# Patient Record
Sex: Male | Born: 1975 | Race: Black or African American | Hispanic: No | Marital: Single | Smoking: Current every day smoker
Health system: Southern US, Community
[De-identification: ages and names within clinical notes are randomized; demographics above are authoritative.]

## PROBLEM LIST (undated history)

## (undated) DIAGNOSIS — F209 Schizophrenia, unspecified: Secondary | ICD-10-CM

## (undated) DIAGNOSIS — K5190 Ulcerative colitis, unspecified, without complications: Secondary | ICD-10-CM

## (undated) DIAGNOSIS — F419 Anxiety disorder, unspecified: Secondary | ICD-10-CM

## (undated) DIAGNOSIS — M869 Osteomyelitis, unspecified: Secondary | ICD-10-CM

## (undated) DIAGNOSIS — D649 Anemia, unspecified: Secondary | ICD-10-CM

## (undated) HISTORY — DX: Ulcerative colitis, unspecified, without complications: K51.90

## (undated) HISTORY — DX: Anxiety disorder, unspecified: F41.9

## (undated) HISTORY — DX: Anemia, unspecified: D64.9

---

## 2001-10-07 HISTORY — PX: TOTAL COLECTOMY: SHX852

## 2005-04-15 ENCOUNTER — Other Ambulatory Visit: Payer: Self-pay

## 2005-04-15 ENCOUNTER — Emergency Department: Payer: Self-pay | Admitting: Internal Medicine

## 2008-08-04 ENCOUNTER — Emergency Department: Payer: Self-pay | Admitting: Emergency Medicine

## 2014-10-07 HISTORY — PX: SMALL INTESTINE SURGERY: SHX150

## 2015-01-13 DIAGNOSIS — R000 Tachycardia, unspecified: Secondary | ICD-10-CM

## 2015-01-13 DIAGNOSIS — F203 Undifferentiated schizophrenia: Secondary | ICD-10-CM | POA: Insufficient documentation

## 2015-01-13 DIAGNOSIS — K5100 Ulcerative (chronic) pancolitis without complications: Secondary | ICD-10-CM | POA: Insufficient documentation

## 2015-01-13 HISTORY — DX: Tachycardia, unspecified: R00.0

## 2016-02-11 ENCOUNTER — Inpatient Hospital Stay
Admit: 2016-02-11 | Discharge: 2016-02-16 | DRG: 885 | Disposition: A | Discharge status: Home / Self Care | Payer: Medicaid Other | Source: Ambulatory Visit | Attending: Psychiatry | Admitting: Psychiatry

## 2016-02-11 ENCOUNTER — Encounter: Payer: Self-pay | Admitting: *Deleted

## 2016-02-11 ENCOUNTER — Emergency Department
Admission: EM | Admit: 2016-02-11 | Discharge: 2016-02-11 | Disposition: A | Discharge status: Other Acute Inpatient Hospital | Payer: Medicaid Other | Attending: Emergency Medicine | Admitting: Emergency Medicine

## 2016-02-11 DIAGNOSIS — R45850 Homicidal ideations: Secondary | ICD-10-CM | POA: Diagnosis not present

## 2016-02-11 DIAGNOSIS — F17210 Nicotine dependence, cigarettes, uncomplicated: Secondary | ICD-10-CM | POA: Diagnosis present

## 2016-02-11 DIAGNOSIS — F250 Schizoaffective disorder, bipolar type: Secondary | ICD-10-CM | POA: Diagnosis present

## 2016-02-11 DIAGNOSIS — F918 Other conduct disorders: Secondary | ICD-10-CM | POA: Insufficient documentation

## 2016-02-11 DIAGNOSIS — F17200 Nicotine dependence, unspecified, uncomplicated: Secondary | ICD-10-CM | POA: Insufficient documentation

## 2016-02-11 DIAGNOSIS — R000 Tachycardia, unspecified: Secondary | ICD-10-CM | POA: Diagnosis not present

## 2016-02-11 DIAGNOSIS — R451 Restlessness and agitation: Secondary | ICD-10-CM | POA: Diagnosis present

## 2016-02-11 DIAGNOSIS — R4689 Other symptoms and signs involving appearance and behavior: Secondary | ICD-10-CM

## 2016-02-11 LAB — CBC
HCT: 46.7 % (ref 40.0–52.0)
HEMOGLOBIN: 15.6 g/dL (ref 13.0–18.0)
MCH: 30.1 pg (ref 26.0–34.0)
MCHC: 33.4 g/dL (ref 32.0–36.0)
MCV: 90.3 fL (ref 80.0–100.0)
PLATELETS: 201 10*3/uL (ref 150–440)
RBC: 5.17 MIL/uL (ref 4.40–5.90)
RDW: 13.1 % (ref 11.5–14.5)
WBC: 8.6 10*3/uL (ref 3.8–10.6)

## 2016-02-11 LAB — COMPREHENSIVE METABOLIC PANEL
ALBUMIN: 3.8 g/dL (ref 3.5–5.0)
ALK PHOS: 66 U/L (ref 38–126)
ALT: 17 U/L (ref 17–63)
ANION GAP: 8 (ref 5–15)
AST: 20 U/L (ref 15–41)
BUN: 10 mg/dL (ref 6–20)
CALCIUM: 9 mg/dL (ref 8.9–10.3)
CHLORIDE: 105 mmol/L (ref 101–111)
CO2: 27 mmol/L (ref 22–32)
CREATININE: 1.01 mg/dL (ref 0.61–1.24)
GFR calc Af Amer: >60 mL/min (ref >60)
GFR calc non Af Amer: >60 mL/min (ref >60)
GLUCOSE: 114 mg/dL — AB (ref 65–99)
Potassium: 3.7 mmol/L (ref 3.5–5.1)
SODIUM: 140 mmol/L (ref 135–145)
Total Bilirubin: 1 mg/dL (ref 0.3–1.2)
Total Protein: 7.9 g/dL (ref 6.5–8.1)

## 2016-02-11 LAB — ACETAMINOPHEN LEVEL: Acetaminophen (Tylenol), Serum: <10 ug/mL — ABNORMAL LOW (ref 10–30)

## 2016-02-11 LAB — URINE DRUG SCREEN, QUALITATIVE (ARMC ONLY)
Amphetamines, Ur Screen: NOT DETECTED
Barbiturates, Ur Screen: NOT DETECTED
Benzodiazepine, Ur Scrn: NOT DETECTED
Cannabinoid 50 Ng, Ur ~~LOC~~: NOT DETECTED
Cocaine Metabolite,Ur ~~LOC~~: NOT DETECTED
MDMA (Ecstasy)Ur Screen: NOT DETECTED
Methadone Scn, Ur: NOT DETECTED
Opiate, Ur Screen: NOT DETECTED
Phencyclidine (PCP) Ur S: NOT DETECTED
Tricyclic, Ur Screen: NOT DETECTED

## 2016-02-11 LAB — SALICYLATE LEVEL: Salicylate Lvl: <4 mg/dL (ref 2.8–30.0)

## 2016-02-11 LAB — ETHANOL: Alcohol, Ethyl (B): <5 mg/dL (ref <5)

## 2016-02-11 MED ORDER — TRAZODONE HCL 100 MG PO TABS
100.0000 mg | ORAL_TABLET | Freq: Every day | ORAL | Status: DC
  Administered 2016-02-11 – 2016-02-15 (×5): 100 mg via ORAL
  Filled 2016-02-11 (×5): qty 1

## 2016-02-11 MED ORDER — ARIPIPRAZOLE ER 400 MG IM SUSR: 400.0000 mg | INTRAMUSCULAR | Status: DC

## 2016-02-11 MED ORDER — PROPRANOLOL HCL 20 MG PO TABS
20.0000 mg | ORAL_TABLET | Freq: Two times a day (BID) | ORAL | Status: DC
  Administered 2016-02-11 – 2016-02-16 (×10): 20 mg via ORAL
  Filled 2016-02-11 (×10): qty 1

## 2016-02-11 MED ORDER — ACETAMINOPHEN 325 MG PO TABS: 650.0000 mg | ORAL_TABLET | Freq: Four times a day (QID) | ORAL | Status: DC | PRN

## 2016-02-11 MED ORDER — BENZTROPINE MESYLATE 1 MG PO TABS
2.0000 mg | ORAL_TABLET | Freq: Every day | ORAL | Status: DC
  Administered 2016-02-11 – 2016-02-12 (×2): 2 mg via ORAL
  Filled 2016-02-11 (×2): qty 2

## 2016-02-11 MED ORDER — ALUM & MAG HYDROXIDE-SIMETH 200-200-20 MG/5ML PO SUSP: 30.0000 mL | ORAL | Status: DC | PRN

## 2016-02-11 MED ORDER — ARIPIPRAZOLE 10 MG PO TABS
10.0000 mg | ORAL_TABLET | Freq: Every day | ORAL | Status: DC
  Administered 2016-02-11 – 2016-02-13 (×3): 10 mg via ORAL
  Filled 2016-02-11 (×3): qty 1

## 2016-02-11 MED ORDER — MAGNESIUM HYDROXIDE 400 MG/5ML PO SUSP: 30.0000 mL | Freq: Every day | ORAL | Status: DC | PRN

## 2016-02-11 NOTE — ED Notes (Signed)
D: Pt denies SI/HI/AVH. Pt is pleasant and cooperative. Pt stated he was ok for now. Pt observed sitting in room watching TV.   A: Pt was offered support and encouragement.Q 15 minute checks were done for safety.  R: Pt has no complaints.Pt receptive to treatment and safety maintained on unit.

## 2016-02-11 NOTE — Tx Team (Signed)
Initial Interdisciplinary Treatment Plan   PATIENT STRESSORS: being here.    PATIENT STRENGTHS: Curator fund of knowledge Physical Health   PROBLEM LIST: Problem List/Patient Goals Date to be addressed Date deferred Reason deferred Estimated date of resolution  Agression 02-11-16           " I don't know why I am here" 02-11-16                                          DISCHARGE CRITERIA:  Improved stabilization in mood, thinking, and/or behavior Motivation to continue treatment in a less acute level of care  PRELIMINARY DISCHARGE PLAN: Outpatient therapy  PATIENT/FAMIILY INVOLVEMENT: This treatment plan has been presented to and reviewed with the patient, Christian Lamb, and/or family member, .  The patient and family have been given the opportunity to ask questions and make suggestions.  Amie [place] 02/11/2016, 11:01 PM

## 2016-02-11 NOTE — ED Notes (Signed)
[place] [phone]

## 2016-02-11 NOTE — Consult Note (Signed)
[facility] Consult   Reason for Consult:  Aggressive behavior. Referring Physician:  Dr. Mariea Clonts Patient Identification: Christian Lamb MRN:  [id number] Principal Diagnosis: Schizoaffective disorder, bipolar type [facility]) Diagnosis:   Patient Active Problem List   Diagnosis Date Noted  . Schizoaffective disorder, bipolar type ([place]) [F25.0] 02/11/2016    Total Time spent with patient: 1 hour  Subjective:    Christian Lamb is a 40 y.o. male patient admitted with aggressive behavior.  Identifying data. Christian Lamb is a 40 year old male with a history of schizophrenia.  Chief complaint. "I don't know what I'm doing here."  History of present illness. Information was obtained from the patient and the chart. The patient has a history of schizophrenia for the last 20. He has been living with the same caregiver for all the time. His caregiver is also his payee. It is unclear whether this is in private support group home setting. The patient reports good compliance with medications prescribed by [facility] in [place]. He has been maintained on injections of Abilify Maintena with Cogentin and propranolol. His Abilify injection was 2 weeks ago. The patient denies any problems at home or with the caregiver. However reported that the patient was threatening, wanted him dead unless he is allowed to drive her car. The patient says that he has been helping his To take care of his mother and was supposed to be allowed to drive a car. The patient himself denies any symptoms of depression, anxiety, or psychosis. He denies symptoms suggestive of bipolar mania. He is not suicidal or homicidal. On interview however the patient is rather restless, has pressured speech and disorganized thinking suggestive of hypomanic episode. He denies alcohol or illicit substance use. He was negative for substances on admission.  Past psychiatric history. He was reportedly hospitalized once and diagnosed  with schizophrenia. He denies ever attempting suicide.  Family psychiatric history. His mother had mental illness.  Social history. He is disabled from mental illness. He lives with his caregiver.  Risk to Self: Suicidal Ideation: No Suicidal Intent: No Is patient at risk for suicide?: No Suicidal Plan?: No Access to Means: No What has been your use of drugs/alcohol within the last 12 months?: NA How many times?: 0 Other Self Harm Risks: NA Triggers for Past Attempts: None known Intentional Self Injurious Behavior: None Risk to Others: Homicidal Ideation: No Thoughts of Harm to Others: No Current Homicidal Intent: No Current Homicidal Plan: No Access to Homicidal Means: No Identified Victim: NA History of harm to others?: No Assessment of Violence: None Noted Violent Behavior Description: Calm and cooperative Does patient have access to weapons?: No Criminal Charges Pending?: No Does patient have a court date: No Prior Inpatient Therapy: Prior Inpatient Therapy: Yes Prior Therapy Dates: Unable to remember Prior Therapy Facilty/Provider(s): Patient is unable to remember Reason for Treatment: Patient is unable to remember Prior Outpatient Therapy: Prior Outpatient Therapy: Yes Prior Therapy Dates: Patient is unable to remember Prior Therapy Facilty/Provider(s): Patient is unable to remember Reason for Treatment: Patienr is unable to remember Does patient have an ACCT team?: No Does patient have Intensive In-House Services?  : No Does patient have [facility] services? : No Does patient have P4CC services?: No  Past Medical History: History reviewed. No pertinent past medical history. History reviewed. No pertinent past surgical history. Family History: History reviewed. No pertinent family history. Family Psychiatric  History: Mother with mental illness. Social History:  History  Alcohol Use: Not on file  History  Drug Use Not on file    Social History   Social  History  . Marital Status: Single    Spouse Name: N/A  . Number of Children: N/A  . Years of Education: N/A   Social History Main Topics  . Smoking status: None  . Smokeless tobacco: None  . Alcohol Use: None  . Drug Use: None  . Sexual Activity: Not Asked   Other Topics Concern  . None   Social History Narrative  . None   Additional Social History:    Allergies:  No Known Allergies  Labs:  Results for orders placed or performed during the hospital encounter of 02/11/16 (from the past 48 hour(s))  Comprehensive metabolic panel     Status: Abnormal   Collection Time: 02/11/16  2:50 PM  Result Value Ref Range   Sodium 140 135 - 145 mmol/L   Potassium 3.7 3.5 - 5.1 mmol/L   Chloride 105 101 - 111 mmol/L   CO2 27 22 - 32 mmol/L   Glucose, Bld 114 (H) 65 - 99 mg/dL   BUN 10 6 - 20 mg/dL   Creatinine, Ser 1.01 0.61 - 1.24 mg/dL   Calcium 9.0 8.9 - 10.3 mg/dL   Total Protein 7.9 6.5 - 8.1 g/dL   Albumin 3.8 3.5 - 5.0 g/dL   AST 20 15 - 41 U/L   ALT 17 17 - 63 U/L   Alkaline Phosphatase 66 38 - 126 U/L   Total Bilirubin 1.0 0.3 - 1.2 mg/dL   GFR calc non Af Amer >60 >60 mL/min   GFR calc Af Amer >60 >60 mL/min    Comment: (NOTE) The eGFR has been calculated using the CKD EPI equation. This calculation has not been validated in all clinical situations. eGFR's persistently <60 mL/min signify possible Chronic Kidney Disease.    Anion gap 8 5 - 15  Ethanol     Status: None   Collection Time: 02/11/16  2:50 PM  Result Value Ref Range   Alcohol, Ethyl (B) <5 <5 mg/dL    Comment:        LOWEST DETECTABLE LIMIT FOR SERUM ALCOHOL IS 5 mg/dL FOR MEDICAL PURPOSES ONLY   Salicylate level     Status: None   Collection Time: 02/11/16  2:50 PM  Result Value Ref Range   Salicylate Lvl <6.5 2.8 - 30.0 mg/dL  Acetaminophen level     Status: Abnormal   Collection Time: 02/11/16  2:50 PM  Result Value Ref Range   Acetaminophen (Tylenol), Serum <10 (L) 10 - 30 ug/mL     Comment:        THERAPEUTIC CONCENTRATIONS VARY SIGNIFICANTLY. A RANGE OF 10-30 ug/mL MAY BE AN EFFECTIVE CONCENTRATION FOR MANY PATIENTS. HOWEVER, SOME ARE BEST TREATED AT CONCENTRATIONS OUTSIDE THIS RANGE. ACETAMINOPHEN CONCENTRATIONS >150 ug/mL AT 4 HOURS AFTER INGESTION AND >50 ug/mL AT 12 HOURS AFTER INGESTION ARE OFTEN ASSOCIATED WITH TOXIC REACTIONS.   cbc     Status: None   Collection Time: 02/11/16  2:50 PM  Result Value Ref Range   WBC 8.6 3.8 - 10.6 K/uL   RBC 5.17 4.40 - 5.90 MIL/uL   Hemoglobin 15.6 13.0 - 18.0 g/dL   HCT 46.7 40.0 - 52.0 %   MCV 90.3 80.0 - 100.0 fL   MCH 30.1 26.0 - 34.0 pg   MCHC 33.4 32.0 - 36.0 g/dL   RDW 13.1 11.5 - 14.5 %   Platelets 201 150 - 440 K/uL  Urine  Drug Screen, Qualitative     Status: None   Collection Time: 02/11/16  2:50 PM  Result Value Ref Range   Tricyclic, Ur Screen NONE DETECTED NONE DETECTED   Amphetamines, Ur Screen NONE DETECTED NONE DETECTED   MDMA (Ecstasy)Ur Screen NONE DETECTED NONE DETECTED   Cocaine Metabolite,Ur [place] NONE DETECTED NONE DETECTED   Opiate, Ur Screen NONE DETECTED NONE DETECTED   Phencyclidine (PCP) Ur S NONE DETECTED NONE DETECTED   Cannabinoid 50 Ng, Ur [place] NONE DETECTED NONE DETECTED   Barbiturates, Ur Screen NONE DETECTED NONE DETECTED   Benzodiazepine, Ur Scrn NONE DETECTED NONE DETECTED   Methadone Scn, Ur NONE DETECTED NONE DETECTED    Comment: (NOTE) 681  Tricyclics, urine               Cutoff 1000 ng/mL 200  Amphetamines, urine             Cutoff 1000 ng/mL 300  MDMA (Ecstasy), urine           Cutoff 500 ng/mL 400  Cocaine Metabolite, urine       Cutoff 300 ng/mL 500  Opiate, urine                   Cutoff 300 ng/mL 600  Phencyclidine (PCP), urine      Cutoff 25 ng/mL 700  Cannabinoid, urine              Cutoff 50 ng/mL 800  Barbiturates, urine             Cutoff 200 ng/mL 900  Benzodiazepine, urine           Cutoff 200 ng/mL 1000 Methadone, urine                Cutoff 300  ng/mL 1100 1200 The urine drug screen provides only a preliminary, unconfirmed 1300 analytical test result and should not be used for non-medical 1400 purposes. Clinical consideration and professional judgment should 1500 be applied to any positive drug screen result due to possible 1600 interfering substances. A more specific alternate chemical method 1700 must be used in order to obtain a confirmed analytical result.  1800 Gas chromato graphy / mass spectrometry (GC/MS) is the preferred 1900 confirmatory method.     No current facility-administered medications for this encounter.   No current outpatient prescriptions on file.    Musculoskeletal: Strength & Muscle Tone: within normal limits Gait & Station: normal Patient leans: N/A  Psychiatric Specialty Exam: I reviewed physical exam performed in the emergency room and agree with the findings Review of Systems  All other systems reviewed and are negative.   Blood pressure 140/90, pulse 90, temperature 98.2 F (36.8 C), temperature source Oral, resp. rate 18, height 6' (1.829 m), weight 61.689 kg (136 lb), SpO2 100 %.Body mass index is 18.44 kg/(m^2).  General Appearance: Disheveled  Eye Sport and exercise psychologist::  Fair  Speech:  Pressured  Volume:  Increased  Mood:  Dysphoric  Affect:  Labile  Thought Process:  Disorganized  Orientation:  Full (Time, Place, and Person)  Thought Content:  Delusions and Paranoid Ideation  Suicidal Thoughts:  No  Homicidal Thoughts:  No  Memory:  Immediate;   Fair Recent;   Fair Remote;   Fair  Judgement:  Poor  Insight:  Lacking  Psychomotor Activity:  Increased  Concentration:  Fair  Recall:  [place]  Language: Fair  Akathisia:  No  Handed:  Right  AIMS (if indicated):  Assets:  Communication Skills Desire for Improvement Financial Resources/Insurance Housing Physical Health Resilience Social Support  ADL's:  Intact  Cognition: WNL  Sleep:      Treatment Plan  Summary: Daily contact with patient to assess and evaluate symptoms and progress in treatment and Medication management   Christian Lamb is a 40 year old male with history of schizophrenia brought to the hospital by his caregiver for threatening aggressive behavior in what appears to be a manic episode.  PLAN:  1. Will admit the patient to behavioral medicine when bed available.  2. I will start oral Abilify, Cogentin, and propranolol.  Disposition: Recommend psychiatric Inpatient admission when medically cleared. Supportive therapy provided about ongoing stressors. Discussed crisis plan, support from social network, calling 911, coming to the Emergency Department, and calling Suicide Hotline.  Orson Slick, MD 02/11/2016 7:37 PM

## 2016-02-11 NOTE — BH Specialist Note (Signed)
Writer attempted to obtain collateral information from the care giver at the group home without success.  Writer left a voice mail message for the patient at [phone] and [phone].

## 2016-02-11 NOTE — BH Assessment (Addendum)
Assessment Note  Christian Lamb is an 40 y.o. male that resides in a group home.  Patient was dropped off by his caregiver due aggressive behavior.  Patient denies SI/HI/Psychosis/Substance Abuse.  Patient reports previous inpatient psychiatric hospitalization but cannot remember when or the name of the facilities. Writer was unable to obtain collateral information from the group home.  Writer left voice mail messages with the numbers listed in the epic chart.    Patient was cam and cooperative during the assessment.  Patient denies making any statement that he was going to harm anyone.  Patient denies harming anyone. Patient denies physical, sexual or emotional abuse.   Diagnosis: Schizophrenia  Past Medical History: History reviewed. No pertinent past medical history.  No past surgical history on file.  Family History: History reviewed. No pertinent family history.  Social History:  has no tobacco, alcohol, and drug history on file.  Additional Social History:  Alcohol / Drug Use History of alcohol / drug use?: No history of alcohol / drug abuse  CIWA: CIWA-Ar BP: 140/90 mmHg Pulse Rate: 90 COWS:    Allergies: No Known Allergies  Home Medications:  (Not in a hospital admission)  OB/GYN Status:  No LMP for male patient.  General Assessment Data Location of Assessment: [facility] ED TTS Assessment: In system Is this a Tele or Face-to-Face Assessment?: Face-to-Face Is this an Initial Assessment or a Re-assessment for this encounter?: Initial Assessment Marital status: Single Maiden name: NA Is patient pregnant?: No Pregnancy Status: No Living Arrangements: Group Home Can pt return to current living arrangement?: Yes Admission Status: Involuntary Is patient capable of signing voluntary admission?: No Referral Source: Other (His payee [name]) Insurance type: Medicaid     Crisis Care Plan Living Arrangements: Group Home Legal Guardian: Other: Name of  Psychiatrist: [place] Name of Therapist: Group Home - Therapist  Education Status Is patient currently in school?: No Current Grade: NA Highest grade of school patient has completed: NA Name of school: NA Contact person: NA  Risk to self with the past 6 months Suicidal Ideation: No Has patient been a risk to self within the past 6 months prior to admission? : No Suicidal Intent: No Has patient had any suicidal intent within the past 6 months prior to admission? : No Is patient at risk for suicide?: No Suicidal Plan?: No Has patient had any suicidal plan within the past 6 months prior to admission? : No Access to Means: No What has been your use of drugs/alcohol within the last 12 months?: NA Previous Attempts/Gestures: No How many times?: 0 Other Self Harm Risks: NA Triggers for Past Attempts: None known Intentional Self Injurious Behavior: None Family Suicide History: No Recent stressful life event(s):  (None Reported) Persecutory voices/beliefs?: No Depression: No Depression Symptoms:  (None Reported) Substance abuse history and/or treatment for substance abuse?: No Suicide prevention information given to non-admitted patients: Not applicable  Risk to Others within the past 6 months Homicidal Ideation: No Does patient have any lifetime risk of violence toward others beyond the six months prior to admission? : No Thoughts of Harm to Others: No Current Homicidal Intent: No Current Homicidal Plan: No Access to Homicidal Means: No Identified Victim: NA History of harm to others?: No Assessment of Violence: None Noted Violent Behavior Description: Calm and cooperative Does patient have access to weapons?: No Criminal Charges Pending?: No Does patient have a court date: No Is patient on probation?: No  Psychosis Hallucinations: None noted Delusions: None  noted  Mental Status Report Appearance/Hygiene: Disheveled Eye Contact: Good Motor Activity:  Freedom of movement Speech: Logical/coherent Level of Consciousness: Alert Mood: Anxious Affect: Appropriate to circumstance Anxiety Level: Minimal Thought Processes: Relevant, Coherent Judgement: Unimpaired Orientation: Person, Time, Place, Situation Obsessive Compulsive Thoughts/Behaviors: None  Cognitive Functioning Concentration: Decreased Memory: Recent Intact, Remote Intact IQ: Average Insight: Fair Impulse Control: Fair Appetite: Fair Weight Loss: 0 Weight Gain: 0 Sleep: Decreased Total Hours of Sleep: 5 Vegetative Symptoms: None  ADLScreening [facility] Assessment Services) Patient's cognitive ability adequate to safely complete daily activities?: Yes Patient able to express need for assistance with ADLs?: Yes Independently performs ADLs?: Yes (appropriate for developmental age)  Prior Inpatient Therapy Prior Inpatient Therapy: Yes Prior Therapy Dates: Unable to remember Prior Therapy Facilty/Provider(s): Patient is unable to remember Reason for Treatment: Patient is unable to remember  Prior Outpatient Therapy Prior Outpatient Therapy: Yes Prior Therapy Dates: Patient is unable to remember Prior Therapy Facilty/Provider(s): Patient is unable to remember Reason for Treatment: Patienr is unable to remember Does patient have an ACCT team?: No Does patient have Intensive In-House Services?  : No Does patient have [facility] services? : No Does patient have P4CC services?: No  ADL Screening (condition at time of admission) Patient's cognitive ability adequate to safely complete daily activities?: Yes Is the patient deaf or have difficulty hearing?: No Does the patient have difficulty seeing, even when wearing glasses/contacts?: No Does the patient have difficulty concentrating, remembering, or making decisions?: Yes Patient able to express need for assistance with ADLs?: Yes Does the patient have difficulty dressing or bathing?: No Independently performs ADLs?: Yes  (appropriate for developmental age) Does the patient have difficulty walking or climbing stairs?: No Weakness of Legs: None Weakness of Arms/Hands: None  Home Assistive Devices/Equipment Home Assistive Devices/Equipment: None    Abuse/Neglect Assessment (Assessment to be complete while patient is alone) Physical Abuse: Denies Verbal Abuse: Denies Sexual Abuse: Denies Exploitation of patient/patient's resources: Denies Self-Neglect: Denies Values / Beliefs Cultural Requests During Hospitalization: None Spiritual Requests During Hospitalization: None Consults Spiritual Care Consult Needed: No Social Work Consult Needed: No Regulatory affairs officer (For Healthcare) Does patient have an advance directive?: No Would patient like information on creating an advanced directive?: No - patient declined information Type of Advance Directive: Healthcare Power of Attorney Copy of advanced directive(s) in chart?: No - copy requested    Additional Information 1:1 In Past 12 Months?: No CIRT Risk: No Elopement Risk: No Does patient have medical clearance?: No     Disposition: Pending psych disposition.  Disposition Initial Assessment Completed for this Encounter: Yes  On Site Evaluation by:   Reviewed with Physician:    Graciella Freer [place] 02/11/2016 4:33 PM

## 2016-02-11 NOTE — BH Assessment (Signed)
Writer received a call from [phone] this is not a valid phone number for [employer]

## 2016-02-11 NOTE — ED Notes (Addendum)
Caregiver states pt has been demonstrating aggressive behavior, states he told him he wanted him dead, states he wants his driving license and a car and gets mad when he cant, denies SI or HI

## 2016-02-11 NOTE — BH Assessment (Addendum)
Dr. Bary Leriche called to state that once Behavioral Unit had a bed downstairs she would like the patient to be admitted for services   Pt accept to Dr. Lemmie Evens to Bed 307

## 2016-02-11 NOTE — ED Provider Notes (Signed)
[facility] Emergency Department Provider Note  ____________________________________________  Time seen: Approximately 3:18 PM  I have reviewed the triage vital signs and the nursing notes.   HISTORY  Chief Complaint Aggressive Behavior    HPI Christian Lamb is a 40 y.o. male with a history of schizophrenia brought by his caregiver in the group home for aggressive behavior. The patient denies saying anything that would be threatening, and states "I was just sitting in my room and having a conversation.". Per report, the patient told his caregiver that he wanted him dead and that the patient is angry because he cannot have his driver's license her car. At this time, the patient is denying any SI, HI or hallucinations. The patient does not have any acute medical complaints.   History reviewed. No pertinent past medical history.  There are no active problems to display for this patient.   No past surgical history on file.  No current outpatient prescriptions on file.  Allergies Review of patient's allergies indicates no known allergies.  History reviewed. No pertinent family history.  Social History Social History  Substance Use Topics  . Smoking status: None  . Smokeless tobacco: None  . Alcohol Use: None    Review of Systems Constitutional: No fever/chills.No lightheadedness or syncope. Eyes: No visual changes. ENT: No sore throat. No congestion or rhinorrhea. Cardiovascular: Denies chest pain. Denies palpitations. Respiratory: Denies shortness of breath.  No cough. Gastrointestinal: No abdominal pain.  No nausea, no vomiting.  No diarrhea.  No constipation. Genitourinary: Negative for dysuria. Musculoskeletal: Negative for back pain. Skin: Negative for rash. Neurological: Negative for headaches. No focal numbness, tingling or weakness.  Psychiatric:Positive aggressive behavior. Patient denies any SI, HI or hallucinations.  10-point  ROS otherwise negative.  ____________________________________________   PHYSICAL EXAM:  VITAL SIGNS: ED Triage Vitals  Enc Vitals Group     BP 02/11/16 1447 140/90 mmHg     Pulse Rate 02/11/16 1447 90     Resp 02/11/16 1447 18     Temp 02/11/16 1447 98.2 F (36.8 C)     Temp Source 02/11/16 1447 Oral     SpO2 02/11/16 1447 100 %     Weight 02/11/16 1447 136 lb (61.689 kg)     Height 02/11/16 1447 6' (1.829 m)     Head Cir --      Peak Flow --      Pain Score --      Pain Loc --      Pain Edu? --      Excl. in [place]? --     Constitutional: Alert and oriented. Well appearing and in no acute distress. Answers questions appropriately. Eyes: Conjunctivae are normal.  EOMI. No scleral icterus. Head: Atraumatic. Nose: No congestion/rhinnorhea. Mouth/Throat: Mucous membranes are moist.  Neck: No stridor.  Supple.   Cardiovascular: Normal rate, regular rhythm. No murmurs, rubs or gallops.  Respiratory: Normal respiratory effort.  No accessory muscle use or retractions. Lungs CTAB.  No wheezes, rales or ronchi. Gastrointestinal: Soft, nontender and nondistended.  No guarding or rebound.  No peritoneal signs. Musculoskeletal: No LE edema.  Neurologic:  A&Ox3.  Speech is clear.  Face and smile are symmetric.  EOMI.  Moves all extremities well. Skin:  Skin is warm, dry and intact. No rash noted. Psychiatric: At this time, the patient is calm and answering questions appropriately. He is denying any SI, HI or hallucinations. He does not have insight into what occurred to bring him here. The  patient makes fair eye contact and does not exhibit any pressured or paranoid speech.  ____________________________________________   LABS (all labs ordered are listed, but only abnormal results are displayed)  Labs Reviewed  COMPREHENSIVE METABOLIC PANEL - Abnormal; Notable for the following:    Glucose, Bld 114 (*)    All other components within normal limits  ACETAMINOPHEN LEVEL - Abnormal;  Notable for the following:    Acetaminophen (Tylenol), Serum <10 (*)    All other components within normal limits  ETHANOL  SALICYLATE LEVEL  CBC  URINE DRUG SCREEN, QUALITATIVE ([facility] ONLY)   ____________________________________________  EKG  Not indicated ____________________________________________  RADIOLOGY  No results found.  ____________________________________________   PROCEDURES  Procedure(s) performed: None  Critical Care performed: No ____________________________________________   INITIAL IMPRESSION / ASSESSMENT AND PLAN / ED COURSE  Pertinent labs & imaging results that were available during my care of the patient were reviewed by me and considered in my medical decision making (see chart for details).  40 y.o. male with a history of schizophrenia brought to the emergency department for aggressive behavior and threatening his care giver. At this time, the patient is calm and answer questions appropriately although he is not agreeing to the situation of what happened and his home. On my exam I do not see any acute medical conditions, and we will proceed with psychiatric evaluation and clearance.  ----------------------------------------- 4:05 PM on 02/11/2016 -----------------------------------------  At this time, the patient is medically cleared for psychiatric disposition.  ____________________________________________  FINAL CLINICAL IMPRESSION(S) / ED DIAGNOSES  Final diagnoses:  Aggressive behavior  Homicidal ideations      NEW MEDICATIONS STARTED DURING THIS VISIT:  New Prescriptions   No medications on file     Eula Listen, MD 02/11/16 1605

## 2016-02-11 NOTE — Progress Notes (Signed)
Admission note:  Pt admitted to unit without issue.  Skin check completed. No contraband found. Pt had scar midline abdomen from previous surgery and a scar on upper right arm. Denies SI/AVH. Denies pain. States that he "doesn't know why he is here" He stated his stressor was being here and not knowing how long he will be here. Pt speech was rapid and difficult to understand at times. Cooperative and med compliant. Q15 minute checks maintained for safety. Medications given as prescribed. Pt is currently resting with eyes closed. Will continue to monitor.

## 2016-02-11 NOTE — ED Notes (Signed)
Patient assigned to appropriate care area. Patient oriented to unit/care area: Informed that, for their safety, care areas are designed for safety and monitored by security cameras at all times; and visiting hours explained to patient. Patient verbalizes understanding, and verbal contract for safety obtained.  Pt denies SI/HI and AVH. "My caregiver thought I was gonna hurt myself. I don't wanna do that."  Pt has flight of ideas and pressured speech. Restless. Pleasant and cooperative with Probation officer. Pt doesn't understand why he his caregiver would bring him here after he helped take care of his sick mother before she died. Pt also voiced frustration of not having the car her was promised by his caregiver.   RN informed pt he was in this holding unit to wait to see a psychiatrist. Pt accepting. Maintained on 15 minute checks and observation by security camera for safety.

## 2016-02-12 ENCOUNTER — Encounter: Payer: Self-pay | Admitting: Psychiatry

## 2016-02-12 DIAGNOSIS — F17200 Nicotine dependence, unspecified, uncomplicated: Secondary | ICD-10-CM

## 2016-02-12 LAB — LIPID PANEL
CHOL/HDL RATIO: 3.8 ratio
Cholesterol: 136 mg/dL (ref 0–200)
HDL: 36 mg/dL — AB (ref >40)
LDL CALC: 90 mg/dL (ref 0–99)
Triglycerides: 49 mg/dL (ref <150)
VLDL: 10 mg/dL (ref 0–40)

## 2016-02-12 LAB — HEMOGLOBIN A1C: Hgb A1c MFr Bld: 4.9 % (ref 4.0–6.0)

## 2016-02-12 LAB — TSH: TSH: 0.65 u[IU]/mL (ref 0.350–4.500)

## 2016-02-12 MED ORDER — NICOTINE 21 MG/24HR TD PT24
21.0000 mg | MEDICATED_PATCH | Freq: Every day | TRANSDERMAL | Status: DC
  Administered 2016-02-12 – 2016-02-15 (×4): 21 mg via TRANSDERMAL
  Filled 2016-02-12 (×5): qty 1

## 2016-02-12 MED ORDER — BENZTROPINE MESYLATE 1 MG PO TABS
1.0000 mg | ORAL_TABLET | Freq: Every day | ORAL | Status: DC
  Administered 2016-02-13: 1 mg via ORAL
  Filled 2016-02-12: qty 1

## 2016-02-12 NOTE — Progress Notes (Signed)
D:  Per pt self inventory pt reports sleeping good, appetite good, energy level normal, ability to pay attention good, rates depression at a 0 out of 10, hopelessness at a 0 out of 10, anxiety at a 0 out of 10, denies SI/HI/AVH, anxious, pacing, minimal during interaction.     A:  Emotional support provided, Encouraged pt to continue with treatment plan and attend all group activities, q15 min checks maintained for safety.  R:  Pt is not receptive, minimizing need to be here, not going to groups, otherwise pleasant, med compliant, and cooperative with staff and other patients on the unit.

## 2016-02-12 NOTE — Progress Notes (Signed)
Recreation Therapy Notes  Date: 05.08.17 Time: 9:30 am Location: Craft Room  Group Topic: Self-expression  Goal Area(s) Addresses:  Patient will identify one color per emotion listed on the wheel. Patient will verbalize one emotion experienced during session. Patient will be educated on other forms of self-expression.  Behavioral Response: Attentive, Interactive  Intervention: Emotion Wheel  Activity: Patients were given an Emotion Wheel worksheet and instructed to pick a color for each emotion listed.  Education: LRT educated group on other forms of self-expression.  Education Outcome: In group clarification offered  Clinical Observations/Feedback: Patient completed activity by picking a color for each emotion. Patient contributed to group discussion by stating some of the colors he picked for certain emotions.  Leonette Monarch, LRT/CTRS 02/12/2016 10:19 AM

## 2016-02-12 NOTE — Plan of Care (Signed)
Problem: Consults Goal: [facility] General Treatment Patient Education Outcome: Progressing Patient verbalizes understandng of rules and procedures of the unit CTownsnd RN

## 2016-02-12 NOTE — BHH Group Notes (Signed)
[place] Group Notes:  (Nursing/MHT/Case Management/Adjunct)  Date:  02/12/2016  Time:  4:43 PM  Type of Therapy:  Psychoeducational Skills  Participation Level:  Active  Participation Quality:  Sharing  Affect:  Appropriate  Cognitive:  Appropriate  Insight:  Good  Engagement in Group:  Engaged  Modes of Intervention:  Support  Summary of Progress/Problems:  Nehemiah Settle 02/12/2016, 4:43 PM

## 2016-02-12 NOTE — H&P (Addendum)
Psychiatric Admission Assessment Adult  Patient Identification: Christian Lamb MRN:  [id number] Date of Evaluation:  02/12/2016 Chief Complaint:  Schizopherina Principal Diagnosis: Tobacco use disorder Diagnosis:   Patient Active Problem List   Diagnosis Date Noted  . Tobacco use disorder [F17.200] 02/12/2016  . Schizoaffective disorder, bipolar type ([place]) [F25.0] 02/11/2016   History of Present Illness: Christian Lamb is a 40 y.o. male with diagnosis of schizophrenia. Patient admitted with aggressive behavior.  Per report, the patient told his caregiver that he wanted him dead and that the patient is angry because he cannot have his driver's license her car.  Chief complaint. "I don't know what I'm doing here."  The patient has a history of schizophrenia for the last 20 years. He has been living with the same caregiver for years. His caregiver is also his payee and health care power of attorney. The patient reports good compliance with medications prescribed by [facility] in [place]. He has been maintained on injections of Abilify Maintena for years. Patient is states that he last received the medication on April 27.  During assessment today the patient denies having any problems with mood, appetite, energy is sleep or concentration. He denies suicidality, homicidality or having auditory or visual hallucinations. The patient denies any symptoms consistent with mania or with hypomania.   He reports getting along well with his caregiver. He denies that there were any arguments or fights prior to him coming to the emergency room. He says is unclear to him as to why he was brought in here.  Per the ER report during assessment yesterday in the emergency department the patient was hypomanic as he had pressured speech, was disorganized and was very restless.  Substance abuse history:  denies alcohol or illicit substance use. He was negative for substances on admission. Patient  smokes about half a pack of cigarettes per day.  Trauma history: Denies   Associated Signs/Symptoms: Depression Symptoms:  denies (Hypo) Manic Symptoms:  denies Anxiety Symptoms:  denies Psychotic Symptoms:  denies PTSD Symptoms: Negative Total Time spent with patient: 1 hour  Past Psychiatric History: He was reportedly hospitalized once and diagnosed with schizophrenia. He denies ever attempting suicide. Patient also denies any history of self injury. Says  his last psychiatric hospitalization was years ago    Is the patient at risk to self? Yes.    Has the patient been a risk to self in the past 6 months? No.  Has the patient been a risk to self within the distant past? No.  Is the patient a risk to others? No.  Has the patient been a risk to others in the past 6 months? No.  Has the patient been a risk to others within the distant past? No.    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 Patient reports that last year he had a abdominal obstruction.  Family History: Unknown  Family Psychiatric  History: His mother had mental illness. He denies any history of substance abuse or suicide in his family    Social History: He is disabled from mental illness. He lives with his caregiver. Patient reports that his caregiver is like a cousin.They have been living together for many years. He reports that his caregiver also suffers from mental illness and receives disability. History  Alcohol Use No     History  Drug Use No     Allergies:  No Known Allergies   Lab Results:  Results for orders placed or performed during the hospital encounter of 02/11/16 (from the past 48 hour(s))  Hemoglobin A1c     Status: None   Collection Time: 02/12/16  7:07 AM  Result Value Ref Range   Hgb A1c MFr Bld 4.9 4.0 - 6.0 %  Lipid panel, fasting     Status: Abnormal   Collection Time: 02/12/16  7:07 AM  Result Value Ref  Range   Cholesterol 136 0 - 200 mg/dL   Triglycerides 49 <150 mg/dL   HDL 36 (L) >40 mg/dL   Total CHOL/HDL Ratio 3.8 RATIO   VLDL 10 0 - 40 mg/dL   LDL Cholesterol 90 0 - 99 mg/dL    Comment:        Total Cholesterol/HDL:CHD Risk Coronary Heart Disease Risk Table                     Men   Women  1/2 Average Risk   3.4   3.3  Average Risk       5.0   4.4  2 X Average Risk   9.6   7.1  3 X Average Risk  23.4   11.0        Use the calculated Patient Ratio above and the CHD Risk Table to determine the patient's CHD Risk.        ATP III CLASSIFICATION (LDL):  <100     mg/dL   Optimal  100-129  mg/dL   Near or Above                    Optimal  130-159  mg/dL   Borderline  160-189  mg/dL   High  >190     mg/dL   Very High   TSH     Status: None   Collection Time: 02/12/16  7:07 AM  Result Value Ref Range   TSH 0.650 0.350 - 4.500 uIU/mL    Blood Alcohol level:  Lab Results  Component Value Date   ETH <5 32/35/5732    Metabolic Disorder Labs:  Lab Results  Component Value Date   HGBA1C 4.9 02/12/2016   No results found for: PROLACTIN Lab Results  Component Value Date   CHOL 136 02/12/2016   TRIG 49 02/12/2016   HDL 36* 02/12/2016   CHOLHDL 3.8 02/12/2016   VLDL 10 02/12/2016   LDLCALC 90 02/12/2016    Current Medications: Current Facility-Administered Medications  Medication Dose Route Frequency Provider Last Rate Last Dose  . acetaminophen (TYLENOL) tablet 650 mg  650 mg Oral Q6H PRN Jolanta B Pucilowska, MD      . alum & mag hydroxide-simeth (MAALOX/MYLANTA) 200-200-20 MG/5ML suspension 30 mL  30 mL Oral Q4H PRN Jolanta B Pucilowska, MD      . ARIPiprazole (ABILIFY) tablet 10 mg  10 mg Oral Daily Clovis Fredrickson, MD   10 mg at 02/12/16 0826  . [START ON 02/23/2016] ARIPiprazole SUSR 400 mg  400 mg Intramuscular Q28 days Clovis Fredrickson, MD      . Derrill Memo ON 02/13/2016] benztropine (COGENTIN) tablet 1 mg  1 mg Oral Daily Hildred Priest, MD       . magnesium hydroxide (MILK OF MAGNESIA) suspension 30 mL  30 mL Oral Daily PRN Jolanta B Pucilowska, MD      . nicotine (NICODERM CQ - dosed in mg/24 hours) patch 21 mg  21 mg Transdermal Daily Hildred Priest, MD      . propranolol (  INDERAL) tablet 20 mg  20 mg Oral BID Clovis Fredrickson, MD   20 mg at 02/12/16 0827  . traZODone (DESYREL) tablet 100 mg  100 mg Oral QHS Clovis Fredrickson, MD   100 mg at 02/11/16 2208   PTA Medications: No prescriptions prior to admission    Musculoskeletal: Strength & Muscle Tone: within normal limits Gait & Station: normal Patient leans: N/A  Psychiatric Specialty Exam: Physical Exam  Constitutional: He is oriented to person, place, and time. He appears well-developed and well-nourished.  HENT:  Head: Normocephalic and atraumatic.  Eyes: Conjunctivae and EOM are normal.  Neck: Normal range of motion.  Respiratory: Effort normal.  Musculoskeletal: Normal range of motion.  Neurological: He is alert and oriented to person, place, and time.    Review of Systems  Constitutional: Negative.   HENT: Negative.   Eyes: Negative.   Respiratory: Negative.   Cardiovascular: Negative.   Gastrointestinal: Negative.   Genitourinary: Negative.   Musculoskeletal: Negative.   Skin: Negative.   Neurological: Negative.   Endo/Heme/Allergies: Negative.   Psychiatric/Behavioral: Negative.     Blood pressure 120/82, pulse 83, temperature 98.2 F (36.8 C), temperature source Oral, resp. rate 20, height 6' (1.829 m), weight 61.689 kg (136 lb), SpO2 100 %.Body mass index is 18.44 kg/(m^2).  General Appearance: Fairly Groomed  Engineer, water::  Good  Speech:  Clear and Coherent  Volume:  Normal  Mood:  Euthymic  Affect:  Appropriate  Thought Process:  Linear  Orientation:  Full (Time, Place, and Person)  Thought Content:  Hallucinations: None  Suicidal Thoughts:  No  Homicidal Thoughts:  No  Memory:  Immediate;   Good Recent;   Good Remote;    Good  Judgement:  Fair  Insight:  Fair  Psychomotor Activity:  Normal  Concentration:  Fair  Recall:  [place] of Knowledge:Fair  Language: Fair  Akathisia:  No  Handed:    AIMS (if indicated):     Assets:  Agricultural consultant Housing Social Support  ADL's:  Intact  Cognition: WNL  Sleep:      Treatment Plan Summary:  Schizophrenia the patient will be continued on Abilify 400 mg IM every monthly. He also has been started on Abilify oral 10 mg a day.  EPS the patient will be continued on Inderal 20 mg by mouth twice a day and benztropine 1 mg by mouth daily.  Tobacco use disorder the patient will be started on a nicotine patch 21 mg a day  Precautions every 15 minute checks  Diet regular  Hospitalization and status continue IVC  Will get collateral information from his healthcare power of attorney and caregiver Legrand Como. Judge Stall- POA [phone]  Labs I will order hemoglobin A1c, lipid panel, and a prolactin level  Disposition he'll return back to his home once stable  Follow-up he will continue to follow up with [facility] once a stable  I certify that inpatient services furnished can reasonably be expected to improve the patient's condition.    Hildred Priest, MD 5/8/20174:40 PM

## 2016-02-12 NOTE — Progress Notes (Signed)
D: Patient is alert and oriented on the unit this shift. Patient attended and actively participated in groups today. Patient denies suicidal ideation, homicidal ideation, auditory or visual hallucinations at the present time.  A: Scheduled medications are administered to patient as per MD orders. Emotional support and encouragement are provided. Patient is maintained on q.15 minute safety checks. Patient is informed to notify staff with questions or concerns. R: No adverse medication reactions are noted. Patient is cooperative with medication administration and treatment plan today. Patient is receptive, calm and cooperative on the unit at this time. Patient interacts well with others on the unit this shift. Patient contracts for safety at this time. Patient remains safe at this time.

## 2016-02-12 NOTE — BHH Suicide Risk Assessment (Signed)
[facility] Admission Suicide Risk Assessment   Nursing information obtained from:    Demographic factors:    Current Mental Status:    Loss Factors:    Historical Factors:    Risk Reduction Factors:     Total Time spent with patient: 1 hour Principal Problem: Tobacco use disorder Diagnosis:   Patient Active Problem List   Diagnosis Date Noted  . Tobacco use disorder [F17.200] 02/12/2016  . Schizoaffective disorder, bipolar type ([place]) [F25.0] 02/11/2016   Subjective Data:   Continued Clinical Symptoms:  Alcohol Use Disorder Identification Test Final Score (AUDIT): 1 The "Alcohol Use Disorders Identification Test", Guidelines for Use in Primary Care, Second Edition.  World Pharmacologist [facility]). Score between 0-7:  no or low risk or alcohol related problems. Score between 8-15:  moderate risk of alcohol related problems. Score between 16-19:  high risk of alcohol related problems. Score 20 or above:  warrants further diagnostic evaluation for alcohol dependence and treatment.   CLINICAL FACTORS:   Schizophrenia:   Paranoid or undifferentiated type Previous Psychiatric Diagnoses and Treatments    Psychiatric Specialty Exam: ROS  Blood pressure 120/82, pulse 83, temperature 98.2 F (36.8 C), temperature source Oral, resp. rate 20, height 6' (1.829 m), weight 61.689 kg (136 lb), SpO2 100 %.Body mass index is 18.44 kg/(m^2).   COGNITIVE FEATURES THAT CONTRIBUTE TO RISK:  None    SUICIDE RISK:   Mild:  Suicidal ideation of limited frequency, intensity, duration, and specificity.  There are no identifiable plans, no associated intent, mild dysphoria and related symptoms, good self-control (both objective and subjective assessment), few other risk factors, and identifiable protective factors, including available and accessible social support.  PLAN OF CARE: admit to [facility]  I certify that inpatient services furnished can reasonably be expected to improve the patient's condition.    Hildred Priest, MD 02/12/2016, 4:40 PM

## 2016-02-12 NOTE — BHH Group Notes (Signed)
[place] LCSW Group Therapy  02/12/2016 3:00 PM  Type of Therapy:  Group Therapy  Participation Level:  Active  Participation Quality:  Appropriate and Attentive  Affect:  Appropriate  Cognitive:  Alert, Appropriate and Oriented  Insight:  Improving  Engagement in Therapy:  Improving  Modes of Intervention:  Discussion, Socialization and Support  Summary of Progress/Problems: Patient attended group and participated appropriately introducing himself and sharing during an introductory exercise that if he were an animal, he would be "a Hawk and would fly". Patient was attentive and shared that fear and trust were emotions that were obstacles in his life and was able to relate to other group members who struggled with fear and paranoia.   Keene Breath, MSW, LCSW 02/12/2016, 3:00 PM

## 2016-02-13 DIAGNOSIS — F250 Schizoaffective disorder, bipolar type: Principal | ICD-10-CM

## 2016-02-13 LAB — PROLACTIN: Prolactin: 1.5 ng/mL — ABNORMAL LOW (ref 4.0–15.2)

## 2016-02-13 MED ORDER — LORAZEPAM 0.5 MG PO TABS
0.5000 mg | ORAL_TABLET | Freq: Every day | ORAL | Status: DC
  Administered 2016-02-13 – 2016-02-15 (×3): 0.5 mg via ORAL
  Filled 2016-02-13 (×3): qty 1

## 2016-02-13 MED ORDER — PALIPERIDONE ER 3 MG PO TB24
9.0000 mg | ORAL_TABLET | Freq: Every day | ORAL | Status: DC
  Administered 2016-02-13 – 2016-02-14 (×2): 9 mg via ORAL
  Filled 2016-02-13 (×2): qty 3

## 2016-02-13 MED ORDER — BENZTROPINE MESYLATE 1 MG PO TABS
0.5000 mg | ORAL_TABLET | Freq: Every day | ORAL | Status: DC
  Administered 2016-02-14 – 2016-02-15 (×2): 0.5 mg via ORAL
  Filled 2016-02-13 (×2): qty 1

## 2016-02-13 NOTE — Progress Notes (Signed)
[name] Progress Note  02/13/2016 2:37 PM Christian Lamb  MRN:  [id number] Subjective:  Christian Lamb is a 40 y.o. male with diagnosis of schizophrenia. Patient admitted with aggressive behavior. Per report, the patient told his caregiver that he wanted him dead and that the patient is angry because he cannot have his driver's license her car. The patient has a history of schizophrenia for the last 20 years. He has been living with the same caregiver for years. His caregiver is also his payee and health care power of attorney. The patient reports good compliance with medications prescribed by [facility] in [place]. He has been maintained on injections of Abilify Maintena for years. Patient is states that he last received the medication on April 27.  Pt reports doing well. Denies issues with mood, appetite, energy, sleep, or concentration.  Denies SI, HI or hallucinations.  Denies SE from medications.   Per nursing: Patient is alert and oriented on the unit this shift. Patient attended and actively participated in groups today. Patient denies suicidal ideation, homicidal ideation, auditory or visual hallucinations at the present time.  A: Scheduled medications are administered to patient as per MD orders. Emotional support and encouragement are provided. Patient is maintained on q.15 minute safety checks. Patient is informed to notify staff with questions or concerns. R: No adverse medication reactions are noted. Patient is cooperative with medication administration and treatment plan today. Patient is receptive, calm and cooperative on the unit at this time. Patient interacts well with others on the unit this shift. Patient contracts for safety at this time. Patient remains safe at this time.  Principal Problem: Schizoaffective disorder, bipolar type ([place]) Diagnosis:   Patient Active Problem List   Diagnosis Date Noted  . Tobacco use disorder [F17.200] 02/12/2016  . Schizoaffective  disorder, bipolar type ([place]) [F25.0] 02/11/2016   Total Time spent with patient: 30 minutes  Past Psychiatric History:   Past Medical History: History reviewed. No pertinent past medical history. History reviewed. No pertinent past surgical history.  Family History: History reviewed. No pertinent family history.  Family Psychiatric  History:   Social History:  History  Alcohol Use No     History  Drug Use No    Social History   Social History  . Marital Status: Single    Spouse Name: N/A  . Number of Children: N/A  . Years of Education: N/A   Social History Main Topics  . Smoking status: Current Every Day Smoker  . Smokeless tobacco: Current User     Comment: Pt refused  . Alcohol Use: No  . Drug Use: No  . Sexual Activity: Not Currently   Other Topics Concern  . None   Social History Narrative     Current Medications: Current Facility-Administered Medications  Medication Dose Route Frequency Provider Last Rate Last Dose  . acetaminophen (TYLENOL) tablet 650 mg  650 mg Oral Q6H PRN Jolanta B Pucilowska, MD      . alum & mag hydroxide-simeth (MAALOX/MYLANTA) 200-200-20 MG/5ML suspension 30 mL  30 mL Oral Q4H PRN Jolanta B Pucilowska, MD      . Derrill Memo ON 02/14/2016] benztropine (COGENTIN) tablet 0.5 mg  0.5 mg Oral Daily Hildred Priest, MD      . LORazepam (ATIVAN) tablet 0.5 mg  0.5 mg Oral QHS Hildred Priest, MD      . magnesium hydroxide (MILK OF MAGNESIA) suspension 30 mL  30 mL Oral Daily PRN Clovis Fredrickson, MD      .  nicotine (NICODERM CQ - dosed in mg/24 hours) patch 21 mg  21 mg Transdermal Daily Hildred Priest, MD   21 mg at 02/13/16 0754  . paliperidone (INVEGA) 24 hr tablet 9 mg  9 mg Oral QHS Hildred Priest, MD      . propranolol (INDERAL) tablet 20 mg  20 mg Oral BID Clovis Fredrickson, MD   20 mg at 02/13/16 0757  . traZODone (DESYREL) tablet 100 mg  100 mg Oral QHS Clovis Fredrickson, MD   100 mg at  02/12/16 2156    Lab Results:  Results for orders placed or performed during the hospital encounter of 02/11/16 (from the past 48 hour(s))  Hemoglobin A1c     Status: None   Collection Time: 02/12/16  7:07 AM  Result Value Ref Range   Hgb A1c MFr Bld 4.9 4.0 - 6.0 %  Lipid panel, fasting     Status: Abnormal   Collection Time: 02/12/16  7:07 AM  Result Value Ref Range   Cholesterol 136 0 - 200 mg/dL   Triglycerides 49 <150 mg/dL   HDL 36 (L) >40 mg/dL   Total CHOL/HDL Ratio 3.8 RATIO   VLDL 10 0 - 40 mg/dL   LDL Cholesterol 90 0 - 99 mg/dL    Comment:        Total Cholesterol/HDL:CHD Risk Coronary Heart Disease Risk Table                     Men   Women  1/2 Average Risk   3.4   3.3  Average Risk       5.0   4.4  2 X Average Risk   9.6   7.1  3 X Average Risk  23.4   11.0        Use the calculated Patient Ratio above and the CHD Risk Table to determine the patient's CHD Risk.        ATP III CLASSIFICATION (LDL):  <100     mg/dL   Optimal  100-129  mg/dL   Near or Above                    Optimal  130-159  mg/dL   Borderline  160-189  mg/dL   High  >190     mg/dL   Very High   Prolactin     Status: Abnormal   Collection Time: 02/12/16  7:07 AM  Result Value Ref Range   Prolactin 1.5 (L) 4.0 - 15.2 ng/mL    Comment: (NOTE) Performed At: [facility] [place], [place] [id number] Lindon Romp MD IO:[id number]   TSH     Status: None   Collection Time: 02/12/16  7:07 AM  Result Value Ref Range   TSH 0.650 0.350 - 4.500 uIU/mL    Blood Alcohol level:  Lab Results  Component Value Date   ETH <5 02/11/2016     Musculoskeletal: Strength & Muscle Tone: within normal limits Gait & Station: normal Patient leans: N/A  Psychiatric Specialty Exam: Review of Systems  Constitutional: Negative.   HENT: Negative.   Eyes: Negative.   Respiratory: Negative.   Cardiovascular: Negative.   Gastrointestinal: Negative.   Genitourinary:  Negative.   Musculoskeletal: Negative.   Skin: Negative.   Neurological: Negative.   Endo/Heme/Allergies: Negative.   Psychiatric/Behavioral: Negative.     Blood pressure 113/68, pulse 95, temperature 98.2 F (36.8 C), temperature source Oral, resp. rate 20, height 6' (  1.829 m), weight 61.689 kg (136 lb), SpO2 100 %.Body mass index is 18.44 kg/(m^2).  General Appearance: Disheveled  Eye Contact::  Good  Speech:  Garbled  Volume:  Normal  Mood:  Euthymic  Affect:  Congruent  Thought Process:  concrete  Orientation:  Full (Time, Place, and Person)  Thought Content:  Hallucinations: None  Suicidal Thoughts:  No  Homicidal Thoughts:  No  Memory:  Immediate;   Fair Recent;   Fair Remote;   Fair  Judgement:  Poor  Insight:  Shallow  Psychomotor Activity:  Increased  Concentration:  Fair  Recall:  [place]: Fair  Akathisia:  No  Handed:    AIMS (if indicated):     Assets:  Agricultural consultant Housing  ADL's:  Intact  Cognition: WNL  Sleep:  Number of Hours: 7.45   Treatment Plan Summary:  Schizophrenia: care giver reports pt has not been doing well on abilify inj.  He has been more agitated since started on it. Pt says he has been on prolixin and invega in the past.  He is in agreement with a trial of invega. Will start invega 9 mg qhs and consider the long acting if no SE.   EPS the patient will be continued on Inderal 20 mg by mouth twice a day and benztropine 0.5 mg q day  Tobacco use disorder the patient will be started on a nicotine patch 21 mg a day  Precautions every 15 minute checks  Diet regular  Hospitalization and status continue IVC  Collateral info from care giver, Vonita Moss- POA: not doing well on abilify.  [phone]  Labs I will order hemoglobin A1c, lipid panel, and a prolactin level  Disposition he'll return back to his home once stable   Follow-up he will  continue to follow up with [facility] once a stable  Hildred Priest, MD 02/13/2016, 2:37 PM

## 2016-02-13 NOTE — BHH Group Notes (Signed)
[place] LCSW Group Therapy  02/13/2016 4:01 PM  Type of Therapy:  Group Therapy  Participation Level:  Minimal  Participation Quality:  Attentive  Affect:  Blunted  Cognitive:  Alert and Appropriate  Insight:  Improving  Engagement in Therapy:  Improving  Modes of Intervention:  Socialization and Support  Summary of Progress/Problems: Patient attended and participated in group discussion minimally. Patient introduced himself and shared during an introductory exercise game "Two Truths and a Lie". Patient was attentive throughout group but only shared when asked questions but the facilitator.    Keene Breath, MSW, LCSW 02/13/2016, 4:01 PM

## 2016-02-13 NOTE — BHH Group Notes (Signed)
[facility] LCSW Aftercare Discharge Planning Group Note   02/13/2016 11:35 AM  Participation Quality:   Patient attended and participated in group discussion introducing himself and sharing that his SMART goal is to "leave and do what I'm told to do here". Patient was given a daily workbook on Recovery and ensured patient was informed of LOS, rules for milieu, roles of staff, and ensured patient was aware of their psychiatrist and social worker names.   Mood/Affect:  Flat  Depression Rating:  0  Anxiety Rating:  0  Thoughts of Suicide:  No Will you contract for safety?   NA  Current AVH:  No   Keene Breath, MSW, LCSW

## 2016-02-13 NOTE — BHH Group Notes (Signed)
[place] Group Notes:  (Nursing/MHT/Case Management/Adjunct)  Date:  02/13/2016  Time:  2:25 PM  Type of Therapy:  Psychoeducational Skills  Participation Level:  Active  Participation Quality:  Appropriate  Affect:  Appropriate  Cognitive:  Appropriate  Insight:  Appropriate  Engagement in Group:  Engaged  Modes of Intervention:  Discussion and Education  Summary of Progress/Problems:  Drake Leach 02/13/2016, 2:25 PM

## 2016-02-13 NOTE — Progress Notes (Signed)
Recreation Therapy Notes  Date: 05.09.17 Time: 9:30 am Location: Craft Room  Group Topic: Coping skills  Goal Area(s) Addresses:  Patient will effectively use art as a coping skill. Patient will be able to identify one emotion experienced during group.  Behavioral Response: Attentive  Intervention: Two Faces of Me  Activity: Patients were given a blank face worksheet and instructed to draw or write how they felt when they were admitted to the hospital on one side of the face and draw or write how they wanted to feel when they are d/c on the other side of the face.  Education: LRT educated patients on healthy coping skills.  Education Outcome: In group clarification offered   Clinical Observations/Feedback: Patient drew how he felt when he was admitted to the hospital and how he wanted to feel when he was d/c. Patient did not contribute to group discussion.  Leonette Monarch, LRT/CTRS 02/13/2016 10:24 AM

## 2016-02-13 NOTE — Progress Notes (Signed)
D: Patient stated slept good last night .Stated appetite is good and energy level  Is normal. Stated concentration is good . Stated on Depression scale 0, hopeless 0 and anxiety 0 .( low 0-10 high) Denies suicidal  homicidal ideations  .  No auditory hallucinations  No pain concerns . Appropriate ADL'S. Interacting with peers and staff.Patient stated he was  Withdrawals from Alcohol  Stated his goal today was to do as staff told him. A : Encourage patient participation with unit programming . Instruction  Given on  Medication , verbalize understanding. R: Voice no other concerns. Staff continue to monitor

## 2016-02-14 MED ORDER — PALIPERIDONE PALMITATE 234 MG/1.5ML IM SUSP
234.0000 mg | Freq: Once | INTRAMUSCULAR | Status: AC
  Administered 2016-02-14: 234 mg via INTRAMUSCULAR
  Filled 2016-02-14: qty 1.5

## 2016-02-14 MED ORDER — AMANTADINE HCL 100 MG PO CAPS
100.0000 mg | ORAL_CAPSULE | Freq: Two times a day (BID) | ORAL | Status: DC
Start: 2016-02-14 — End: 2016-02-16
  Administered 2016-02-14 – 2016-02-16 (×5): 100 mg via ORAL
  Filled 2016-02-14 (×5): qty 1

## 2016-02-14 NOTE — Progress Notes (Signed)
D: Patient is alert and oriented on the unit this shift. Patient did not attend  groups today. Patient denies suicidal ideation, homicidal ideation, auditory or visual hallucinations at the present time.  A: Scheduled medications are administered to patient as per MD orders. Emotional support and encouragement are provided. Patient is maintained on q.15 minute safety checks. Patient is informed to notify staff with questions or concerns. R: No adverse medication reactions are noted. Patient is cooperative with medication administration and treatment plan today. Patient is receptive, calm and cooperative on the unit at this time. Patient does not  interact   with others on the unit this shift. Patient contracts for safety at this time. Patient remains safe at this time.

## 2016-02-14 NOTE — BHH Suicide Risk Assessment (Signed)
[place] INPATIENT:  Family/Significant Other Suicide Prevention Education  Suicide Prevention Education:  Education Completed; [name], HPOA,  (name of family member/significant other) has been identified by the patient as the family member/significant other with whom the patient will be residing, and identified as the person(s) who will aid the patient in the event of a mental health crisis (suicidal ideations/suicide attempt).  With written consent from the patient, the family member/significant other has been provided the following suicide prevention education, prior to the and/or following the discharge of the patient.  The suicide prevention education provided includes the following:  Suicide risk factors  Suicide prevention and interventions  National Suicide Hotline telephone number   [facility] assessment telephone number  [facility] Emergency Assistance [place] and/or Residential Mobile Crisis Unit telephone number  Request made of family/significant other to:  Remove weapons (e.g., guns, rifles, knives), all items previously/currently identified as safety concern.    Remove drugs/medications (over-the-counter, prescriptions, illicit drugs), all items previously/currently identified as a safety concern.  The family member/significant other verbalizes understanding of the suicide prevention education information provided.  The family member/significant other agrees to remove the items of safety concern listed above.  August Saucer, MSW, LCSW 02/14/2016, 2:58 PM

## 2016-02-14 NOTE — Tx Team (Signed)
Interdisciplinary Treatment Plan Update (Adult)  Date:  02/14/2016 Time Reviewed:  5:36 PM  Progress in Treatment: Attending groups: Yes. Participating in groups:  Yes. Taking medication as prescribed:  Yes. Tolerating medication:  Yes. Family/Significant othe contact made:  Yes, individual(s) contacted:  Christian Lamb, HPOA Patient understands diagnosis:  Yes. Discussing patient identified problems/goals with staff:  Yes. Medical problems stabilized or resolved:  Yes. Denies suicidal/homicidal ideation: Yes. Issues/concerns per patient self-inventory:  No. Other:  New problem(s) identified: No, Describe:     Discharge Plan or Barriers: Discharge home to caregiver, follow up at [facility] in [place]  Reason for Continuation of Hospitalization: Depression Medication stabilization  Comments:Christian Lamb is a 40 y.o. male with diagnosis of schizophrenia. Patient admitted with aggressive behavior. Per report, the patient told his caregiver that he wanted him dead and that the patient is angry because he cannot have his driver's license her car. The patient has a history of schizophrenia for the last 20 years. He has been living with the same caregiver for years. His caregiver is also his payee and health care power of attorney. The patient reports good compliance with medications prescribed by [facility] in [place]. He reports getting along well with his caregiver. He denies that there were any arguments or fights prior to him coming to the emergency room. He says is unclear to him as to why he was brought in here. Per the ER report during assessment yesterday in the emergency department the patient was hypomanic as he had pressured speech, was disorganized and was very restless.  Estimated length of stay:2 days  New goal(s):  Review of initial/current patient goals per problem list:   1.  Goal(s):Pt will participate in aftercare plan.  Met:  Yes   Target  date:discharge  As evidenced LK:TGYB for housing and psychiatric follow up being identified 02/14/16-Pt will go home with friend and HPOA and follow up at [facility] in [name].  Goal (s):Pt will exhibit decreased depressive symptoms and suicidal ideation.  Met:  No  Target date:Discharge  As evidenced by:Pt will utilize self-rating of depression at 3 or below and demonstrate decreased signs of depression OR be deemed stable by MD.  Attendees: Patient:  [name] 5/10/20175:36 PM  Family:   5/10/20175:36 PM  Physician:  Hernandez 5/10/20175:36 PM  Nursing:   Polly Cobia RN 5/10/20175:36 PM  Case Manager:   5/10/20175:36 PM  Counselor:  Dossie Arbour, LCSW 5/10/20175:36 PM  Other:   5/10/20175:36 PM  Other:   5/10/20175:36 PM  Other:   5/10/20175:36 PM  Other:  5/10/20175:36 PM  Other:  5/10/20175:36 PM  Other:  5/10/20175:36 PM  Other:  5/10/20175:36 PM  Other:  5/10/20175:36 PM  Other:  5/10/20175:36 PM  Other:   5/10/20175:36 PM   Scribe for Treatment Team:   August Saucer, 02/14/2016, 5:36 PM, MSW, LCSW

## 2016-02-14 NOTE — BHH Group Notes (Signed)
[place] LCSW Group Therapy  02/14/2016 2:29 PM  Type of Therapy:  Group Therapy   Summary of Progress/Problems:  Patient attended and participated in group and was attentive during the movie shown "The Happy Movie" which is a documentary about what truly makes people happy and how to measure happiness. Patient was attentive throughout the group session and introduced himself sharing that from the movie he realized that "money doesn't mean happiness but doing things you love and spending time with people you care about".    Keene Breath, MSW, LCSW 02/14/2016, 2:29 PM

## 2016-02-14 NOTE — BHH Group Notes (Signed)
[place] LCSW Group Therapy  02/14/2016 11:09 AM  Type of Therapy:  Group Therapy  Participation Level:  Did Not Attend  Summary of Progress/Problems: Patient was called but did not attend morning group.   Keene Breath, MSW, LCSW 02/14/2016, 11:09 AM

## 2016-02-14 NOTE — Plan of Care (Signed)
Problem: Ineffective individual coping Goal: STG: Patient will remain free from self harm Outcome: Progressing Medications administered as ordered by the physician, medications Therapeutic Effects, SEs and Adverse effects discussed, questions encouraged; no PRN given, 15 minute checks maintained for safety, clinical and moral support provided, patient encouraged to continue to express feelings and demonstrate safe care. Patient remain free from harm, will continue to monitor.         

## 2016-02-14 NOTE — Progress Notes (Signed)
[facility] MD Progress Note  02/14/2016 12:57 PM Christian Lamb  MRN:  [id number] Subjective:  Christian Lamb is a 40 y.o. male with diagnosis of schizophrenia. Patient admitted with aggressive behavior. Per report, the patient told his caregiver that he wanted him dead and that the patient is angry because he cannot have his driver's license her car. The patient has a history of schizophrenia for the last 20 years. He has been living with the same caregiver for years. His caregiver is also his payee and health care power of attorney. The patient reports good compliance with medications prescribed by [facility] in [place]. He has been maintained on injections of Abilify Maintena for years. Patient is states that he last received the medication on April 27.  Pt reports doing well. Denies issues with mood, appetite, energy, sleep, or concentration.  Denies SI, HI or hallucinations.  Denies SE from medications. Feels invega is working well. Denies SE from it.  His care giver reported to [place] yesterday that pt was not doing well on abilify long acting inj.  Pt has been irritable and argumentative. Pt also reports he is getting the inj every 2 weeks instead of every month--clearly showing lack of benefit from that medication.  Per nursing: Awake in room, A&Ox3, denied pain, denied SI/HI, denied AV/H; isolated, slightly irritable on approach, talked about his caregiver and about his Social Security check. Almost rambling and incoherent "when is the snack time?" Will continue to monitor for safety.  Principal Problem: Schizoaffective disorder, bipolar type ([place]) Diagnosis:   Patient Active Problem List   Diagnosis Date Noted  . Tobacco use disorder [F17.200] 02/12/2016  . Schizoaffective disorder, bipolar type ([place]) [F25.0] 02/11/2016   Total Time spent with patient: 30 minutes  Past Psychiatric History:   Past Medical History: History reviewed. No pertinent past medical history. History reviewed.  No pertinent past surgical history.  Family History: History reviewed. No pertinent family history.  Family Psychiatric  History:   Social History:  History  Alcohol Use No     History  Drug Use No    Social History   Social History  . Marital Status: Single    Spouse Name: N/A  . Number of Children: N/A  . Years of Education: N/A   Social History Main Topics  . Smoking status: Current Every Day Smoker  . Smokeless tobacco: Current User     Comment: Pt refused  . Alcohol Use: No  . Drug Use: No  . Sexual Activity: Not Currently   Other Topics Concern  . None   Social History Narrative     Current Medications: Current Facility-Administered Medications  Medication Dose Route Frequency Provider Last Rate Last Dose  . acetaminophen (TYLENOL) tablet 650 mg  650 mg Oral Q6H PRN Jolanta B Pucilowska, MD      . alum & mag hydroxide-simeth (MAALOX/MYLANTA) 200-200-20 MG/5ML suspension 30 mL  30 mL Oral Q4H PRN Jolanta B Pucilowska, MD      . amantadine (SYMMETREL) capsule 100 mg  100 mg Oral BID Hildred Priest, MD      . benztropine (COGENTIN) tablet 0.5 mg  0.5 mg Oral Daily Hildred Priest, MD   0.5 mg at 02/14/16 0805  . LORazepam (ATIVAN) tablet 0.5 mg  0.5 mg Oral QHS Hildred Priest, MD   0.5 mg at 02/13/16 2119  . magnesium hydroxide (MILK OF MAGNESIA) suspension 30 mL  30 mL Oral Daily PRN Clovis Fredrickson, MD      . nicotine (  NICODERM CQ - dosed in mg/24 hours) patch 21 mg  21 mg Transdermal Daily Hildred Priest, MD   21 mg at 02/14/16 0807  . paliperidone (INVEGA SUSTENNA) injection 234 mg  234 mg Intramuscular Once Hildred Priest, MD      . paliperidone (INVEGA) 24 hr tablet 9 mg  9 mg Oral QHS Hildred Priest, MD   9 mg at 02/13/16 2119  . propranolol (INDERAL) tablet 20 mg  20 mg Oral BID Clovis Fredrickson, MD   20 mg at 02/14/16 0806  . traZODone (DESYREL) tablet 100 mg  100 mg Oral QHS Clovis Fredrickson, MD   100 mg at 02/13/16 2119    Lab Results:  No results found for this or any previous visit (from the past 48 hour(s)).  Blood Alcohol level:  Lab Results  Component Value Date   ETH <5 02/11/2016     Musculoskeletal: Strength & Muscle Tone: within normal limits Gait & Station: normal Patient leans: N/A  Psychiatric Specialty Exam: Review of Systems  Constitutional: Negative.   HENT: Negative.   Eyes: Negative.   Respiratory: Negative.   Cardiovascular: Negative.   Gastrointestinal: Negative.   Genitourinary: Negative.   Musculoskeletal: Negative.   Skin: Negative.   Neurological: Negative.   Endo/Heme/Allergies: Negative.   Psychiatric/Behavioral: Negative.     Blood pressure 121/79, pulse 112, temperature 98.5 F (36.9 C), temperature source Oral, resp. rate 20, height 6' (1.829 m), weight 61.689 kg (136 lb), SpO2 100 %.Body mass index is 18.44 kg/(m^2).  General Appearance: Disheveled  Eye Contact::  Good  Speech:  Garbled  Volume:  Normal  Mood:  Euthymic  Affect:  Congruent  Thought Process:  concrete  Orientation:  Full (Time, Place, and Person)  Thought Content:  Hallucinations: None  Suicidal Thoughts:  No  Homicidal Thoughts:  No  Memory:  Immediate;   Fair Recent;   Fair Remote;   Fair  Judgement:  Poor  Insight:  Shallow  Psychomotor Activity:  Increased  Concentration:  Fair  Recall:  [place]: Fair  Akathisia:  No  Handed:    AIMS (if indicated):     Assets:  Agricultural consultant Housing  ADL's:  Intact  Cognition: WNL  Sleep:  Number of Hours: 7.3   Treatment Plan Summary:  Schizophrenia: care giver reports pt has not been doing well on abilify inj.  He has been more agitated since started on it. Pt says he has been on prolixin and invega in the past.  He is in agreement with a trial of invega. Continue invega but will increase dose to 12 mg qhs. Plan to give  invega sustenna on Friday  EPS the patient will be continued on Inderal 20 mg by mouth twice a day and benztropine 0.5 mg q day  Prevent hyperprolactinemia: will start amantadine 100 mg po bid  Tobacco use disorder the patient will be started on a nicotine patch 21 mg a day  Precautions every 15 minute checks  Diet regular  Hospitalization and status continue IVC  Collateral info from care giver, Vonita Moss- POA: not doing well on abilify.  [phone]  Labs I will order hemoglobin A1c, lipid panel, and a prolactin level  Disposition he'll return back to his home once stable   Follow-up he will continue to follow up with [facility] once a stable  Hildred Priest, MD 02/14/2016, 12:57 PM

## 2016-02-14 NOTE — Progress Notes (Signed)
Awake in room, A&Ox3, denied pain, denied SI/HI, denied AV/H; isolated, slightly irritable on approach, talked about his caregiver and about his Social Security check. Almost rambling and incoherent "when is the snack time?" Will continue to monitor for safety.

## 2016-02-14 NOTE — Progress Notes (Signed)
D: Patient stated slept good last night .Stated appetite is good and energy level  Is normal. Stated concentration is good . Stated on Depression scale 0 , hopeless 0 and anxiety 0 .( low 0-10 high) Denies suicidal  homicidal ideations  .  No auditory hallucinations  No pain concerns . Appropriate ADL'S. Limited  Interacting with peers and staff.  A: Encourage patient participation with unit programming . Instruction  Given on  Medication , verbalize understanding. R: Voice no other concerns. Staff continue to monitor

## 2016-02-14 NOTE — Plan of Care (Signed)
Problem: Consults Goal: [facility] General Treatment Patient Education Outcome: Progressing Patient complient with treatment plan but requires encouragement to go to group daily [place] RN

## 2016-02-14 NOTE — BHH Group Notes (Signed)
[place] Group Notes:  (Nursing/MHT/Case Management/Adjunct)  Date:  02/14/2016  Time:  3:42 PM  Type of Therapy:  Psychoeducational Skills  Participation Level:  None  Participation Quality:  Attentive  Affect:  Flat  Cognitive:  Appropriate  Insight:  Appropriate  Engagement in Group:  None  Modes of Intervention:  Discussion and Education  Summary of Progress/Problems:  Charise Killian 02/14/2016, 3:42 PM

## 2016-02-15 DIAGNOSIS — R000 Tachycardia, unspecified: Secondary | ICD-10-CM | POA: Diagnosis not present

## 2016-02-15 MED ORDER — TRAZODONE HCL 100 MG PO TABS: 100.0000 mg | ORAL_TABLET | Freq: Every day | ORAL | Status: DC

## 2016-02-15 MED ORDER — NICOTINE 14 MG/24HR TD PT24
14.0000 mg | MEDICATED_PATCH | Freq: Every day | TRANSDERMAL | Status: DC
  Administered 2016-02-16: 14 mg via TRANSDERMAL
  Filled 2016-02-15: qty 1

## 2016-02-15 MED ORDER — PROPRANOLOL HCL 20 MG PO TABS
20.0000 mg | ORAL_TABLET | Freq: Two times a day (BID) | ORAL | Status: DC
Start: 2016-02-15 — End: 2017-03-21

## 2016-02-15 MED ORDER — PALIPERIDONE ER 6 MG PO TB24: 12.0000 mg | ORAL_TABLET | Freq: Every day | ORAL | Status: DC

## 2016-02-15 MED ORDER — PALIPERIDONE PALMITATE 156 MG/ML IM SUSP: 156.0000 mg | Freq: Once | INTRAMUSCULAR | Status: DC

## 2016-02-15 MED ORDER — AMANTADINE HCL 100 MG PO CAPS: 100.0000 mg | ORAL_CAPSULE | Freq: Two times a day (BID) | ORAL | Status: DC

## 2016-02-15 MED ORDER — PALIPERIDONE ER 3 MG PO TB24
12.0000 mg | ORAL_TABLET | Freq: Every day | ORAL | Status: DC
  Administered 2016-02-15: 12 mg via ORAL
  Filled 2016-02-15: qty 4

## 2016-02-15 NOTE — BHH Group Notes (Signed)
[place] LCSW Group Therapy  02/15/2016 2:28 PM  Type of Therapy:  Group Therapy  Participation Level:  Did Not Attend  Summary of Progress/Problems: Patient was called to group this afternoon but did not attend.   Keene Breath, MSW, LCSW 02/15/2016, 2:28 PM

## 2016-02-15 NOTE — Progress Notes (Signed)
D: Patient stated slept good last night .Stated appetite is good and energy level  Is normal. Stated concentration is good . Stated on Depression scale 0, hopeless  0 and anxiety 0 .( low 0-10 high) Denies suicidal  homicidal ideations  .  No auditory hallucinations  No pain concerns . Appropriate ADL'S. Interacting with peers and staff. Attended unit programing . Aware of discharge tomorrow. A: Encourage patient participation with unit programming . Instruction  Given on  Medication , verbalize understanding. R: Voice no other concerns. Staff continue to monitor

## 2016-02-15 NOTE — Progress Notes (Signed)
[facility] MD Progress Note  02/15/2016 1:12 PM Christian Lamb  MRN:  [id number] Subjective:  Christian Lamb is a 40 y.o. male with diagnosis of schizophrenia. Patient admitted with aggressive behavior. Per report, the patient told his caregiver that he wanted him dead and that the patient is angry because he cannot have his driver's license her car. The patient has a history of schizophrenia for the last 20 years. He has been living with the same caregiver for years. His caregiver is also his payee and health care power of attorney. The patient reports good compliance with medications prescribed by [facility] in [place]. He has been maintained on injections of Abilify Maintena for years. Patient is states that he last received the medication on April 27.  Pt reports doing well. Denies issues with mood, appetite, energy, sleep, or concentration.  Denies SI, HI or hallucinations.  Denies SE from medications. Feels invega is working well. Denies SE from it.  His care giver reported to [place]  that pt was not doing well on abilify long acting inj.  Pt has been irritable and argumentative. Pt also reports he is getting the inj every 3 weeks instead of every month--clearly showing lack of benefit from that medication.  Per nursing: Patient stated slept good last night .Stated appetite is good and energy level Is normal. Stated concentration is good . Stated on Depression scale , hopeless and anxiety .( low 0-10 high) Denies suicidal homicidal ideations . No auditory hallucinations No pain concerns . Appropriate ADL'S. Interacting with peers and staff. Encourage patient participation with unit programming . Instruction Given on Medication , verbalize understanding.  Voice no other concerns. Staff continue to monitor   Principal Problem: Schizoaffective disorder, bipolar type ([place]) Diagnosis:   Patient Active Problem List   Diagnosis Date Noted  . Tobacco use disorder [F17.200] 02/12/2016  .  Schizoaffective disorder, bipolar type ([place]) [F25.0] 02/11/2016   Total Time spent with patient: 30 minutes  Past Medical History: History reviewed. No pertinent past medical history. History reviewed. No pertinent past surgical history.  Family History: History reviewed. No pertinent family history.   Social History:  History  Alcohol Use No     History  Drug Use No    Social History   Social History  . Marital Status: Single    Spouse Name: N/A  . Number of Children: N/A  . Years of Education: N/A   Social History Main Topics  . Smoking status: Current Every Day Smoker  . Smokeless tobacco: Current User     Comment: Pt refused  . Alcohol Use: No  . Drug Use: No  . Sexual Activity: Not Currently   Other Topics Concern  . None   Social History Narrative     Current Medications: Current Facility-Administered Medications  Medication Dose Route Frequency Provider Last Rate Last Dose  . acetaminophen (TYLENOL) tablet 650 mg  650 mg Oral Q6H PRN Jolanta B Pucilowska, MD      . alum & mag hydroxide-simeth (MAALOX/MYLANTA) 200-200-20 MG/5ML suspension 30 mL  30 mL Oral Q4H PRN Jolanta B Pucilowska, MD      . amantadine (SYMMETREL) capsule 100 mg  100 mg Oral BID Hildred Priest, MD   100 mg at 02/15/16 0907  . benztropine (COGENTIN) tablet 0.5 mg  0.5 mg Oral Daily Hildred Priest, MD   0.5 mg at 02/15/16 0907  . LORazepam (ATIVAN) tablet 0.5 mg  0.5 mg Oral QHS Hildred Priest, MD   0.5 mg at 02/14/16  2140  . magnesium hydroxide (MILK OF MAGNESIA) suspension 30 mL  30 mL Oral Daily PRN Jolanta B Pucilowska, MD      . nicotine (NICODERM CQ - dosed in mg/24 hours) patch 21 mg  21 mg Transdermal Daily Hildred Priest, MD   21 mg at 02/15/16 0908  . [START ON 02/20/2016] paliperidone (INVEGA SUSTENNA) injection 156 mg  156 mg Intramuscular Once Hildred Priest, MD      . paliperidone (INVEGA) 24 hr tablet 12 mg  12 mg Oral QHS  Hildred Priest, MD      . propranolol (INDERAL) tablet 20 mg  20 mg Oral BID Clovis Fredrickson, MD   20 mg at 02/15/16 0907  . traZODone (DESYREL) tablet 100 mg  100 mg Oral QHS Jolanta B Pucilowska, MD   100 mg at 02/14/16 2140    Lab Results:  No results found for this or any previous visit (from the past 48 hour(s)).  Blood Alcohol level:  Lab Results  Component Value Date   ETH <5 02/11/2016     Musculoskeletal: Strength & Muscle Tone: within normal limits Gait & Station: normal Patient leans: N/A  Psychiatric Specialty Exam: Review of Systems  Constitutional: Negative.   HENT: Negative.   Eyes: Negative.   Respiratory: Negative.   Cardiovascular: Negative.   Gastrointestinal: Negative.   Genitourinary: Negative.   Musculoskeletal: Negative.   Skin: Negative.   Neurological: Negative.   Endo/Heme/Allergies: Negative.   Psychiatric/Behavioral: Negative.     Blood pressure 101/71, pulse 129, temperature 98.5 F (36.9 C), temperature source Oral, resp. rate 20, height 6' (1.829 m), weight 61.689 kg (136 lb), SpO2 100 %.Body mass index is 18.44 kg/(m^2).  General Appearance: Disheveled  Eye Contact::  Good  Speech:  Garbled  Volume:  Normal  Mood:  Euthymic  Affect:  Congruent  Thought Process:  concrete  Orientation:  Full (Time, Place, and Person)  Thought Content:  Hallucinations: None  Suicidal Thoughts:  No  Homicidal Thoughts:  No  Memory:  Immediate;   Fair Recent;   Fair Remote;   Fair  Judgement:  Poor  Insight:  Shallow  Psychomotor Activity:  Increased  Concentration:  Fair  Recall:  [employer] of Knowledge:Fair  Language: Fair  Akathisia:  No  Handed:    AIMS (if indicated):     Assets:  Agricultural consultant Housing  ADL's:  Intact  Cognition: WNL  Sleep:  Number of Hours: 8   Treatment Plan Summary:  Schizophrenia: care giver reports pt has not been doing well on abilify inj.  He has been  more agitated since started on it. Pt says he has been on prolixin and invega in the past.  He is in agreement with a trial of invega. Continue invega 12 mg po qhs.  Patient was given Invega injection 234 mg  (02/14/2016) and reports no SE from the medication. Patient was informed that he would need a follow up injection after discharge and verbally voiced understanding and agreement with this treatment.---Needs next inj for 156 mg on May 16  EPS the patient will be continued on Inderal 20 mg by mouth twice a day and benztropine 0.5 mg q day  Prevent hyperprolactinemia: pt has been started on amantadine 100 mg po bid  Tobacco use disorder the patient is on  nicotine patch 21 mg a day  Precautions every 15 minute checks  Diet regular  Hospitalization and status continue IVC  Collateral info from care giver,  Vonita Moss- POA: not doing well on abilify.  [phone]  Labs I will order hemoglobin A1c, lipid panel, and a prolactin level -A1C wnl: 4.9 -lipid panel: HDL low (36) -Prolactin: 1.5   Disposition he'll return back to his home once stable. Plan for discharge in the next 24 hours   Follow-up he will continue to follow up with [facility] once a stable  Hildred Priest, MD 02/15/2016, 1:12 PM

## 2016-02-15 NOTE — Plan of Care (Signed)
Problem: Ineffective individual coping Goal: STG: Patient will remain free from self harm Outcome: Progressing Appropriate behavior , attending unit programing

## 2016-02-15 NOTE — BHH Group Notes (Signed)
[facility] LCSW Group Therapy  02/15/2016 11:04 AM  Type of Therapy:  Group Therapy  Participation Level:  Minimal  Participation Quality:  Appropriate and Attentive  Affect:  Appropriate  Cognitive:  Alert, Appropriate and Oriented  Insight:  Improving  Engagement in Therapy:  Improving  Modes of Intervention:  Socialization and Support  Summary of Progress/Problems: Patient attended and participated in group discussion minimally but introduced himself and participated in an introductory exercise sharing that if he had $1,000,000 he would "build homes for the homeless and charge low rent". Patient shared that he goes for walks when he gets angry but not much more however was attentive throughout group discussion though he was reserved.  Christian Lamb, MSW, LCSW 02/15/2016, 11:04 AM

## 2016-02-15 NOTE — BHH Group Notes (Signed)
[facility] Group Notes:  (Nursing/MHT/Case Management/Adjunct)  Date:  02/15/2016  Time:  3:56 PM  Type of Therapy:  Music Therapy  Participation Level:  None  Participation Quality:  Inattentive  Affect:  Appropriate  Cognitive:  Alert  Insight:  Limited  Engagement in Group:  None  Modes of Intervention:  Activity  Summary of Progress/Problems:  Christian Lamb 02/15/2016, 3:56 PM

## 2016-02-15 NOTE — BHH Group Notes (Signed)
[facility] Group Notes:  (Nursing/MHT/Case Management/Adjunct)  Date:  02/15/2016  Time:  1:11 AM  Type of Therapy:  Group Therapy  Participation Level:  Active  Participation Quality:  Appropriate  Affect:  Appropriate  Cognitive:  Appropriate  Insight:  Appropriate  Engagement in Group:  Engaged  Modes of Intervention:  n/a  Summary of Progress/Problems:  Christian Lamb 02/15/2016, 1:11 AM

## 2016-02-15 NOTE — BHH Counselor (Signed)
Adult Comprehensive Assessment  Patient ID: Cailan General, male   DOB: 1976/07/22, 40 y.o.   MRN: [id number]  Information Source: Information source: Patient  Current Stressors:  Family Relationships: limited support, conflict with HPOA/Payee Financial / Lack of resources (include bankruptcy): limited income Social relationships: limited support  Living/Environment/Situation:  Living Arrangements: Other (Comment) (with friend, payee HPOA) Living conditions (as described by patient or guardian): mostly good, difficult due to conflict on differences of opinion about his level of ability to be independent. How long has patient lived in current situation?: 20 yrs What is atmosphere in current home: Comfortable, Chaotic  Family History:  Marital status: Single Are you sexually active?: No What is your sexual orientation?: Heterosexual  Has your sexual activity been affected by drugs, alcohol, medication, or emotional stress?: None reported  Does patient have children?: No  Childhood History:  By whom was/is the patient raised?: Mother, Other (Comment) (stepfather) Description of patient's relationship with caregiver when they were a child: good Patient's description of current relationship with people who raised him/her: long distance-tries not to "bother" them Does patient have siblings?: Yes Number of Siblings: 4 Description of patient's current relationship with siblings: longdistance, says he tries not to "bother them" Did patient suffer any verbal/emotional/physical/sexual abuse as a child?: No Did patient suffer from severe childhood neglect?: No Has patient ever been sexually abused/assaulted/raped as an adolescent or adult?: No Was the patient ever a victim of a crime or a disaster?: No Witnessed domestic violence?: No Has patient been effected by domestic violence as an adult?: No  Education:  Highest grade of school patient has completed: NA Currently a student?:  No Learning disability?: No  Employment/Work Situation:   Employment situation: On disability Why is patient on disability: schizoaffective disorder Where was the 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  Alcohol/Substance Abuse:   If attempted suicide, did drugs/alcohol play a role in this?: No Alcohol/Substance Abuse Treatment Hx: Denies past history  Social Support System:   Heritage manager System: Poor Describe Community Support System: Community support team Type of faith/religion: christian How does patient's faith help to cope with current illness?: hope comfort  Leisure/Recreation:   Leisure and Hobbies: music, listening, writing.  walking  Strengths/Needs:   What things does the patient do well?: feels he writes/journals well and makes friends easily In what areas does patient struggle / problems for patient: self confidence and self worth.  Discharge Plan:   Does patient have access to transportation?: Yes Plan for no access to transportation at discharge: with friend and community support team Will patient be returning to same living situation after discharge?: Yes Currently receiving community mental health services: Yes (From Whom) ([facility]) If no, would patient like referral for services when discharged?: No Does patient have financial barriers related to discharge medications?: No  Summary/Recommendations:    Patient is a 40 year old male admitted  with a  diagnosis of Schizoaffective Disorder. Patient presented to the hospital after a conflict with POA. Patient reports primary triggers for  admission were conflict with POA. Patient will benefit from crisis stabilization, medication evaluation, group therapy and psycho education in addition to case management for discharge. At discharge, it is recommended that patient remain compliant with established discharge plan and continued treatment.   [place].MSW, [facility]   02/15/2016

## 2016-02-15 NOTE — Discharge Summary (Addendum)
Physician Discharge Summary Note  Patient:  Christian Lamb is an 40 y.o., male MRN:  [id number] DOB:  March 18, 1976 Patient phone:  [phone] (home)  Patient address:   [address] [place] [address],  Total Time spent with patient: 45 minutes  Date of Admission:  02/11/2016 Date of Discharge: 02/16/16  Reason for Admission:  agitation  Principal Problem: Schizoaffective disorder, bipolar type [facility]) Discharge Diagnoses: Patient Active Problem List   Diagnosis Date Noted  . Tobacco use disorder [F17.200] 02/12/2016  . Schizoaffective disorder, bipolar type ([place]) [F25.0] 02/11/2016   History of Present Illness: Christian Lamb is a 40 y.o. male with diagnosis of schizophrenia. Patient admitted with aggressive behavior.  Per report, the patient told his caregiver that he wanted him dead and that the patient is angry because he cannot have his driver's license her car.  Chief complaint. "I don't know what I'm doing here."  The patient has a history of schizophrenia for the last 20 years. He has been living with the same caregiver for years. His caregiver is also his payee and health care power of attorney. The patient reports good compliance with medications prescribed by [facility] in [place]. He has been maintained on injections of Abilify Maintena for years. Patient is states that he last received the medication on April 27.  During assessment today the patient denies having any problems with mood, appetite, energy is sleep or concentration. He denies suicidality, homicidality or having auditory or visual hallucinations. The patient denies any symptoms consistent with mania or with hypomania.   He reports getting along well with his caregiver. He denies that there were any arguments or fights prior to him coming to the emergency room. He says is unclear to him as to why he was brought in here.  Per the ER report during assessment yesterday in the emergency department  the patient was hypomanic as he had pressured speech, was disorganized and was very restless.  Substance abuse history: denies alcohol or illicit substance use. He was negative for substances on admission. Patient smokes about half a pack of cigarettes per day.  Trauma history: Denies   Associated Signs/Symptoms: Depression Symptoms: denies (Hypo) Manic Symptoms: denies Anxiety Symptoms: denies Psychotic Symptoms: denies PTSD Symptoms: Negative   Past Psychiatric History: He was reportedly hospitalized once and diagnosed with schizophrenia. He denies ever attempting suicide. Patient also denies any history of self injury. 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 Patient reports that last year he had a abdominal obstruction.  Family History: Unknown  Family Psychiatric History: His mother had mental illness. He denies any history of substance abuse or suicide in his family  Social History: He is disabled from mental illness. He lives with his caregiver. Patient reports that his caregiver is like a cousin.They have been living together for many years. He reports that his caregiver also suffers from mental illness and receives disability. History  Alcohol Use No     History  Drug Use No    Social History   Social History  . Marital Status: Single    Spouse Name: N/A  . Number of Children: N/A  . Years of Education: N/A   Social History Main Topics  . Smoking status: Current Every Day Smoker  . Smokeless tobacco: Current User     Comment: Pt refused  . Alcohol Use: No  .  Drug Use: No  . Sexual Activity: Not Currently   Other Topics Concern  . None   Social History Narrative    Hospital Course:   Schizophrenia: care giver reports pt has not been doing well on abilify inj. He has been more agitated since started on it. Pt says  he has been on prolixin and invega in the past. He is in agreement with a trial of invega. Continue invega 12 mg po qhs.  Patient was given Invega injection 234 mg (02/14/2016) and reports no SE from the medication. Patient was informed that he would need a follow up injection after discharge and verbally voiced understanding and agreement with this treatment.---Needs next inj for 156 mg on May 16.  I would recommend to continue with invega sustenna 234 mg q 30 days after that (this inj should be given June 7)  EPS:  the patient will be discharge on amantadine 100 mg po bid (also chosen as it will prevent him developing hyperprolactinemia)   Prevent hyperprolactinemia: pt has been started on amantadine 100 mg po bid  Tachycardia: sinus rhythm per EKG.  HR 92.  Continue propranolol 20 mg po bid.   Tobacco use disorder the patient received nicotine patch 21 mg a day  Precautions every 15 minute checks  Diet regular  Hospitalization and status continue IVC  Collateral info from care giver, Vonita Moss- POA: not doing well on abilify.  [phone]  Labs I will order hemoglobin A1c, lipid panel, and a prolactin level -A1C wnl: 4.9 -lipid panel: HDL low (36) -Prolactin: 1.5  Disposition he'll return back to his home today where he lives with his health care power of attorney .   Follow-up he will continue to follow up with [facility] once a stable  During his stay he was clam and cooperative. He attended groups.  No unsafe or disruptive behaviors reported.  On discharge he denies SI, HI or hallucinations. Denies SE from medications.  [place]: he receives CST from them.  Injectables are given at the health department  [facility]: provides therapy and medication management by a psychiatrist.  On discharge the patient denies suicidality, homicidality or having auditory or visual hallucinations. He was calm pleasant and cooperative.  During his stay there were no episodes of agitation. He did not require seclusion, restraints or forced medications. He participated actively in programming.  Physical Findings: AIMS: Facial and Oral Movements Muscles of Facial Expression: None, normal Lips and Perioral Area: None, normal Jaw: None, normal Tongue: None, normal,Extremity Movements Upper (arms, wrists, hands, fingers): None, normal Lower (legs, knees, ankles, toes): None, normal, Trunk Movements Neck, shoulders, hips: None, normal, Overall Severity Severity of abnormal movements (highest score from questions above): None, normal Incapacitation due to abnormal movements: None, normal Patient's awareness of abnormal movements (rate only patient's report): No Awareness, Dental Status Current problems with teeth and/or dentures?: No Does patient usually wear dentures?: No  CIWA:    COWS:     Musculoskeletal: Strength & Muscle Tone: within normal limits Gait & Station: normal Patient leans: N/A  Psychiatric Specialty Exam: Review of Systems  Constitutional: Negative.   HENT: Negative.   Eyes: Negative.   Respiratory: Negative.   Cardiovascular: Negative.   Gastrointestinal: Negative.   Genitourinary: Negative.   Musculoskeletal: Negative.   Skin: Negative.   Neurological: Negative.   Endo/Heme/Allergies: Negative.   Psychiatric/Behavioral: Negative.     Blood pressure 111/64, pulse 105, temperature 98.2 F (36.8 C), temperature source  Oral, resp. rate 20, height 6' (1.829 m), weight 61.689 kg (136 lb), SpO2 100 %.Body mass index is 18.44 kg/(m^2).  General Appearance: Disheveled  Eye Sport and exercise psychologist::  Fair  Speech:  Clear and Coherent  Volume:  Normal  Mood:  Euthymic  Affect:  Appropriate  Thought Process:  Linear  Orientation:  Full (Time, Place, and Person)  Thought Content:  Hallucinations: None  Suicidal Thoughts:  No  Homicidal Thoughts:  No  Memory:  Immediate;   Fair Recent;   Fair Remote;   Fair   Judgement:  Fair  Insight:  Fair  Psychomotor Activity:  Normal  Concentration:  Fair  Recall:  [place] of Knowledge:Fair  Language: Fair  Akathisia:  No  Handed:    AIMS (if indicated):     Assets:  Communication Skills Physical Health  ADL's:  Intact  Cognition: WNL  Sleep:  Number of Hours: 6.75   Have you used any form of tobacco in the last 30 days? (Cigarettes, Smokeless Tobacco, Cigars, and/or Pipes): Yes  Has this patient used any form of tobacco in the last 30 days? (Cigarettes, Smokeless Tobacco, Cigars, and/or Pipes) Yes, Yes, A prescription for an FDA-approved tobacco cessation medication was offered at discharge and the patient refused  Blood Alcohol level:  Lab Results  Component Value Date   [facility] <5 33/54/5625    Metabolic Disorder Labs:  Lab Results  Component Value Date   HGBA1C 4.9 02/12/2016   Lab Results  Component Value Date   PROLACTIN 1.5* 02/12/2016   Lab Results  Component Value Date   CHOL 136 02/12/2016   TRIG 49 02/12/2016   HDL 36* 02/12/2016   CHOLHDL 3.8 02/12/2016   VLDL 10 02/12/2016   [place] 90 02/12/2016    See Psychiatric Specialty Exam and Suicide Risk Assessment completed by Attending Physician prior to discharge.  Discharge destination:  Home  Is patient on multiple antipsychotic therapies at discharge:  Yes,   Do you recommend tapering to monotherapy for antipsychotics?  Yes   Has Patient had three or more failed trials of antipsychotic monotherapy by history:  No  Recommended Plan for Multiple Antipsychotic Therapies: Taper to monotherapy as described:  taper invega oral over the next 30 days     Medication List    TAKE these medications      Indication   amantadine 100 MG capsule  Commonly known as:  SYMMETREL  Take 1 capsule (100 mg total) by mouth 2 (two) times daily.  Notes to Patient:  Prevent SE from invega      paliperidone 156 MG/ML Susp injection  Commonly known as:  INVEGA SUSTENNA  Inject 1 mL  (156 mg total) into the muscle once.  Start taking on:  02/20/2016  Notes to Patient:  schizophrenia      paliperidone 6 MG 24 hr tablet  Commonly known as:  INVEGA  Take 2 tablets (12 mg total) by mouth at bedtime.  Notes to Patient:  schizophrenia      propranolol 20 MG tablet  Commonly known as:  INDERAL  Take 1 tablet (20 mg total) by mouth 2 (two) times daily.  Notes to Patient:  tachycardia      traZODone 100 MG tablet  Commonly known as:  DESYREL  Take 1 tablet (100 mg total) by mouth at bedtime.  Notes to Patient:  insomnia        Follow-up Information    Follow up with [facility] . Go on 02/20/2016.  Why:  Your hospital follow up appointment is on Tuesday, May 16th at 2:00pm. Prior to your appointment, please pick up your injection from the pharmacy and bring it with you to the appointment. They will give you the injection to you during your appointment.    Contact information:   [address] [place], [place] [address] Phone: [phone] Fax: [phone]       Signed: Hildred Priest, MD 02/16/2016, 10:06 AM

## 2016-02-15 NOTE — BHH Group Notes (Signed)
[facility] LCSW Aftercare Discharge Planning Group Note   02/15/2016 10:30 AM  Participation Quality:  Patient attended and participated in group introducing himself and sharing that his SMART goal is to "get my GED once I am discharged from the hospital". Patient was given a daily workbook for Thursday Leisure Activities and is aware of her Tx Team staff to include Education officer, museum and psychiatrist.   Mood/Affect:  Appropriate  Depression Rating:  0  Anxiety Rating:  0  Thoughts of Suicide:  No Will you contract for safety?   NA  Current AVH:  No  Plan for Discharge/Comments:  Discharge to group home with outpatient follow up.  Transportation Means: group home will pick up at discharge  Supports: family and mental health provider outpatient  Keene Breath, MSW, LCSW

## 2016-02-15 NOTE — BHH Suicide Risk Assessment (Signed)
[facility] Discharge Suicide Risk Assessment   Principal Problem: Schizoaffective disorder, bipolar type [facility]) Discharge Diagnoses:  Patient Active Problem List   Diagnosis Date Noted  . Tobacco use disorder [F17.200] 02/12/2016  . Schizoaffective disorder, bipolar type ([place]) [F25.0] 02/11/2016     Psychiatric Specialty Exam: ROS                                                         Mental Status Per Nursing Assessment::   On Admission:     Demographic Factors:  Male  Loss Factors: NA  Historical Factors: Impulsivity  Risk Reduction Factors:   Living with another person, especially a relative and Positive social support  Continued Clinical Symptoms:  Schizophrenia:   Paranoid or undifferentiated type  Cognitive Features That Contribute To Risk:  None    Suicide Risk:  Minimal: No identifiable suicidal ideation.  Patients presenting with no risk factors but with morbid ruminations; may be classified as minimal risk based on the severity of the depressive symptoms   Hildred Priest, MD 02/16/2016, 7:01 AM

## 2016-02-16 NOTE — Progress Notes (Signed)
D: Pt affect is flat this evening. He rates depression 0/10 and anxiety 1/10. Denies SI/HI/AVH at this time. Denies pain. Pt states that his goal today was "to do what staff tells me." Pt verbalizes excitement about discharge tomorrow. A: Emotional support and encouragement provided. Medications administered with education. q15 minute safety checks maintained. R: Pt remains free from harm. Will continue to monitor.

## 2016-02-16 NOTE — Progress Notes (Signed)
Patient denies SI/HI, denies A/V hallucinations. Patient verbalizes understanding of discharge instructions, follow up care and prescriptions. Patient given all belongings from  locker. Patient escorted out by staff, transported by the care giver.

## 2016-02-16 NOTE — Tx Team (Signed)
Interdisciplinary Treatment Plan Update (Adult)  Date:  02/16/2016 Time Reviewed:  12:30 PM  Progress in Treatment: Attending groups: Yes. Participating in groups:  Yes. Taking medication as prescribed:  Yes. Tolerating medication:  Yes. Family/Significant othe contact made:  Yes, individual(s) contacted:  HPOA Patient understands diagnosis:  Yes. Discussing patient identified problems/goals with staff:  Yes. Medical problems stabilized or resolved:  Yes. Denies suicidal/homicidal ideation: Yes. Issues/concerns per patient self-inventory:  Yes. Other:  New problem(s) identified: No, Describe:  NA  Discharge Plan or Barriers: Pt plans to return home and follow up with outpatient.    Reason for Continuation of Hospitalization: Aggression Homicidal ideation Medication stabilization  Comments:Christian Lamb is a 40 y.o. male with diagnosis of schizophrenia. Patient admitted with aggressive behavior. Per report, the patient told his caregiver that he wanted him dead and that the patient is angry because he cannot have his driver's license her car. The patient has a history of schizophrenia for the last 20 years. He has been living with the same caregiver for years. His caregiver is also his payee and health care power of attorney. The patient reports good compliance with medications prescribed by [facility] in [place]. He has been maintained on injections of Abilify Maintena for years. Patient is states that he last received the medication on April 27. Pt reports doing well. Denies issues with mood, appetite, energy, sleep, or concentration. Denies SI, HI or hallucinations. Denies SE from medications. Feels invega is working well. Denies SE from it. His care giver reported to [place] that pt was not doing well on abilify long acting inj. Pt has been irritable and argumentative. Pt also reports he is getting the inj every 3 weeks instead of every month--clearly showing lack of benefit  from that medication.   Estimated length of stay: Pt will likely d/c today.   New goal(s): NA  Review of initial/current patient goals per problem list:   1.  Goal(s): Patient will participate in aftercare plan * Met:  * Target date: at discharge * As evidenced by: Patient will participate within aftercare plan AEB aftercare provider and housing plan at discharge being identified.   2.  Goal (s): Patient will exhibit decreased depressive symptoms and homicidal ideations. * Met:  *  Target date: at discharge * As evidenced by: Patient will utilize self rating of depression at 3 or below and demonstrate decreased signs of depression or be deemed stable for discharge by MD. *   Attendees: Patient:  Christian Lamb 5/12/201712:30 PM  Family:   5/12/201712:30 PM  Physician:  Dr. Jerilee Hoh   5/12/201712:30 PM  Nursing:   Junita Push, RN  5/12/201712:30 PM  Case Manager:   5/12/201712:30 PM  Counselor:   5/12/201712:30 PM  [place]  5/12/201712:30 PM  Other:   5/12/201712:30 PM  Other:   5/12/201712:30 PM  Other:  5/12/201712:30 PM  Other:  5/12/201712:30 PM  Other:  5/12/201712:30 PM  Other:  5/12/201712:30 PM  Other:  5/12/201712:30 PM  Other:  5/12/201712:30 PM  Other:   5/12/201712:30 PM   Scribe for Treatment Team:   Wray Kearns, MSW, LCSWA  02/16/2016, 12:30 PM

## 2016-02-16 NOTE — BHH Group Notes (Signed)
[facility] LCSW Group Therapy  02/16/2016 10:43 AM  Type of Therapy:  Group Therapy  Participation Level:  None  Summary of Progress/Problems: Patient came to group but left after 10 minutes and did not return and did not have the opportunity to introduce himself to the group or participate in introductory exercise. Patient will discharge today.  Keene Breath, MSW, LCSW 02/16/2016, 10:43 AM

## 2016-02-16 NOTE — Progress Notes (Signed)
  [facility] Adult Case Management Discharge Plan :  Will you be returning to the same living situation after discharge:  Yes,  home  At discharge, do you have transportation home?: Yes,  HPOA Do you have the ability to pay for your medications: Yes,  insurance   Release of information consent forms completed and in the chart;  Patient's signature needed at discharge.  Patient to Follow up at: Follow-up Information    Follow up with [facility] . Go on 02/20/2016.   Why:  Your hospital follow up appointment is on Tuesday, May 16th at 2:00pm. Prior to your appointment, please pick up your injection from the pharmacy and bring it with you to the appointment. They will give you the injection to you during your appointment.    Contact information:   [address] [place], [place] [address] Phone: [phone] Fax: [phone]      Next level of care provider has access to [place] and Suicide Prevention discussed: Yes,  With HPOA  Have you used any form of tobacco in the last 30 days? (Cigarettes, Smokeless Tobacco, Cigars, and/or Pipes): Yes  Has patient been referred to the Quitline?: Patient refused referral  Patient has been referred for addiction treatment: [place] MSW, [place]  02/16/2016, 12:34 PM

## 2016-02-16 NOTE — Plan of Care (Signed)
Problem: Ineffective individual coping Goal: STG: Patient will remain free from self harm Outcome: Progressing Pt remains free from harm.  Problem: Aggression Towards others,Towards Self, and or Destruction Goal: LTG - No aggression,physical/verbal/destruction prior to D/C (Patient will have no episodes of physical or verbal aggression or property destruction towards self or others for _____ day (s) prior to discharge.)  Outcome: Progressing Pt does not display any episodes of aggression this evening.

## 2016-12-01 ENCOUNTER — Encounter: Payer: Self-pay | Admitting: Emergency Medicine

## 2016-12-01 ENCOUNTER — Emergency Department
Admission: EM | Admit: 2016-12-01 | Discharge: 2016-12-04 | Disposition: A | Discharge status: Home / Self Care | Payer: Medicaid Other | Attending: Emergency Medicine | Admitting: Emergency Medicine

## 2016-12-01 DIAGNOSIS — Z046 Encounter for general psychiatric examination, requested by authority: Secondary | ICD-10-CM | POA: Diagnosis present

## 2016-12-01 DIAGNOSIS — F250 Schizoaffective disorder, bipolar type: Secondary | ICD-10-CM | POA: Insufficient documentation

## 2016-12-01 DIAGNOSIS — F17200 Nicotine dependence, unspecified, uncomplicated: Secondary | ICD-10-CM | POA: Insufficient documentation

## 2016-12-01 DIAGNOSIS — Z79899 Other long term (current) drug therapy: Secondary | ICD-10-CM | POA: Diagnosis not present

## 2016-12-01 HISTORY — DX: Schizophrenia, unspecified: F20.9

## 2016-12-01 LAB — SALICYLATE LEVEL: Salicylate Lvl: <7 mg/dL (ref 2.8–30.0)

## 2016-12-01 LAB — URINE DRUG SCREEN, QUALITATIVE (ARMC ONLY)
Amphetamines, Ur Screen: NOT DETECTED
BARBITURATES, UR SCREEN: NOT DETECTED
BENZODIAZEPINE, UR SCRN: NOT DETECTED
CANNABINOID 50 NG, UR ~~LOC~~: NOT DETECTED
Cocaine Metabolite,Ur ~~LOC~~: NOT DETECTED
MDMA (Ecstasy)Ur Screen: NOT DETECTED
Methadone Scn, Ur: NOT DETECTED
OPIATE, UR SCREEN: NOT DETECTED
PHENCYCLIDINE (PCP) UR S: NOT DETECTED
Tricyclic, Ur Screen: NOT DETECTED

## 2016-12-01 LAB — CBC
HEMATOCRIT: 44.6 % (ref 40.0–52.0)
Hemoglobin: 15.3 g/dL (ref 13.0–18.0)
MCH: 30 pg (ref 26.0–34.0)
MCHC: 34.2 g/dL (ref 32.0–36.0)
MCV: 87.7 fL (ref 80.0–100.0)
PLATELETS: 249 10*3/uL (ref 150–440)
RBC: 5.09 MIL/uL (ref 4.40–5.90)
RDW: 13.3 % (ref 11.5–14.5)
WBC: 13.3 10*3/uL — ABNORMAL HIGH (ref 3.8–10.6)

## 2016-12-01 LAB — ACETAMINOPHEN LEVEL: Acetaminophen (Tylenol), Serum: <10 ug/mL — ABNORMAL LOW (ref 10–30)

## 2016-12-01 LAB — COMPREHENSIVE METABOLIC PANEL
ALK PHOS: 79 U/L (ref 38–126)
ALT: 17 U/L (ref 17–63)
AST: 23 U/L (ref 15–41)
Albumin: 4 g/dL (ref 3.5–5.0)
Anion gap: 8 (ref 5–15)
BILIRUBIN TOTAL: 1.2 mg/dL (ref 0.3–1.2)
BUN: 14 mg/dL (ref 6–20)
CALCIUM: 9.2 mg/dL (ref 8.9–10.3)
CO2: 25 mmol/L (ref 22–32)
CREATININE: 0.98 mg/dL (ref 0.61–1.24)
Chloride: 105 mmol/L (ref 101–111)
GFR calc Af Amer: >60 mL/min (ref >60)
Glucose, Bld: 91 mg/dL (ref 65–99)
POTASSIUM: 3.5 mmol/L (ref 3.5–5.1)
Sodium: 138 mmol/L (ref 135–145)
TOTAL PROTEIN: 9 g/dL — AB (ref 6.5–8.1)

## 2016-12-01 LAB — ETHANOL
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
## 2016-12-01 NOTE — ED Notes (Signed)
Pt. Changed into burgundy scrubs.  Pt. Cooperative.   Pt. Denies having anything of value to store in safe.  Pt. Has one bag with shoes, pair of jeans, short sleeved shirt, pair of socks and belt.

## 2016-12-02 DIAGNOSIS — F250 Schizoaffective disorder, bipolar type: Secondary | ICD-10-CM

## 2016-12-02 MED ORDER — BISMUTH SUBSALICYLATE 262 MG/15ML PO SUSP
30.0000 mL | Freq: Once | ORAL | Status: AC
  Administered 2016-12-03: 30 mL via ORAL
  Filled 2016-12-02: qty 118

## 2016-12-02 MED ORDER — PALIPERIDONE PALMITATE 156 MG/ML IM SUSP
156.0000 mg | Freq: Once | INTRAMUSCULAR | Status: AC
  Administered 2016-12-02: 156 mg via INTRAMUSCULAR
  Filled 2016-12-02: qty 1

## 2016-12-02 NOTE — ED Notes (Signed)
Pts. Caregiver called to say he wouldn't be picking up pt. Till he has someone to ride with him. TTS notified.
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
## 2016-12-02 NOTE — ED Notes (Signed)
Moved to ED BHU.

## 2016-12-02 NOTE — ED Notes (Addendum)
Pt will be discharged today. Pt accepting.Lunch tray given.  Pt calm and cooperative with staff. Maintained on 15 minute checks and observation by security camera for safety.
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
## 2016-12-02 NOTE — ED Notes (Signed)
Christian Lamb called and made aware of pt's discharge. [name] will be here to pick up pt after 4pm.  Maintained on 15 minute checks and observation by security camera for safety.
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
## 2016-12-02 NOTE — ED Notes (Signed)
Hourly rounding reveals patient in room. Pt. C/o diarrhea stool on last trip to restroom, stable, in no acute distress. Q15 minute rounds and monitoring via [employer] to continue.

## 2016-12-02 NOTE — ED Notes (Signed)
Hourly rounding reveals patient sleeping in room. No complaints, stable, in no acute distress. Q15 minute rounds and monitoring via Security Cameras to continue. 
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
## 2016-12-02 NOTE — ED Notes (Signed)
Report to include Situation, Background, Assessment, and Recommendations received from Amy B. RN. Patient alert and oriented, warm and dry, in no acute distress. Patient denies SI, HI, AVH and pain. Patient made aware of Q15 minute rounds and security cameras for their safety. Patient instructed to come to me with needs or concerns.

## 2016-12-02 NOTE — ED Notes (Signed)
[name] called to inform RN he is running late to pick up patient due to childcare issues. [name] stated he will call [facility] when he is on his way.   Pt is calm and cooperative with staff. No needs or concerns voiced.Maintained on 15 minute checks and observation by security camera for safety.

## 2016-12-02 NOTE — ED Notes (Signed)
IVC  RESCINDED  PER DR  CLAPACS  INFORMED  RN  AMY IN  [facility]  UNIT

## 2016-12-02 NOTE — ED Notes (Signed)
Hourly rounding reveals patient in room. No complaints, stable, in no acute distress. Q15 minute rounds and monitoring via Security Cameras to continue. 

## 2016-12-03 MED ORDER — BISMUTH SUBSALICYLATE 262 MG/15ML PO SUSP
30.0000 mL | Freq: Once | ORAL | Status: AC
  Administered 2016-12-03: 30 mL via ORAL
  Filled 2016-12-03: qty 118

## 2016-12-03 NOTE — ED Notes (Signed)
Hourly rounding reveals patient sleeping in room. No complaints, stable, in no acute distress. Q15 minute rounds and monitoring via Security Cameras to continue. 

## 2016-12-03 NOTE — ED Notes (Signed)
Patient taking shower- Towels, toiletries, and new scrubs given to patient.

## 2016-12-03 NOTE — ED Notes (Signed)
Breakfast tray brought to patient. Pt reports having slept well

## 2016-12-03 NOTE — ED Notes (Signed)
Patient calm and cooperative this am, requesting something for diarrhea (x1 this am). Pt denies SI/HI and A/V hallucinations at this time. Pt inquiring about his ride- as to when he will arrive. Pt remains safe with 15 minute checks

## 2016-12-03 NOTE — Discharge Instructions (Signed)
You have been seen in the Emergency Department (ED) today for a psychiatric complaint.  You have been evaluated by psychiatry and we believe you are safe to be discharged from the hospital.    Please return to the ED immediately if you have ANY thoughts of hurting yourself or anyone else, so that we may help you.  Please avoid alcohol and drug use.  Follow up with your doctor and/or therapist as soon as possible regarding today's ED visit.   Please follow up any other recommendations and clinic appointments provided by the psychiatry team that saw you in the Emergency Department.

## 2016-12-03 NOTE — ED Notes (Signed)
Pt is alert and oriented this evening. Pt mood is appropriate and his affect is sad. Pt wants to know when he can return home. Writer discussed plan and awaiting pick up by caregiver. Pt denies SI/HI and AVH although he is somewhat paranoid/guarded. Food and drink provided and 15 minute checks are ongoing for safety. Pt returned to sleep following snack.

## 2016-12-03 NOTE — ED Notes (Signed)
Patient given snack- pb and crackers

## 2016-12-03 NOTE — ED Notes (Signed)
Report to oncoming staff.

## 2016-12-03 NOTE — ED Provider Notes (Addendum)
-----------------------------------------   9:04 AM on 12/03/2016 -----------------------------------------  Patient still having some mild diarrhea which was helped yesterday by Pepto-Bismol.  Ordered another dose.   ----------------------------------------- 7:24 AM on 12/03/2016 -----------------------------------------   Blood pressure (!) 103/59, pulse 86, temperature 97.6 F (36.4 C), temperature source Oral, resp. rate 18, height 6' (1.829 m), weight 63.5 kg, SpO2 100 %.  The patient had no acute events since last update.  Calm and cooperative at this time.  Dr. Weber Cooks wrote a note yesterday and revoked the IVC paperwork.  His caretaker was not willing to pick him up yesterday due to the caretaker's own safety concerns.  The current plan is that the caretaker and a companion will come pick up this patient today.  He is currently voluntary.     Hinda Kehr, MD 12/03/16 [place], MD 12/03/16 [phone]

## 2016-12-03 NOTE — ED Notes (Signed)
Dinner brought to patient

## 2016-12-03 NOTE — ED Notes (Signed)
Lunch tray brought to patient

## 2016-12-04 NOTE — ED Notes (Signed)
Social worker talking to Patient.
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
## 2016-12-04 NOTE — ED Notes (Signed)
Patient talked with His mom on the phone, Patient without any behavior issues, He is calm, q 15 minute checks and camera surveillance in progress.

## 2016-12-04 NOTE — ED Notes (Signed)
Patient is alert and oriented, no signs of distress, will be discharged back to His moms house, all belongings given to patient. Patient voices understanding of all discharge instructions.

## 2016-12-04 NOTE — ED Notes (Signed)
Patient dressed and walked out to [employer] with nurse and was transported via Cab.
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
## 2016-12-04 NOTE — ED Notes (Signed)
Patient's cousin brought him some of His clothing, nurse labeled and is in storage area in black garbage bag.

## 2016-12-04 NOTE — ED Provider Notes (Signed)
Vitals:   12/03/16 1919 12/04/16 0542  BP: 107/77 102/67  Pulse: (!) 104 85  Resp: 18 20  Temp: 98.2 F (36.8 C) 98 F (36.7 C)   Labs Reviewed  COMPREHENSIVE METABOLIC PANEL - Abnormal; Notable for the following:       Result Value   Total Protein 9.0 (*)    All other components within normal limits  ACETAMINOPHEN LEVEL - Abnormal; Notable for the following:    Acetaminophen (Tylenol), Serum <10 (*)    All other components within normal limits  CBC - Abnormal; Notable for the following:    WBC 13.3 (*)    All other components within normal limits  ETHANOL  SALICYLATE LEVEL  URINE DRUG SCREEN, QUALITATIVE ([facility] ONLY)   Patient remains medically cleared for psychiatric evaluation and disposition   Earleen Newport, MD 12/04/16 [phone]

## 2016-12-04 NOTE — ED Notes (Signed)
Patient ate 100% of His lunch.

## 2016-12-04 NOTE — ED Notes (Signed)
Patient is sleeping now back in room, q 15 minute checks and camera monitoring in progress.

## 2016-12-04 NOTE — ED Notes (Signed)
Patient served Cytogeneticist order per [facility] department. Patient remained calm, and ask to use the phone, no signs of distress. Nurse will continue to monitor.
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
## 2016-12-04 NOTE — ED Notes (Signed)
Patient with breakfast tray and beverage. Patient is alert and oriented, no complaints, denies Si/hi or avh at this time. q 15 minute checks and camera surveillance in progress.

## 2016-12-04 NOTE — ED Notes (Signed)
Patient's cousin came to ED lobby to pick up keys that belong to his car and house, sargent Beryl Meager and Education officer, museum went with nurse to ED, explained to caretaker that He should bring some of His belongings here and that he could arrange someone else in family or Sheriff to escort Patient when He is released to come get the remainder of His belongings. Patient's cousin states that He is not the guardian but that he has made decisions for patient and receives His check and will stop payments from coming to His home, states ' I will send check back if it comes to my house, and I don't want him at my house, I have small children there with me" Social worker. Let him know that she would be attempting to find placement once guardianship was established.

## 2016-12-04 NOTE — ED Notes (Signed)
Patient with taxi voucher to be transported to [place] with his mom, patient talked to mom and so did nurse on the phone to verify again prior to leaving,patient is calm, no misconduct, or behavior issues,denies Si/hi or avh. Camera surveillance in progress.

## 2017-03-14 ENCOUNTER — Encounter: Payer: Self-pay | Admitting: Gastroenterology

## 2017-03-21 ENCOUNTER — Encounter: Payer: Self-pay | Admitting: Gastroenterology

## 2017-03-21 ENCOUNTER — Encounter (INDEPENDENT_AMBULATORY_CARE_PROVIDER_SITE_OTHER): Payer: Self-pay

## 2017-03-21 ENCOUNTER — Ambulatory Visit (INDEPENDENT_AMBULATORY_CARE_PROVIDER_SITE_OTHER): Payer: Medicaid Other | Admitting: Gastroenterology

## 2017-03-21 ENCOUNTER — Other Ambulatory Visit (INDEPENDENT_AMBULATORY_CARE_PROVIDER_SITE_OTHER): Payer: Medicaid Other

## 2017-03-21 VITALS — BP 88/58 | HR 99 | Ht 71.26 in | Wt 131.0 lb

## 2017-03-21 DIAGNOSIS — R634 Abnormal weight loss: Secondary | ICD-10-CM

## 2017-03-21 DIAGNOSIS — K529 Noninfective gastroenteritis and colitis, unspecified: Secondary | ICD-10-CM | POA: Diagnosis not present

## 2017-03-21 DIAGNOSIS — K51919 Ulcerative colitis, unspecified with unspecified complications: Secondary | ICD-10-CM

## 2017-03-21 HISTORY — DX: Abnormal weight loss: R63.4

## 2017-03-21 LAB — COMPREHENSIVE METABOLIC PANEL
ALBUMIN: 3.5 g/dL (ref 3.5–5.2)
ALT: 8 U/L (ref 0–53)
AST: 10 U/L (ref 0–37)
Alkaline Phosphatase: 62 U/L (ref 39–117)
BILIRUBIN TOTAL: 0.6 mg/dL (ref 0.2–1.2)
BUN: 12 mg/dL (ref 6–23)
CALCIUM: 9.2 mg/dL (ref 8.4–10.5)
CO2: 26 meq/L (ref 19–32)
CREATININE: 0.95 mg/dL (ref 0.40–1.50)
Chloride: 108 mEq/L (ref 96–112)
GFR: 112.21 mL/min (ref >60.00)
Glucose, Bld: 77 mg/dL (ref 70–99)
Potassium: 3.7 mEq/L (ref 3.5–5.1)
SODIUM: 139 meq/L (ref 135–145)
Total Protein: 7.8 g/dL (ref 6.0–8.3)

## 2017-03-21 LAB — CBC WITH DIFFERENTIAL/PLATELET
BASOS ABS: 0.1 10*3/uL (ref 0.0–0.1)
Basophils Relative: 0.6 % (ref 0.0–3.0)
EOS ABS: 0.1 10*3/uL (ref 0.0–0.7)
Eosinophils Relative: 0.6 % (ref 0.0–5.0)
HEMATOCRIT: 38 % — AB (ref 39.0–52.0)
HEMOGLOBIN: 12.1 g/dL — AB (ref 13.0–17.0)
LYMPHS PCT: 15.7 % (ref 12.0–46.0)
Lymphs Abs: 1.5 10*3/uL (ref 0.7–4.0)
MCHC: 31.9 g/dL (ref 30.0–36.0)
MCV: 88.6 fl (ref 78.0–100.0)
Monocytes Absolute: 0.8 10*3/uL (ref 0.1–1.0)
Monocytes Relative: 7.8 % (ref 3.0–12.0)
Neutro Abs: 7.3 10*3/uL (ref 1.4–7.7)
Neutrophils Relative %: 75.3 % (ref 43.0–77.0)
PLATELETS: 275 10*3/uL (ref 150.0–400.0)
RBC: 4.28 Mil/uL (ref 4.22–5.81)
RDW: 13.2 % (ref 11.5–15.5)
WBC: 9.7 10*3/uL (ref 4.0–10.5)

## 2017-03-21 LAB — C-REACTIVE PROTEIN: CRP: 2.1 mg/dL (ref 0.5–20.0)

## 2017-03-21 LAB — IGA: IGA: 215 mg/dL (ref 68–378)

## 2017-03-21 NOTE — Progress Notes (Signed)
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 These surgeries were performed at [facility].  Apparently he also had some type of small bowel surgery for bowel obstruction in 2016 in [place] as well.  He presents to our office today at the request of his PCP, Dr. Alphonzo Grieve, for evaluation regarding severe chronic diarrhea. The patient tells me that since last August he has been having diarrhea several times per day, over 10 times a day and having nocturnal bowel movements as well. Denies seeing blood in his stool. Denies abdominal pain. Reports that a lot of his bowel movements seem to be postprandial. He reports that prior to last year he had not been experiencing any significant issues with diarrhea. Has not been following with a gastroenterologist. It is very difficult to obtain information from him due to his psychiatric illness and being very and educated about his condition. We do not have any records. Recent TSH and celiac labs were normal. HIV negative.  Pathology report from 12/2001:  Colon, total colectomy.  -Moderate to severe chronicactive colitis consistentwith ulcerative colitisinvolving essentially the entire colon (see comment).  - Proximal (ileal) margin with mild acute serositis.  - Distal (anal) margin with mild chronic active colitis (lymphocytic proctitis).  -Appendix withacute appendicitisconsistent withinvolvementby ulcerativecolitis.  - Four lymph nodes with lymphoid hyperplasia, no evidence of malignancy (0/4).  - Acute serositis, mild.  - No evidence of dysplasia or malignancy.   Pathology report from 02/2002:  A: Ileostomy site, removal  - Enterocutaneous fistula with  ostomy site changes    B: Rectum, partial resection  - Moderate to severechronic active colitis with cryptabscesses, architecturaldistortionwithglanddropout, andlymphoidhyperplasia,consistentwithulcerative colitis, involving both the proximal and distal margins  - No evidence of dysplasia or carcinoma    C: Anastomosis ring, removal  - Moderate to severe chronic active colitis consistent with ulcerative colitis  - No evidence of dysplasia or carcinoma   Past Medical History:  Diagnosis Date  . Schizophrenia ([place])   . Ulcerative colitis [facility])    Past Surgical History:  Procedure Laterality Date  . TOTAL COLECTOMY  2003    reports that he has been smoking Cigarettes.  He has never used smokeless tobacco. He reports that he does not drink alcohol or use drugs. family history includes Colon cancer in his father and mother. No Known Allergies    Outpatient Encounter Prescriptions as of 03/21/2017  Medication Sig  . paliperidone (INVEGA SUSTENNA) 156 MG/ML SUSP injection Inject 1 mL (156 mg total) into the muscle once.  . paliperidone (INVEGA) 6 MG 24 hr tablet Take 2 tablets (12 mg total) by mouth at bedtime.  . [DISCONTINUED] amantadine (SYMMETREL) 100 MG capsule Take 1 capsule (100 mg total) by mouth 2 (two) times daily.  . [DISCONTINUED] propranolol (INDERAL) 20 MG tablet Take 1 tablet (20 mg total) by mouth 2 (two) times daily.  . [DISCONTINUED] traZODone (DESYREL) 100 MG tablet Take 1 tablet (100 mg total) by mouth at bedtime.   No facility-administered encounter medications on file as of 03/21/2017.      REVIEW OF SYSTEMS  : All other systems reviewed and negative except where noted in the History of Present Illness.   PHYSICAL EXAM: BP (!) 88/58   Pulse 99  Ht 5' 11.26" (1.81 m)   Wt 131 lb (59.4 kg)   BMI 18.14 kg/m  General: Thin black male in no acute distress Head: Normocephalic and atraumatic Eyes:  Sclerae anicteric, conjunctiva  pink. Ears: Normal auditory acuity Lungs: Clear throughout to auscultation; no increased WOB. Heart: Regular rate and rhythm Abdomen: Soft, non-distended. Normal bowel sounds.  Non-tender.  Scars from previous surgeries noted. Musculoskeletal: Symmetrical with no gross deformities  Skin: No lesions on visible extremities Extremities: No edema  Neurological: Alert oriented x 4, grossly non-focal Psychological:  Alert and cooperative. 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 Apparently he also had some type of small bowel surgery for bowel obstruction in 2016 in [place] as well. Presents with severe diarrhea and some weight loss. We're going to try to obtain some more records, specifically from a more recent issues that he had in 2016. I'm going to check some labs including a CBC, CMP, CRP. We'll check stool studies including a C. difficile, ova and parasites. I would also like some imaging of his abdomen so I'm going to schedule him for CT enterography. His diarrhea may be from the absence of a colon, but he had gone several years without any major issues. Will evaluate for any small bowel involvement.     CC:  No ref. provider found

## 2017-03-21 NOTE — Progress Notes (Signed)
Agree with assessment and plan as outlined. Will need more records to clarify his history, concerning for possible Crohn's of small bowel now. Will await labs, stool studies, and enterography study first. I think regardless he may need an endoscopic evaluation as well, but will await initial workup.

## 2017-03-21 NOTE — Patient Instructions (Signed)
Your physician has requested that you go to the basement for lab work before leaving today.   You have been scheduled for a CT scan of the abdomen and pelvis at [facility] ([address]---this is in the same building as [facility]).   You are scheduled on 03-26-17 at 3 pm. You should arrive at 1:45 pm for registration and to drink contrast. Please follow the written instructions below on the day of your exam:  WARNING: IF YOU ARE ALLERGIC TO IODINE/X-RAY DYE, PLEASE NOTIFY RADIOLOGY IMMEDIATELY AT [phone]! YOU WILL BE GIVEN A 13 HOUR PREMEDICATION PREP.  Do not eat or drink anything after 11 am (4 hours prior to your test)  The purpose of you drinking the oral contrast is to aid in the visualization of your intestinal tract. The contrast solution may cause some diarrhea. Before your exam is started, you will be given a small amount of fluid to drink. Depending on your individual set of symptoms, you may also receive an intravenous injection of x-ray contrast/dye. Plan on being at [facility] for 30 minutes or longer, depending on the type of exam you are having performed.  This test typically takes 30-45 minutes to complete.  If you have any questions regarding your exam or if you need to reschedule, you may call the CT department at [phone] between the hours of 8:00 am and 5:00 pm, Monday-Friday.  ________________________________________________________________________

## 2017-03-22 LAB — CLOSTRIDIUM DIFFICILE BY PCR: Toxigenic C. Difficile by PCR: NOT DETECTED

## 2017-03-24 LAB — OVA AND PARASITE EXAMINATION: OP: NONE SEEN

## 2017-03-26 ENCOUNTER — Inpatient Hospital Stay: Admission: RE | Admit: 2017-03-26 | Payer: Self-pay | Source: Ambulatory Visit

## 2017-03-28 ENCOUNTER — Ambulatory Visit (INDEPENDENT_AMBULATORY_CARE_PROVIDER_SITE_OTHER)
Admission: RE | Admit: 2017-03-28 | Discharge: 2017-03-28 | Disposition: A | Discharge status: Home / Self Care | Payer: Medicaid Other | Source: Ambulatory Visit | Attending: Gastroenterology | Admitting: Gastroenterology

## 2017-03-28 DIAGNOSIS — K51919 Ulcerative colitis, unspecified with unspecified complications: Secondary | ICD-10-CM | POA: Diagnosis not present

## 2017-03-28 DIAGNOSIS — R634 Abnormal weight loss: Secondary | ICD-10-CM

## 2017-03-28 DIAGNOSIS — K529 Noninfective gastroenteritis and colitis, unspecified: Secondary | ICD-10-CM | POA: Diagnosis not present

## 2017-03-28 MED ORDER — BARIUM SULFATE 0.1 % PO SUSP: 450.0000 mL | Freq: Once | ORAL | Status: DC

## 2017-03-28 MED ORDER — IOPAMIDOL (ISOVUE-300) INJECTION 61%
100.0000 mL | Freq: Once | INTRAVENOUS | Status: AC | PRN
  Administered 2017-03-28: 100 mL via INTRAVENOUS

## 2017-04-11 ENCOUNTER — Ambulatory Visit (AMBULATORY_SURGERY_CENTER): Payer: Self-pay | Admitting: *Deleted

## 2017-04-11 VITALS — Ht 72.0 in | Wt 125.0 lb

## 2017-04-11 DIAGNOSIS — R634 Abnormal weight loss: Secondary | ICD-10-CM

## 2017-04-11 DIAGNOSIS — K529 Noninfective gastroenteritis and colitis, unspecified: Secondary | ICD-10-CM

## 2017-04-11 MED ORDER — NA SULFATE-K SULFATE-MG SULF 17.5-3.13-1.6 GM/177ML PO SOLN: ORAL | 0 refills | Status: DC

## 2017-04-11 NOTE — Progress Notes (Signed)
Patient denies any allergies to eggs or soy. Patient denies any problems with anesthesia/sedation. Patient denies any oxygen use at home and does not take any diet/weight loss medications. EMMI education assisgned to patient on colonoscopy, this was explained and instructions given to patient. Explained the need to have care-partner available, instructions went over slowly, patient verbalizes understanding.

## 2017-04-17 ENCOUNTER — Telehealth: Payer: Self-pay | Admitting: Gastroenterology

## 2017-04-17 NOTE — Telephone Encounter (Signed)
Left message on machine to call back  

## 2017-04-17 NOTE — Telephone Encounter (Signed)
Pt confirmed appt for previsit and colon

## 2017-04-18 ENCOUNTER — Encounter: Payer: Medicaid Other | Admitting: Gastroenterology

## 2017-06-04 ENCOUNTER — Ambulatory Visit (AMBULATORY_SURGERY_CENTER): Payer: Self-pay

## 2017-06-04 VITALS — Ht 72.0 in | Wt 125.4 lb

## 2017-06-04 DIAGNOSIS — R634 Abnormal weight loss: Secondary | ICD-10-CM

## 2017-06-04 DIAGNOSIS — K529 Noninfective gastroenteritis and colitis, unspecified: Secondary | ICD-10-CM

## 2017-06-04 MED ORDER — SUPREP BOWEL PREP KIT 17.5-3.13-1.6 GM/177ML PO SOLN: 1.0000 | Freq: Once | ORAL | 0 refills | Status: AC

## 2017-06-11 ENCOUNTER — Telehealth: Payer: Self-pay | Admitting: Gastroenterology

## 2017-06-11 ENCOUNTER — Encounter: Payer: Medicaid Other | Admitting: Gastroenterology

## 2017-06-11 NOTE — Telephone Encounter (Signed)
Please let him know he must call us in advance if he cannot afford a procedure. Please reschedule him again if he is willing. If he no-shows again without advance notice he will be charged a cancellation fee. Thanks

## 2017-06-12 NOTE — Telephone Encounter (Signed)
Called pt to try to reschedule. Says he will call back when he is ready. Advised him about the cancel policy.

## 2017-06-18 ENCOUNTER — Telehealth: Payer: Self-pay | Admitting: Gastroenterology

## 2017-06-19 NOTE — Telephone Encounter (Signed)
Spoke to patient, let him know that there is no medical reason for him to be pre-admitted to the hospital for help with prep. He is rescheduled for 07/14/17 at [facility] and new instructions mailed out.

## 2017-06-23 ENCOUNTER — Telehealth: Payer: Self-pay | Admitting: Gastroenterology

## 2017-06-23 NOTE — Telephone Encounter (Signed)
I agree with your assessment. He otherwise warrants a colonoscopy to evaluate if he has active Crohn's - CT scan suggested possible inflammation, not definitive. If we have openings prior to 10/8 we can add him in, or do a 730 case for him if he wishes to be evaluated sooner. Thanks

## 2017-06-23 NOTE — Telephone Encounter (Signed)
Patient called, he is scheduled for colonoscopy on 07/14/17, currently he is having difficulty with diarrhea, has not tried imodium. Suggested he try this to help with symptoms. Patient also states that some food make him more symptomatic than others, suggested he make a list of the ones that cause problems and stay away from them.

## 2017-06-23 NOTE — Telephone Encounter (Signed)
Left message for patient to call back  

## 2017-06-25 ENCOUNTER — Telehealth: Payer: Self-pay | Admitting: Gastroenterology

## 2017-06-26 MED ORDER — NA SULFATE-K SULFATE-MG SULF 17.5-3.13-1.6 GM/177ML PO SOLN: 1.0000 | Freq: Once | ORAL | 0 refills | Status: AC

## 2017-06-26 MED ORDER — NA SULFATE-K SULFATE-MG SULF 17.5-3.13-1.6 GM/177ML PO SOLN: 1.0000 | Freq: Once | ORAL | 0 refills | Status: DC

## 2017-06-26 NOTE — Telephone Encounter (Signed)
Needs Prep sent to [facility] [place] please

## 2017-06-26 NOTE — Telephone Encounter (Signed)
Sent prep to [facility] as per pt request.  Attempted pt and the number states the person cannot take your call at this time, not able to leave message   Christian Lamb

## 2017-06-26 NOTE — Telephone Encounter (Signed)
Patient now states he needs prep sent to [facility] on [address].

## 2017-06-26 NOTE — Telephone Encounter (Signed)
Resent Suprep to [facility] at [employer] request - Attempted to call and notify pt, number states this person is not accepting calls at this time, cannot leave message - I called [employer] and cancelled the prep I sent there   Marijean Niemann

## 2017-07-14 ENCOUNTER — Encounter: Payer: Self-pay | Admitting: Gastroenterology

## 2017-07-14 ENCOUNTER — Ambulatory Visit (AMBULATORY_SURGERY_CENTER): Payer: Medicaid Other | Admitting: Gastroenterology

## 2017-07-14 VITALS — BP 98/74 | HR 83 | Temp 99.0°F | Resp 13 | Ht 71.25 in | Wt 131.0 lb

## 2017-07-14 DIAGNOSIS — R197 Diarrhea, unspecified: Secondary | ICD-10-CM

## 2017-07-14 DIAGNOSIS — K639 Disease of intestine, unspecified: Secondary | ICD-10-CM | POA: Diagnosis not present

## 2017-07-14 DIAGNOSIS — K633 Ulcer of intestine: Secondary | ICD-10-CM | POA: Diagnosis not present

## 2017-07-14 DIAGNOSIS — R634 Abnormal weight loss: Secondary | ICD-10-CM

## 2017-07-14 DIAGNOSIS — Z9049 Acquired absence of other specified parts of digestive tract: Secondary | ICD-10-CM | POA: Diagnosis not present

## 2017-07-14 MED ORDER — BUDESONIDE 3 MG PO CPEP: 9.0000 mg | ORAL_CAPSULE | Freq: Every day | ORAL | 0 refills | Status: DC

## 2017-07-14 MED ORDER — SODIUM CHLORIDE 0.9 % IV SOLN: 500.0000 mL | INTRAVENOUS | Status: DC

## 2017-07-14 NOTE — Op Note (Signed)
[facility] Patient Name: Christian Lamb Procedure Date: 07/14/2017 11:35 AM MRN: [id number] Endoscopist: Remo Lipps P. 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 The patient's tolerance of                            previous anesthesia was also reviewed. The risks                            and benefits of the procedure and the sedation                            options and risks were discussed with the patient.                            All questions were answered, and informed consent                            was obtained. Prior Anticoagulants: The patient has                            taken no previous anticoagulant or antiplatelet                            agents. ASA Grade Assessment: II - A patient with                            mild systemic disease. After reviewing the risks                            and benefits, the patient was deemed in  satisfactory condition to undergo the procedure.                           After obtaining informed consent, the endoscope was                            passed under direct vision. Throughout the                            procedure, the patient's blood pressure, pulse, and                            oxygen saturations were monitored continuously. The                            Colonoscope was  introduced through the anus and                            advanced to the the ileoanal pouch and into the                            neo-terminal ileum. After obtaining informed                            consent, the endoscope was passed under direct                            vision. Throughout the procedure, the patient's                            blood pressure, pulse, and oxygen saturations were                            monitored continuously.The procedure was performed                            without difficulty. The patient tolerated the                            procedure well. The quality of the bowel                            preparation was adequate. Scope In: 11:51:09 AM Scope Out: 11:57:59 AM Total Procedure Duration: 0 hours 6 minutes 50 seconds  Findings:                 Patient is status-post total colectomy with an                            ileal pouch-anal anastomosis.                           Diffuse inflammation, moderate in severity and  characterized by mucus and deep ulcerations was                            found in the ileoanal pouch and distal neo-terminal                            ileum. Biopsies were taken with a cold forceps for                            histology. More proximal ileum was evaluated with                            poor prep but inflammation seemed less more                            proximally.                           The perianal exam findings include irritated /                            erytnematous mucosa with nodularity. Complications:            No immediate complications. Estimated blood loss:                            Minimal. Estimated Blood Loss:     Estimated blood loss was minimal. 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 This is less likely pouchitis. Biopsied.                           - Irritated / erytnematous mucosa  with nodularity                            found on perianal exam - suspect irritation due to                            diarrhea. Recommendation:           - Discharge patient to home.                           - Resume previous diet.                           - Continue present medications.                           - Await pathology results with further                            recommendations                           -  Trial of budesonide 67m / day versus topical                            therapy (will discuss with patient, I am concerned                            about compliance with topical therapy) Christian LippsP. Tayshun Gappa MD, MD 07/14/2017 12:11:09 PM This report has been signed electronically.

## 2017-07-14 NOTE — Progress Notes (Signed)
A/ox3 pleased with MAC, [facility]

## 2017-07-14 NOTE — Progress Notes (Signed)
Called to room to assist during endoscopic procedure.  Patient ID and intended procedure confirmed with present staff. Received instructions for my participation in the procedure from the performing physician. maw

## 2017-07-14 NOTE — Patient Instructions (Addendum)
YOU HAD AN ENDOSCOPIC PROCEDURE TODAY AT [facility]:   Refer to the procedure report that was given to you for any specific questions about what was found during the examination.  If the procedure report does not answer your questions, please call your gastroenterologist to clarify.  If you requested that your care partner not be given the details of your procedure findings, then the procedure report has been included in a sealed envelope for you to review at your convenience later.  YOU SHOULD EXPECT: Some feelings of bloating in the abdomen. Passage of more gas than usual.  Walking can help get rid of the air that was put into your GI tract during the procedure and reduce the bloating. If you had a lower endoscopy (such as a colonoscopy or flexible sigmoidoscopy) you may notice spotting of blood in your stool or on the toilet paper. If you underwent a bowel prep for your procedure, you may not have a normal bowel movement for a few days.  Please Note:  You might notice some irritation and congestion in your nose or some drainage.  This is from the oxygen used during your procedure.  There is no need for concern and it should clear up in a day or so.  SYMPTOMS TO REPORT IMMEDIATELY:   Following lower endoscopy (colonoscopy or flexible sigmoidoscopy):  Excessive amounts of blood in the stool  Significant tenderness or worsening of abdominal pains  Swelling of the abdomen that is new, acute  Fever of 100F or higher  Patient has a pouchoscopy.  For urgent or emergent issues, a gastroenterologist can be reached at any hour by calling [phone].   DIET:  We do recommend a small meal at first, but then you may proceed to your regular diet.  Drink plenty of fluids but you should avoid alcoholic beverages for 24 hours.  MEDICATIONS: Continue present medications. Trial of Bedesonide 60m daily for 30 days versus topical therapy (Dr. AHavery Morosdiscussed with patient). Medication  electronically sent to patient's pharmacy of choice.  ACTIVITY:  You should plan to take it easy for the rest of today and you should NOT DRIVE or use heavy machinery until tomorrow (because of the sedation medicines used during the test).    FOLLOW UP: Our staff will call the number listed on your records the next business day following your procedure to check on you and address any questions or concerns that you may have regarding the information given to you following your procedure. If we do not reach you, we will leave a message.  However, if you are feeling well and you are not experiencing any problems, there is no need to return our call.  We will assume that you have returned to your regular daily activities without incident.  If any biopsies were taken you will be contacted by phone or by letter within the next 1-3 weeks.  Please call [facility] ([phone] you have not heard about the biopsies in 3 weeks.   Thank you for allowing [facility] provide for your healthcare needs today.  SIGNATURES/CONFIDENTIALITY: You and/or your care partner have signed paperwork which will be entered into your electronic medical record.  These signatures attest to the fact that that the information above on your After Visit Summary has been reviewed and is understood.  Full responsibility of the confidentiality of this discharge information lies with you and/or your care-partner.

## 2017-07-14 NOTE — Progress Notes (Signed)
A/ox3 pleased with MAC, report to Penny RN 

## 2017-07-15 ENCOUNTER — Telehealth: Payer: Self-pay

## 2017-07-15 NOTE — Telephone Encounter (Signed)
  Follow up Call-  Call Sameul Tagle number 07/14/2017  Post procedure Call Shakeyla Giebler phone  # [phone]  Permission to leave phone message Yes  Some recent data might be hidden     Patient questions:  Do you have a fever, pain , or abdominal swelling? No. Pain Score  0 *  Have you tolerated food without any problems? Yes.    Have you been able to return to your normal activities? Yes.    Do you have any questions about your discharge instructions: Diet   No. Medications  No. Follow up visit  No.  Do you have questions or concerns about your Care? No.  Actions: * If pain score is 4 or above: No action needed, pain <4.

## 2017-07-23 ENCOUNTER — Other Ambulatory Visit: Payer: Self-pay

## 2017-07-23 MED ORDER — BUDESONIDE 3 MG PO CPEP: 6.0000 mg | ORAL_CAPSULE | ORAL | 0 refills | Status: DC

## 2017-07-28 ENCOUNTER — Telehealth: Payer: Self-pay | Admitting: Gastroenterology

## 2017-07-28 NOTE — Telephone Encounter (Signed)
Looking back at colonoscopy report, states resume previous diet. What kind of diet should he be on?

## 2017-07-28 NOTE — Telephone Encounter (Signed)
Regular diet is fine, unless there are specific foods which bother him, he should then avoid those

## 2017-07-28 NOTE — Telephone Encounter (Signed)
Patient advised to maintain a regular diet, avoid any triggering foods. We also discussed the Budesonide, sent in Rx last week for taper dose. He will start this after he completes the 9 mg for one month. I told him this should be at his pharmacy, and if he was not going to pick it up now needs to call pharmacy.

## 2017-08-06 ENCOUNTER — Other Ambulatory Visit: Payer: Self-pay | Admitting: Gastroenterology

## 2017-08-06 DIAGNOSIS — R634 Abnormal weight loss: Secondary | ICD-10-CM

## 2017-08-06 DIAGNOSIS — R197 Diarrhea, unspecified: Secondary | ICD-10-CM

## 2017-08-12 ENCOUNTER — Telehealth: Payer: Self-pay | Admitting: Gastroenterology

## 2017-08-12 NOTE — Telephone Encounter (Signed)
Patient finished up Budesonide on Monday, 08/11/17. Today he started having looser stools, he was concerned on the reddish brown color. He has gained about 5 lbs. Patient has follow up in office on 09/23/17.

## 2017-08-12 NOTE — Telephone Encounter (Signed)
Hi Christian Lamb, he was supposed to take budesonide 35m / day for one month, then taper slowly, 631m/ day for 2 weeks, and then 40m35m day for 2 weeks. Did he do the taper? If not, put him back on budesonide 6mg100mday for 2 weeks and then 40mg 52may for 2 weeks, I will see him in the office around the time he finishes. If he did already do the taper I would put him back on 9mg /35my for another month if this helped him previously. Thanks

## 2017-08-13 ENCOUNTER — Telehealth: Payer: Self-pay

## 2017-08-13 ENCOUNTER — Other Ambulatory Visit: Payer: Self-pay

## 2017-08-13 MED ORDER — BUDESONIDE 3 MG PO CPEP: 6.0000 mg | ORAL_CAPSULE | ORAL | 0 refills | Status: DC

## 2017-08-13 NOTE — Telephone Encounter (Signed)
Patient called back, he changed his mind on where he would like the Budesonide sent to. Would [facility]. Resent.

## 2017-08-13 NOTE — Telephone Encounter (Signed)
Patient did not understand directions, he did not do the taper dosage. He will go pick up the taper dosage budesonide. I had let him know again, that this medications was sent to [facility] on 10/17. Keep follow up appointment on 12/18.

## 2017-09-09 ENCOUNTER — Telehealth: Payer: Self-pay | Admitting: Gastroenterology

## 2017-09-09 NOTE — Telephone Encounter (Signed)
Spoke to patient, he understands to complete the course of Budesonide and keep his follow up appointment on 12/18 to discuss further treatment.

## 2017-09-23 ENCOUNTER — Ambulatory Visit: Payer: Self-pay | Admitting: Gastroenterology

## 2017-10-03 ENCOUNTER — Other Ambulatory Visit: Payer: Self-pay

## 2017-10-03 ENCOUNTER — Encounter (HOSPITAL_COMMUNITY): Payer: Self-pay | Admitting: Emergency Medicine

## 2017-10-03 ENCOUNTER — Emergency Department (HOSPITAL_COMMUNITY)
Admission: EM | Admit: 2017-10-03 | Discharge: 2017-10-03 | Disposition: A | Discharge status: Home / Self Care | Payer: Medicaid Other | Attending: Emergency Medicine | Admitting: Emergency Medicine

## 2017-10-03 DIAGNOSIS — Z79899 Other long term (current) drug therapy: Secondary | ICD-10-CM | POA: Diagnosis not present

## 2017-10-03 DIAGNOSIS — D649 Anemia, unspecified: Secondary | ICD-10-CM

## 2017-10-03 DIAGNOSIS — F17210 Nicotine dependence, cigarettes, uncomplicated: Secondary | ICD-10-CM | POA: Insufficient documentation

## 2017-10-03 DIAGNOSIS — R000 Tachycardia, unspecified: Secondary | ICD-10-CM | POA: Diagnosis not present

## 2017-10-03 DIAGNOSIS — R197 Diarrhea, unspecified: Secondary | ICD-10-CM | POA: Insufficient documentation

## 2017-10-03 DIAGNOSIS — K529 Noninfective gastroenteritis and colitis, unspecified: Secondary | ICD-10-CM

## 2017-10-03 LAB — URINALYSIS, ROUTINE W REFLEX MICROSCOPIC
BILIRUBIN URINE: NEGATIVE
GLUCOSE, UA: NEGATIVE mg/dL
HGB URINE DIPSTICK: NEGATIVE
Ketones, ur: 5 mg/dL — AB
NITRITE: NEGATIVE
PROTEIN: NEGATIVE mg/dL
Specific Gravity, Urine: 1.028 (ref 1.005–1.030)
pH: 5 (ref 5.0–8.0)

## 2017-10-03 LAB — CBC
HEMATOCRIT: 37.8 % — AB (ref 39.0–52.0)
HEMOGLOBIN: 11.8 g/dL — AB (ref 13.0–17.0)
MCH: 28.2 pg (ref 26.0–34.0)
MCHC: 31.2 g/dL (ref 30.0–36.0)
MCV: 90.4 fL (ref 78.0–100.0)
Platelets: 293 10*3/uL (ref 150–400)
RBC: 4.18 MIL/uL — AB (ref 4.22–5.81)
RDW: 13.2 % (ref 11.5–15.5)
WBC: 12.1 10*3/uL — AB (ref 4.0–10.5)

## 2017-10-03 LAB — COMPREHENSIVE METABOLIC PANEL
ALT: 12 U/L — ABNORMAL LOW (ref 17–63)
ANION GAP: 4 — AB (ref 5–15)
AST: 16 U/L (ref 15–41)
Albumin: 2.7 g/dL — ABNORMAL LOW (ref 3.5–5.0)
Alkaline Phosphatase: 71 U/L (ref 38–126)
BUN: 10 mg/dL (ref 6–20)
CHLORIDE: 106 mmol/L (ref 101–111)
CO2: 27 mmol/L (ref 22–32)
Calcium: 8.5 mg/dL — ABNORMAL LOW (ref 8.9–10.3)
Creatinine, Ser: 0.89 mg/dL (ref 0.61–1.24)
Glucose, Bld: 98 mg/dL (ref 65–99)
POTASSIUM: 3.5 mmol/L (ref 3.5–5.1)
Sodium: 137 mmol/L (ref 135–145)
TOTAL PROTEIN: 7.7 g/dL (ref 6.5–8.1)
Total Bilirubin: 0.3 mg/dL (ref 0.3–1.2)

## 2017-10-03 LAB — LIPASE, BLOOD: LIPASE: 23 U/L (ref 11–51)

## 2017-10-03 NOTE — ED Triage Notes (Signed)
Pt. Stated, I feel like I HAVE TO GO TO THE BATHROOM ALL THE TIME LIKE DIARRHEA. THIS STARTED 2 WEEKS AGO.

## 2017-10-03 NOTE — Discharge Instructions (Signed)
Your work up today has been reassuring. Your diarrhea is likely related to your diet, please try to avoid foods that you know aggravate your symptoms. You may consider using over the counter tums, maalox, pepto bismol, or other over the counter remedies to help with symptoms. Stay well hydrated with small sips of fluids throughout the day. Follow a BRAT (banana-rice-applesauce-toast) diet as described below for the next 24-48 hours. The 'BRAT' diet is suggested, then progress to diet as tolerated as symptoms abate. Call your regular doctor if bloody stools, persistent diarrhea, vomiting, fever or abdominal pain. Follow up with your gastroenterologist in 1 week for recheck of symptoms. Return to ER for changing or worsening of symptoms.

## 2017-10-03 NOTE — ED Provider Notes (Signed)
[facility] EMERGENCY DEPARTMENT Provider Note   CSN: [id number] Arrival date & time: 10/03/17  1206     History   Chief Complaint Chief Complaint  Patient presents with  . Diarrhea    HPI Christian Lamb is a 41 y.o. 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 Chart review reveals that he's been dealing with some chronic diarrhea for quite some time, has been seen by Dr. Havery Moros at [facility] for a pouchoscopy on 07/14/17 which revealed diffuse moderate inflammation characterized by mucus and deep ulcerations in the ileoanal pouch and distal neo-terminal ileum, less inflammation in ileum proximally, and perianal area was irritated. He was started on budesonide and advised to f/up in the office on 09/23/17.  He states he had an appt elsewhere that same day so he had to cancel his appointment.  He reports that he finished the budesonide at some time towards the end of November (looks like last rx was 08/13/17 and he was to take 2 capsules daily x2 wks then cut down to 1 capsule daily x2 wks; and he called Dr. Doyne Keel office on 09/09/17 and stated he was running low and requested a refill, to which they stated to continue until finished and f/up with them on 09/23/17 to discuss next treatment option).  He states that when he finished the budesonide towards the end of November, his diarrhea had stopped, however he began having diarrhea again about 3 weeks ago but only with certain foods such as dairy products and sodas.  He states that when he consumes these products, he has approximately 6 episodes daily of nonbloody loose and sometimes watery diarrhea, but only with dairy or soda consumption.  He has not tried  anything else for his symptoms.  Due to missing the appointment on 09/23/17, he has rescheduled it for later in November, but he came here today to get checked up on his Crohn's.  He denies fevers, chills, CP, SOB, abd pain, nausea/vomiting, constipation, obstipation, melena, hematochezia, hematuria, dysuria, myalgias, arthralgias, numbness, tingling, focal weakness, or any other complaints at this time. Denies recent travel, sick contacts, suspicious food intake, EtOH use, or frequent NSAID use.    The history is provided by the patient and medical records. No language interpreter was used.  Diarrhea   Pertinent negatives include no abdominal pain, no vomiting, no chills, no arthralgias and no myalgias.    Past Medical History:  Diagnosis Date  . Anemia   . Anxiety   . Schizophrenia ([place])   . Ulcerative colitis [facility])     Patient Active Problem List   Diagnosis Date Noted  . Loss of weight 03/21/2017  . Chronic diarrhea 03/21/2017  . Ulcerative colitis with complication ([place]) 34/19/3790  . Tobacco use disorder 02/12/2016  . Schizoaffective disorder, bipolar type ([place]) 02/11/2016  . Chronic tachycardia 01/13/2015  . Chronic ulcerative enterocolitis ([place]) 01/13/2015  . 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 SMALL INTESTINE SURGERY  2016   in [place]  . TOTAL COLECTOMY  2003       Home Medications    Prior to Admission medications   Medication Sig Start Date End Date Taking? Authorizing Provider  B Complex Vitamins (B COMPLEX PO) Take 1 capsule by  mouth daily.    [provider]  benztropine (COGENTIN) 0.5 MG tablet Take 0.5 mg by mouth Nightly.    [provider]  budesonide (ENTOCORT EC) 3 MG 24 hr capsule Take 2 capsules (6 mg total) as directed by mouth. Take 2 capsules for two weeks, then taper down to 1 capsule daily for two weeks. 08/13/17   Armbruster, Carlota Raspberry, MD  paliperidone  (INVEGA SUSTENNA) 156 MG/ML SUSP injection Inject 1 mL (156 mg total) into the muscle once. 02/20/16   Hildred Priest, MD  paliperidone (INVEGA) 6 MG 24 hr tablet Take 2 tablets (12 mg total) by mouth at bedtime. 02/15/16   Hildred Priest, MD    Family History Family History  Problem Relation Age of Onset  . Colon cancer Father 38  . Stomach cancer Neg Hx     Social History Social History   Tobacco Use  . Smoking status: Current Every Day Smoker    Packs/day: 0.25    Types: Cigarettes, Cigars  . Smokeless tobacco: Never Used  Substance Use Topics  . Alcohol use: No    Alcohol/week: 0.0 oz  . Drug use: No     Allergies   Patient has no known allergies.   Review of Systems Review of Systems  Constitutional: Negative for chills and fever.  Respiratory: Negative for shortness of breath.   Cardiovascular: Negative for chest pain.  Gastrointestinal: Positive for diarrhea. Negative for abdominal pain, blood in stool, constipation, nausea and vomiting.  Genitourinary: Negative for dysuria and hematuria.  Musculoskeletal: Negative for arthralgias and myalgias.  Skin: Negative for color change.  Allergic/Immunologic: Negative for immunocompromised state.  Neurological: Negative for weakness and numbness.  Psychiatric/Behavioral: Negative for confusion.   All other systems reviewed and are negative for acute change except as noted in the HPI.    Physical Exam Updated Vital Signs BP 132/72 (BP Location: Right Arm)   Pulse (!) 127   Temp 98.2 F (36.8 C) (Oral)   Resp (!) 22   Ht 6' (1.829 m)   Wt 61.7 kg (136 lb)   SpO2 98%   BMI 18.44 kg/m   Physical Exam  Constitutional: He is oriented to person, place, and time. He appears well-developed and well-nourished.  Non-toxic appearance. No distress.  Afebrile, nontoxic, NAD, poor hygiene   HENT:  Head: Normocephalic and atraumatic.  Mouth/Throat: Oropharynx is clear and moist and mucous membranes  are normal.  Eyes: Conjunctivae and EOM are normal. Right eye exhibits no discharge. Left eye exhibits no discharge.  Neck: Normal range of motion. Neck supple.  Cardiovascular: Regular rhythm, normal heart sounds and intact distal pulses. Tachycardia present. Exam reveals no gallop and no friction rub.  No murmur heard. Tachycardic similar to prior visits  Pulmonary/Chest: Effort normal and breath sounds normal. No respiratory distress. He has no decreased breath sounds. He has no wheezes. He has no rhonchi. He has no rales.  Abdominal: Soft. Normal appearance and bowel sounds are normal. He exhibits no distension. There is no tenderness. There is no rigidity, no rebound, no guarding, no CVA tenderness, no tenderness at McBurney's point and negative Murphy's sign.  Soft, NTND, +BS throughout, no r/g/r, neg murphy's, neg mcburney's, no CVA TTP   Musculoskeletal: Normal range of motion.  Neurological: He is alert and oriented to person, place, and time. He has normal strength. No sensory deficit.  Skin: Skin is warm, dry and intact. No rash noted.  Psychiatric: He has a normal mood and affect.  Nursing note and vitals reviewed.    ED Treatments / Results  Labs (all labs ordered are listed, but only abnormal results are displayed) Labs Reviewed  COMPREHENSIVE METABOLIC PANEL - Abnormal; Notable for the following components:      Result Value   Calcium 8.5 (*)    Albumin 2.7 (*)    ALT 12 (*)    Anion gap 4 (*)    All other components within normal limits  CBC - Abnormal; Notable for the following components:   WBC 12.1 (*)    RBC 4.18 (*)    Hemoglobin 11.8 (*)    HCT 37.8 (*)    All other components within normal limits  URINALYSIS, ROUTINE W REFLEX MICROSCOPIC - Abnormal; Notable for the following components:   APPearance CLOUDY (*)    Ketones, ur 5 (*)    Leukocytes, UA TRACE (*)    Bacteria, UA RARE (*)    Squamous Epithelial / LPF 0-5 (*)    All other components within  normal limits  LIPASE, BLOOD    EKG  EKG Interpretation None       Radiology No results found.   CT entero abd/pelv w/contrast 03/28/17: IMPRESSION: 1. Inner wall hyper enhancement associated with the rectal remnant and distal ileum at the ileo rectal anastomosis. There may be some minimal associated bowel wall thickening, but no substantial adjacent free fluid. 2. Mild lymphadenopathy in the inferior mesenteric and lower retroperitoneal distribution.  Electronically Signed   By: Misty Stanley M.D.   On: 03/28/2017 11:51    Procedures Procedures (including critical care time)   07/14/17 Pouchoscopy: Patient is status-post total colectomy with an ileal pouch-anal anastomosis. Diffuse inflammation, moderate in severity and characterized by mucus and deep ulcerations was found in the ileoanal pouch and distal neo-terminal ileum. Biopsies were taken with a cold forceps for histology. More proximal ileum was evaluated with poor prep but inflammation seemed less more proximally. The perianal exam findings include irritated / erytnematous mucosa with nodularity.   Medications Ordered in ED Medications - No data to display   Initial Impression / Assessment and Plan / ED Course  I have reviewed the triage vital signs and the nursing notes.  Pertinent labs & imaging results that were available during my care of the patient were reviewed by me and considered in my medical decision making (see chart for details).     41 y.o. male here to "check up on his crohn's". Pt states he has diarrhea with certain foods, had improved with budesonide rx that Dr. Havery Moros ([facility]) had him on after his pouchoscopy on 07/14/17 (which showed diffuse moderate inflammation in ileoanal pouch and distal neo-terminal ileum); states when he eats dairy or soda he gets diarrhea. Denies any other complaints, states he was just here to get checked up. On exam, tachycardic similar to prior visits; no  abdominal tenderness; poor hygiene; afebrile and nontoxic. Work up showing: lipase WNL, CMP with hypoalbuminemia similar to prior but otherwise unremarkable, CBC with marginally elevated WBC 12.1 similar to prior, and stable anemia, U/A mildly contaminated but without definite evidence of infection. Given lack of abdominal tenderness or other concerning s/sx/lab findings, doubt need for CT imaging at this time. Doubt acute emergent pathology. Advised pt f/up with his GI specialist for ongoing management of his chronic diarrhea and UC/Crohn's. Doubt need for restarting steroid treatment, since he was supposed to come off of it this month and f/up with them; he has an appt in January,  advised that he try to move it up. Discussed avoiding known triggers, and BRAT diet. Advised adequate hydration. F/up with GI as soon as possible for ongoing management of his condition. I explained the diagnosis and have given explicit precautions to return to the ER including for any other new or worsening symptoms. The patient understands and accepts the medical plan as it's been dictated and I have answered their questions. Discharge instructions concerning home care and prescriptions have been given. The patient is STABLE and is discharged to home in good condition.    Final Clinical Impressions(s) / ED Diagnoses   Final diagnoses:  Chronic diarrhea  Chronic anemia  Chronic tachycardia    ED Discharge Orders    [address] [place], [place] 10/03/17 1853    Davonna Belling, MD 10/04/17 [phone]

## 2017-10-03 NOTE — ED Notes (Addendum)
Pt seen by Dewitt Hoes, PA prior to this RN seeing pt. Pt placed up for discharge, pt and family not in the room when RN arrived. No assessment performed. No discharge vitals done.

## 2017-10-30 ENCOUNTER — Ambulatory Visit: Payer: Self-pay | Admitting: Gastroenterology

## 2017-11-03 ENCOUNTER — Ambulatory Visit: Payer: Self-pay | Admitting: Gastroenterology

## 2017-11-04 ENCOUNTER — Telehealth: Payer: Self-pay | Admitting: Gastroenterology

## 2017-11-04 NOTE — Telephone Encounter (Signed)
Patient wanted to know if he was drinking enough water, 4 bottles. I have advised him to also try some Gatorade, try watering it down with the water to help with diarrhea.

## 2017-11-18 ENCOUNTER — Telehealth: Payer: Self-pay | Admitting: Gastroenterology

## 2017-11-18 NOTE — Telephone Encounter (Signed)
Almyra Free this patient has not followed up with me. He no showed his visit in December for this issue. I'm assuming he ran out of the budesonide he was taking which previously controlled his symptoms. 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 I will tell him how to taper it when I see him and discuss other options. If he is already taking this otherwise, please let me know. Thanks

## 2017-11-18 NOTE — Telephone Encounter (Signed)
Patient states that he has stopped taking the budesonide, has noticed that when he drinks water or eats something close to the time of going to bed, he will have diarrhea throughout the night. He states he is not having this issue during the day. I have instructed him to not have anything at least 3 hours prior to going to bed. He does have a follow up ov on 2/26.

## 2017-11-18 NOTE — Telephone Encounter (Signed)
Left message to call back  

## 2017-11-19 ENCOUNTER — Other Ambulatory Visit: Payer: Self-pay

## 2017-11-19 MED ORDER — BUDESONIDE 3 MG PO CPEP: 9.0000 mg | ORAL_CAPSULE | ORAL | 0 refills | Status: DC

## 2017-11-19 NOTE — Telephone Encounter (Signed)
Spoke to patient, he is not currently taking budesonide. Rx for budesonide 9 mg daily sent to [facility] pharmacy as requested by patient. Patient instructed and understands to keep his appointment for 2/26 for further instructions on medication.

## 2017-11-19 NOTE — Telephone Encounter (Signed)
Left message for patient to call back  

## 2017-11-24 ENCOUNTER — Telehealth: Payer: Self-pay

## 2017-11-24 NOTE — Telephone Encounter (Signed)
Let patient know that he should take 3 tablets all at one time, keep his follow up appointment for 2/26 at 9:00.

## 2017-12-02 ENCOUNTER — Ambulatory Visit: Payer: Self-pay | Admitting: Gastroenterology

## 2017-12-02 ENCOUNTER — Telehealth: Payer: Self-pay

## 2017-12-02 NOTE — Telephone Encounter (Signed)
No show letter sent to pt 12-02-17

## 2017-12-02 NOTE — Telephone Encounter (Signed)
error 

## 2017-12-02 NOTE — Telephone Encounter (Signed)
-----   Message from Yetta Flock, MD sent at 12/02/2017 12:58 PM EST ----- Regarding: RE: cancellations Thanks Jan, Please let him know if he does not follow up with our office at his next scheduled appointment he will be discharged from the practice and have to seek care elsewhere. Thanks  ----- Message ----- From: Roetta Sessions, CMA Sent: 12/02/2017  11:33 AM To: Yetta Flock, MD Subject: cancellations                                  FYI: Pt cancelled 2 procedures (one on the day of the procedure): (July 2018 and Sept 2018) and 3 OVs (Dec. 2018, Oct 30, 2017 and Nov 03, 2017) before No showing today. He cancelled his last 3 appts about a week in advance. I will send him a No Show letter today if you are ok with him rescheduling.   Please advise. Thanks, Jan

## 2017-12-04 ENCOUNTER — Inpatient Hospital Stay (HOSPITAL_COMMUNITY): Payer: Medicaid Other

## 2017-12-04 ENCOUNTER — Emergency Department (HOSPITAL_COMMUNITY): Payer: Medicaid Other

## 2017-12-04 ENCOUNTER — Other Ambulatory Visit: Payer: Self-pay

## 2017-12-04 ENCOUNTER — Encounter (HOSPITAL_COMMUNITY): Payer: Self-pay | Admitting: Emergency Medicine

## 2017-12-04 ENCOUNTER — Inpatient Hospital Stay (HOSPITAL_COMMUNITY)
Admission: EM | Admit: 2017-12-04 | Discharge: 2017-12-18 | DRG: 871 | Disposition: A | Discharge status: Home Health | Payer: Medicaid Other | Attending: Family Medicine | Admitting: Family Medicine

## 2017-12-04 DIAGNOSIS — K51018 Ulcerative (chronic) pancolitis with other complication: Secondary | ICD-10-CM | POA: Diagnosis not present

## 2017-12-04 DIAGNOSIS — L98419 Non-pressure chronic ulcer of buttock with unspecified severity: Secondary | ICD-10-CM | POA: Diagnosis present

## 2017-12-04 DIAGNOSIS — K5100 Ulcerative (chronic) pancolitis without complications: Secondary | ICD-10-CM | POA: Diagnosis present

## 2017-12-04 DIAGNOSIS — R634 Abnormal weight loss: Secondary | ICD-10-CM | POA: Diagnosis not present

## 2017-12-04 DIAGNOSIS — R64 Cachexia: Secondary | ICD-10-CM | POA: Diagnosis present

## 2017-12-04 DIAGNOSIS — K51919 Ulcerative colitis, unspecified with unspecified complications: Secondary | ICD-10-CM | POA: Diagnosis present

## 2017-12-04 DIAGNOSIS — A419 Sepsis, unspecified organism: Principal | ICD-10-CM | POA: Diagnosis present

## 2017-12-04 DIAGNOSIS — N179 Acute kidney failure, unspecified: Secondary | ICD-10-CM | POA: Diagnosis present

## 2017-12-04 DIAGNOSIS — M6282 Rhabdomyolysis: Secondary | ICD-10-CM | POA: Diagnosis present

## 2017-12-04 DIAGNOSIS — E860 Dehydration: Secondary | ICD-10-CM | POA: Diagnosis present

## 2017-12-04 DIAGNOSIS — D638 Anemia in other chronic diseases classified elsewhere: Secondary | ICD-10-CM | POA: Diagnosis present

## 2017-12-04 DIAGNOSIS — E875 Hyperkalemia: Secondary | ICD-10-CM | POA: Diagnosis present

## 2017-12-04 DIAGNOSIS — S71002A Unspecified open wound, left hip, initial encounter: Secondary | ICD-10-CM | POA: Diagnosis present

## 2017-12-04 DIAGNOSIS — F250 Schizoaffective disorder, bipolar type: Secondary | ICD-10-CM | POA: Diagnosis present

## 2017-12-04 DIAGNOSIS — Z681 Body mass index (BMI) 19 or less, adult: Secondary | ICD-10-CM | POA: Diagnosis not present

## 2017-12-04 DIAGNOSIS — R748 Abnormal levels of other serum enzymes: Secondary | ICD-10-CM | POA: Diagnosis not present

## 2017-12-04 DIAGNOSIS — X31XXXA Exposure to excessive natural cold, initial encounter: Secondary | ICD-10-CM

## 2017-12-04 DIAGNOSIS — E43 Unspecified severe protein-calorie malnutrition: Secondary | ICD-10-CM | POA: Diagnosis present

## 2017-12-04 DIAGNOSIS — S31819A Unspecified open wound of right buttock, initial encounter: Secondary | ICD-10-CM | POA: Diagnosis present

## 2017-12-04 DIAGNOSIS — T34832A Frostbite with tissue necrosis of left toe(s), initial encounter: Secondary | ICD-10-CM | POA: Diagnosis present

## 2017-12-04 DIAGNOSIS — N183 Chronic kidney disease, stage 3 (moderate): Secondary | ICD-10-CM | POA: Diagnosis present

## 2017-12-04 DIAGNOSIS — T34831A Frostbite with tissue necrosis of right toe(s), initial encounter: Secondary | ICD-10-CM | POA: Diagnosis present

## 2017-12-04 DIAGNOSIS — R358 Other polyuria: Secondary | ICD-10-CM | POA: Diagnosis not present

## 2017-12-04 DIAGNOSIS — F17210 Nicotine dependence, cigarettes, uncomplicated: Secondary | ICD-10-CM | POA: Diagnosis present

## 2017-12-04 DIAGNOSIS — D631 Anemia in chronic kidney disease: Secondary | ICD-10-CM | POA: Diagnosis present

## 2017-12-04 DIAGNOSIS — Y92032 Bedroom in apartment as the place of occurrence of the external cause: Secondary | ICD-10-CM

## 2017-12-04 DIAGNOSIS — E872 Acidosis: Secondary | ICD-10-CM | POA: Diagnosis present

## 2017-12-04 DIAGNOSIS — K91850 Pouchitis: Secondary | ICD-10-CM | POA: Diagnosis present

## 2017-12-04 DIAGNOSIS — S31829A Unspecified open wound of left buttock, initial encounter: Secondary | ICD-10-CM | POA: Diagnosis present

## 2017-12-04 DIAGNOSIS — R609 Edema, unspecified: Secondary | ICD-10-CM | POA: Diagnosis not present

## 2017-12-04 DIAGNOSIS — E876 Hypokalemia: Secondary | ICD-10-CM | POA: Diagnosis present

## 2017-12-04 DIAGNOSIS — F17290 Nicotine dependence, other tobacco product, uncomplicated: Secondary | ICD-10-CM | POA: Diagnosis present

## 2017-12-04 DIAGNOSIS — Z9114 Patient's other noncompliance with medication regimen: Secondary | ICD-10-CM

## 2017-12-04 DIAGNOSIS — R531 Weakness: Secondary | ICD-10-CM | POA: Diagnosis not present

## 2017-12-04 DIAGNOSIS — R0602 Shortness of breath: Secondary | ICD-10-CM | POA: Diagnosis present

## 2017-12-04 DIAGNOSIS — K529 Noninfective gastroenteritis and colitis, unspecified: Secondary | ICD-10-CM | POA: Diagnosis not present

## 2017-12-04 DIAGNOSIS — N3001 Acute cystitis with hematuria: Secondary | ICD-10-CM | POA: Diagnosis present

## 2017-12-04 DIAGNOSIS — F17200 Nicotine dependence, unspecified, uncomplicated: Secondary | ICD-10-CM

## 2017-12-04 DIAGNOSIS — Z800 Family history of malignant neoplasm of digestive organs: Secondary | ICD-10-CM

## 2017-12-04 DIAGNOSIS — Z9049 Acquired absence of other specified parts of digestive tract: Secondary | ICD-10-CM

## 2017-12-04 DIAGNOSIS — R197 Diarrhea, unspecified: Secondary | ICD-10-CM | POA: Diagnosis not present

## 2017-12-04 HISTORY — DX: Sepsis, unspecified organism: A41.9

## 2017-12-04 LAB — COMPREHENSIVE METABOLIC PANEL
ALBUMIN: 2 g/dL — AB (ref 3.5–5.0)
ALT: 88 U/L — ABNORMAL HIGH (ref 17–63)
ANION GAP: 11 (ref 5–15)
AST: 164 U/L — AB (ref 15–41)
Alkaline Phosphatase: 84 U/L (ref 38–126)
BILIRUBIN TOTAL: 1.6 mg/dL — AB (ref 0.3–1.2)
BUN: 56 mg/dL — AB (ref 6–20)
CHLORIDE: 110 mmol/L (ref 101–111)
CO2: 19 mmol/L — ABNORMAL LOW (ref 22–32)
Calcium: 7.5 mg/dL — ABNORMAL LOW (ref 8.9–10.3)
Creatinine, Ser: 1.56 mg/dL — ABNORMAL HIGH (ref 0.61–1.24)
GFR calc Af Amer: >60 mL/min (ref >60)
GFR, EST NON AFRICAN AMERICAN: 54 mL/min — AB (ref >60)
Glucose, Bld: 90 mg/dL (ref 65–99)
Potassium: 5.2 mmol/L — ABNORMAL HIGH (ref 3.5–5.1)
Sodium: 140 mmol/L (ref 135–145)
TOTAL PROTEIN: 7.4 g/dL (ref 6.5–8.1)

## 2017-12-04 LAB — URINALYSIS, ROUTINE W REFLEX MICROSCOPIC
BILIRUBIN URINE: NEGATIVE
Glucose, UA: NEGATIVE mg/dL
Ketones, ur: 5 mg/dL — AB
NITRITE: POSITIVE — AB
PH: 5 (ref 5.0–8.0)
Protein, ur: NEGATIVE mg/dL
Specific Gravity, Urine: 1.011 (ref 1.005–1.030)
Squamous Epithelial / LPF: NONE SEEN

## 2017-12-04 LAB — CK TOTAL AND CKMB (NOT AT ARMC)
CK TOTAL: 6022 U/L — AB (ref 49–397)
CK, MB: 50.2 ng/mL — AB (ref 0.5–5.0)
Relative Index: 0.8 (ref 0.0–2.5)

## 2017-12-04 LAB — CBC WITH DIFFERENTIAL/PLATELET
BASOS ABS: 0 10*3/uL (ref 0.0–0.1)
Basophils Relative: 0 %
EOS ABS: 0 10*3/uL (ref 0.0–0.7)
Eosinophils Relative: 0 %
HCT: 33.4 % — ABNORMAL LOW (ref 39.0–52.0)
Hemoglobin: 11 g/dL — ABNORMAL LOW (ref 13.0–17.0)
LYMPHS ABS: 2.6 10*3/uL (ref 0.7–4.0)
Lymphocytes Relative: 10 %
MCH: 28.5 pg (ref 26.0–34.0)
MCHC: 32.9 g/dL (ref 30.0–36.0)
MCV: 86.5 fL (ref 78.0–100.0)
MONO ABS: 1 10*3/uL (ref 0.1–1.0)
Monocytes Relative: 4 %
NEUTROS PCT: 86 %
Neutro Abs: 22.1 10*3/uL — ABNORMAL HIGH (ref 1.7–7.7)
Platelets: 354 10*3/uL (ref 150–400)
RBC: 3.86 MIL/uL — AB (ref 4.22–5.81)
RDW: 14.2 % (ref 11.5–15.5)
WBC: 25.7 10*3/uL — AB (ref 4.0–10.5)

## 2017-12-04 LAB — D-DIMER, QUANTITATIVE: D-Dimer, Quant: 7.69 ug/mL-FEU — ABNORMAL HIGH (ref 0.00–0.50)

## 2017-12-04 LAB — I-STAT TROPONIN, ED: TROPONIN I, POC: 0 ng/mL (ref 0.00–0.08)

## 2017-12-04 LAB — C DIFFICILE QUICK SCREEN W PCR REFLEX
C DIFFICILE (CDIFF) TOXIN: NEGATIVE
C Diff antigen: NEGATIVE
C Diff interpretation: NOT DETECTED

## 2017-12-04 LAB — RAPID URINE DRUG SCREEN, HOSP PERFORMED
AMPHETAMINES: NOT DETECTED
BARBITURATES: NOT DETECTED
Benzodiazepines: NOT DETECTED
COCAINE: NOT DETECTED
OPIATES: NOT DETECTED
TETRAHYDROCANNABINOL: NOT DETECTED

## 2017-12-04 LAB — ETHANOL

## 2017-12-04 LAB — LIPASE, BLOOD: LIPASE: 21 U/L (ref 11–51)

## 2017-12-04 LAB — I-STAT CG4 LACTIC ACID, ED: LACTIC ACID, VENOUS: 1.77 mmol/L (ref 0.5–1.9)

## 2017-12-04 LAB — PHOSPHORUS: Phosphorus: 4.1 mg/dL (ref 2.5–4.6)

## 2017-12-04 LAB — MAGNESIUM: Magnesium: 2.4 mg/dL (ref 1.7–2.4)

## 2017-12-04 LAB — PROCALCITONIN: Procalcitonin: 17.02 ng/mL

## 2017-12-04 MED ORDER — SODIUM CHLORIDE 0.9 % IV BOLUS (SEPSIS)
1000.0000 mL | Freq: Once | INTRAVENOUS | Status: AC
  Administered 2017-12-04: 1000 mL via INTRAVENOUS

## 2017-12-04 MED ORDER — SODIUM CHLORIDE 0.9 % IV BOLUS (SEPSIS): 1000.0000 mL | Freq: Once | INTRAVENOUS | Status: DC

## 2017-12-04 MED ORDER — ACETAMINOPHEN 500 MG PO TABS
1000.0000 mg | ORAL_TABLET | Freq: Once | ORAL | Status: AC
  Administered 2017-12-04: 1000 mg via ORAL
  Filled 2017-12-04: qty 2

## 2017-12-04 MED ORDER — ACETAMINOPHEN 650 MG RE SUPP: 650.0000 mg | Freq: Four times a day (QID) | RECTAL | Status: DC | PRN

## 2017-12-04 MED ORDER — VANCOMYCIN HCL IN DEXTROSE 750-5 MG/150ML-% IV SOLN: 750.0000 mg | INTRAVENOUS | Status: DC

## 2017-12-04 MED ORDER — ACETAMINOPHEN 325 MG PO TABS
650.0000 mg | ORAL_TABLET | Freq: Four times a day (QID) | ORAL | Status: DC | PRN
  Administered 2017-12-05 – 2017-12-15 (×4): 650 mg via ORAL
  Filled 2017-12-04 (×4): qty 2

## 2017-12-04 MED ORDER — BENZTROPINE MESYLATE 0.5 MG PO TABS: 0.5000 mg | ORAL_TABLET | Freq: Every evening | ORAL | Status: DC

## 2017-12-04 MED ORDER — ONDANSETRON HCL 4 MG/2ML IJ SOLN
4.0000 mg | Freq: Four times a day (QID) | INTRAMUSCULAR | Status: DC | PRN
  Administered 2017-12-09: 4 mg via INTRAVENOUS
  Filled 2017-12-04: qty 2

## 2017-12-04 MED ORDER — NICOTINE 21 MG/24HR TD PT24
21.0000 mg | MEDICATED_PATCH | TRANSDERMAL | Status: DC
  Administered 2017-12-04 – 2017-12-17 (×14): 21 mg via TRANSDERMAL
  Filled 2017-12-04 (×14): qty 1

## 2017-12-04 MED ORDER — PIPERACILLIN-TAZOBACTAM 3.375 G IVPB 30 MIN
3.3750 g | Freq: Once | INTRAVENOUS | Status: AC
  Administered 2017-12-04: 3.375 g via INTRAVENOUS
  Filled 2017-12-04: qty 50

## 2017-12-04 MED ORDER — IPRATROPIUM-ALBUTEROL 0.5-2.5 (3) MG/3ML IN SOLN: 3.0000 mL | RESPIRATORY_TRACT | Status: DC | PRN

## 2017-12-04 MED ORDER — ONDANSETRON HCL 4 MG PO TABS: 4.0000 mg | ORAL_TABLET | Freq: Four times a day (QID) | ORAL | Status: DC | PRN

## 2017-12-04 MED ORDER — SODIUM CHLORIDE 0.9 % IV SOLN
INTRAVENOUS | Status: DC
  Administered 2017-12-04 – 2017-12-06 (×3): via INTRAVENOUS
  Administered 2017-12-06: 1000 mL via INTRAVENOUS
  Administered 2017-12-06 – 2017-12-07 (×2): via INTRAVENOUS
  Administered 2017-12-07: 1000 mL via INTRAVENOUS
  Administered 2017-12-07: 21:00:00 via INTRAVENOUS

## 2017-12-04 MED ORDER — PIPERACILLIN-TAZOBACTAM 3.375 G IVPB
3.3750 g | Freq: Three times a day (TID) | INTRAVENOUS | Status: DC
  Administered 2017-12-05 – 2017-12-13 (×26): 3.375 g via INTRAVENOUS
  Filled 2017-12-04 (×24): qty 50

## 2017-12-04 MED ORDER — VANCOMYCIN HCL IN DEXTROSE 1-5 GM/200ML-% IV SOLN
1000.0000 mg | Freq: Once | INTRAVENOUS | Status: AC
  Administered 2017-12-04: 1000 mg via INTRAVENOUS
  Filled 2017-12-04: qty 200

## 2017-12-04 MED ORDER — BUDESONIDE 3 MG PO CPEP
9.0000 mg | ORAL_CAPSULE | Freq: Every day | ORAL | Status: DC
  Administered 2017-12-05 – 2017-12-18 (×14): 9 mg via ORAL
  Filled 2017-12-04 (×15): qty 3

## 2017-12-04 MED ORDER — BENZTROPINE MESYLATE 0.5 MG PO TABS
0.5000 mg | ORAL_TABLET | Freq: Every day | ORAL | Status: DC
  Administered 2017-12-04 – 2017-12-17 (×13): 0.5 mg via ORAL
  Filled 2017-12-04 (×15): qty 1

## 2017-12-04 MED ORDER — ENOXAPARIN SODIUM 40 MG/0.4ML ~~LOC~~ SOLN
40.0000 mg | SUBCUTANEOUS | Status: DC
  Administered 2017-12-04 – 2017-12-17 (×14): 40 mg via SUBCUTANEOUS
  Filled 2017-12-04 (×15): qty 0.4

## 2017-12-04 NOTE — ED Notes (Signed)
Patient shaking and states he's cold. Patient is saturated in urine and feces. Patient states that everything he eats for the past month runs through him and he cant control it. Patient was given a warm blanket.

## 2017-12-04 NOTE — ED Notes (Signed)
Condom cath placed on patient to obtain urine sample

## 2017-12-04 NOTE — ED Notes (Signed)
Patient has large areas of broken down skin on his hips and buttocks. While cleaning patient after BM patients backside started to bleed and patient moaned out in pain.

## 2017-12-04 NOTE — ED Provider Notes (Signed)
[facility]-ICU/STEPDOWN Provider Note   CSN: [id number] Arrival date & time: 12/04/17  1453     History   Chief Complaint Chief Complaint  Patient presents with  . Diarrhea  . Shortness of Breath    HPI Christian Lamb is a 41 y.o. male past medical history of ulcerative colitis, schizophrenia, chronic diarrhea who presents for evaluation of diarrhea and SOB.  Patient reports that he has a history of chronic diarrhea that is been ongoing for the last several months.  He has been followed by GI (Dr. Armbruster with [facility]) and started on budesonide to help with symptoms.  Patient was supposed to see GI on 12/02/17 but states that he did not make the appointment because he kept having diarrhea.  He reports several episodes of loose, watery stools that occur daily.  No blood noted in the stool.  She states that anything he eats "just runs through him."  He states that he will consistently have to urinate and have diarrhea frequently after eating.  Patient reports that a few days ago, he felt like he was having trouble breathing.  Patient reports that he starts coughing and hiccuping and feels like he cannot catch his breath.  No alleviating or aggravating factors.  Additionally, he states that this feels like he will vomit but states that he has never had any vomiting.  He denies any chest pain.  He states that he has had decreased appetite.  Patient does not know if he has been running fever but states that he has been having subjective chills.  Patient denies any chest pain, abdominal pain, leg edema.   The history is provided by the patient.    Past Medical History:  Diagnosis Date  . Anemia   . Anxiety   . Schizophrenia (HCC)   . Ulcerative colitis (HCC)     Patient Active Problem List   Diagnosis Date Noted  . Sepsis (HCC) 12/04/2017  . Loss of weight 03/21/2017  . Chronic diarrhea 03/21/2017  . Ulcerative colitis with complication (HCC) 03/21/2017  .  Tobacco use disorder 02/12/2016  . Schizoaffective disorder, bipolar type (HCC) 02/11/2016  . Chronic tachycardia 01/13/2015  . Chronic ulcerative enterocolitis (HCC) 01/13/2015  . 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 SMALL INTESTINE SURGERY  2016   in [place]  . TOTAL COLECTOMY  2003       Home Medications    Prior to Admission medications   Medication Sig Start Date End Date Taking? Authorizing Provider  B Complex Vitamins (B COMPLEX PO) Take 1 capsule by mouth daily.   Yes [provider]  benztropine (COGENTIN) 0.5 MG tablet Take 0.5 mg by mouth Nightly.   Yes [provider]  budesonide (ENTOCORT EC) 3 MG 24 hr capsule Take 3 capsules (9 mg total) by mouth as directed. Patient taking differently: Take 9 mg by mouth daily.  11/19/17  Yes Armbruster, Steven P, MD  paliperidone (INVEGA SUSTENNA) 156 MG/ML SUSP injection Inject 1 mL (156 mg total) into the muscle once. 02/20/16  Yes Hernandez-Gonzalez, Andrea, MD  paliperidone (INVEGA) 6 MG 24 hr tablet Take 2 tablets (12 mg total) by mouth at bedtime. 02/15/16   Hernandez-Gonzalez, Andrea, MD    Family History Family History  Problem Relation Age of Onset  . Colon cancer Father 52  . Stomach cancer Neg Hx     Social History Social History   Tobacco Use  . Smoking   status: Current Every Day Smoker    Packs/day: 0.25    Types: Cigarettes, Cigars  . Smokeless tobacco: Never Used  Substance Use Topics  . Alcohol use: No    Alcohol/week: 0.0 oz  . Drug use: No     Allergies   Patient has no known allergies.   Review of Systems Review of Systems  Constitutional: Positive for chills and fatigue. Negative for fever.  HENT: Negative for congestion.   Eyes: Negative for visual disturbance.  Respiratory: Positive for shortness of breath. Negative for cough.   Cardiovascular: Negative for chest pain.  Gastrointestinal: Positive for  diarrhea and nausea. Negative for abdominal pain and vomiting.  Genitourinary: Negative for dysuria and hematuria.  Musculoskeletal: Negative for back pain and neck pain.  Skin: Negative for rash.  Neurological: Negative for dizziness, weakness, numbness and headaches.  Psychiatric/Behavioral: Negative for confusion.  All other systems reviewed and are negative.    Physical Exam Updated Vital Signs BP (!) 104/59 (BP Location: Right Arm)   Pulse (!) 140   Temp (!) 102.3 F (39.1 C) (Oral)   Resp 15   Ht 6' (1.829 m) Comment: per patient  Wt 54.4 kg (120 lb)   SpO2 (!) 1%   BMI 16.27 kg/m   Physical Exam  Constitutional: He is oriented to person, place, and time. He appears well-developed and well-nourished.  Thin, frail appearing   HENT:  Head: Normocephalic and atraumatic.  Mouth/Throat: Oropharynx is clear and moist and mucous membranes are normal.  Eyes: Conjunctivae, EOM and lids are normal. Pupils are equal, round, and reactive to light.  Neck: Full passive range of motion without pain.  Cardiovascular: Regular rhythm, normal heart sounds and normal pulses. Tachycardia present. Exam reveals no gallop and no friction rub.  No murmur heard. Pulses:      Radial pulses are 2+ on the right side, and 2+ on the left side.  Unable to palpate DP pulses bilaterally. Good DP pulses via doppler.   Pulmonary/Chest: Effort normal and breath sounds normal. He has no wheezes. He has no rhonchi.  No evidence of respiratory distress. Able to speak in full sentences without difficulty.  On initial evaluation, will start coughing/hiccuping but appears distractible and when he starts talking to him, the action stopped.  Abdominal: Soft. Normal appearance. There is no tenderness. There is no rigidity, no guarding and no CVA tenderness.  Abdomen is soft, nondistended, nontender.  No rigidity, guarding.  No peritoneal signs.  No CVA tenderness bilaterally.  Musculoskeletal: Normal range of  motion.  Neurological: He is alert and oriented to person, place, and time. GCS eye subscore is 4. GCS verbal subscore is 5. GCS motor subscore is 6.  Follows commands, Moves all extremities  5/5 strength to BUE and BLE  Sensation intact throughout all major nerve distributions  Skin: Skin is warm and dry.  Diffuse skin breakdown noted to the lateral aspect of the left lower extremity with some surrounding warmth and erythema.  No fluctuance noted.  Bilateral lower extremities are slightly cool to the touch to the distal aspects of the bilateral feet.  Right distal toes are slightly dusky in appearance and have delayed cap refill.  Psychiatric: He has a normal mood and affect. His speech is normal.  Nursing note and vitals reviewed.    ED Treatments / Results  Labs (all labs ordered are listed, but only abnormal results are displayed) Labs Reviewed  CBC WITH DIFFERENTIAL/PLATELET - Abnormal; Notable for the following components:        Result Value   WBC 25.7 (*)    RBC 3.86 (*)    Hemoglobin 11.0 (*)    HCT 33.4 (*)    Neutro Abs 22.1 (*)    All other components within normal limits  URINALYSIS, ROUTINE W REFLEX MICROSCOPIC - Abnormal; Notable for the following components:   Hgb urine dipstick LARGE (*)    Ketones, ur 5 (*)    Nitrite POSITIVE (*)    Leukocytes, UA SMALL (*)    Bacteria, UA MANY (*)    All other components within normal limits  COMPREHENSIVE METABOLIC PANEL - Abnormal; Notable for the following components:   Potassium 5.2 (*)    CO2 19 (*)    BUN 56 (*)    Creatinine, Ser 1.56 (*)    Calcium 7.5 (*)    Albumin 2.0 (*)    AST 164 (*)    ALT 88 (*)    Total Bilirubin 1.6 (*)    GFR calc non Af Amer 54 (*)    All other components within normal limits  CK TOTAL AND CKMB (NOT AT [facility]) - Abnormal; Notable for the following components:   Total CK 6,022 (*)    CK, MB 50.2 (*)    All other components within normal limits  D-DIMER, QUANTITATIVE (NOT AT [facility]) -  Abnormal; Notable for the following components:   D-Dimer, Quant 7.69 (*)    All other components within normal limits  C DIFFICILE QUICK SCREEN W PCR REFLEX  CULTURE, BLOOD (ROUTINE X 2)  CULTURE, BLOOD (ROUTINE X 2)  GASTROINTESTINAL PANEL BY PCR, STOOL (REPLACES STOOL CULTURE)  MRSA PCR SCREENING  ETHANOL  RAPID URINE DRUG SCREEN, HOSP PERFORMED  LIPASE, BLOOD  PROCALCITONIN  PHOSPHORUS  MAGNESIUM  HIV ANTIBODY (ROUTINE TESTING)  CBC  COMPREHENSIVE METABOLIC PANEL  PREALBUMIN  HEPATITIS PANEL, ACUTE  I-STAT CG4 LACTIC ACID, ED  I-STAT TROPONIN, ED    EKG  EKG Interpretation  Date/Time:  Thursday December 04 2017 15:19:13 EST Ventricular Rate:  143 PR Interval:    QRS Duration: 78 QT Interval:  275 QTC Calculation: 425 R Axis:   76 Text Interpretation:  Sinus tachycardia Ventricular premature complex Consider right atrial enlargement Artifact in lead(s) I III aVR aVL V1 V2 Since previous tracing rate faster Confirmed by Linker, Martha ([id number]) on 12/04/2017 4:17:11 PM       Radiology Dg Chest 2 View  Result Date: 12/04/2017 CLINICAL DATA:  Shortness of breath. EXAM: CHEST  2 VIEW COMPARISON:  08/04/2008. FINDINGS: Mediastinum hilar structures are normal. Heart size normal. No focal infiltrate. No pleural effusion or pneumothorax. Thoracic spine scoliosis. IMPRESSION: No acute cardiopulmonary disease. Electronically Signed   By: Thomas  Register   On: 12/04/2017 16:05   Dg Chest Port 1 View  Result Date: 12/04/2017 CLINICAL DATA:  41-year-old male with weight loss, diarrhea, and sepsis. EXAM: PORTABLE CHEST 1 VIEW COMPARISON:  Chest radiograph dated 12/04/2017 FINDINGS: The lungs are clear. There is no pleural effusion pneumothorax. The cardiac silhouette is within normal limits. No acute osseous pathology. IMPRESSION: No active disease. Electronically Signed   By: Arash  Radparvar M.D.   On: 12/04/2017 21:15   Dg Foot Complete Left  Result Date: 12/04/2017 CLINICAL  DATA:  Foot pain.  Information. EXAM: LEFT FOOT - COMPLETE 3+ VIEW COMPARISON:  None. FINDINGS: No acute bony abnormality identified. No evidence of fracture dislocation. Mild degenerative changes first MTP joint. No evidence of inflammatory arthropathy. No radiopaque foreign body. IMPRESSION: Mild degenerative changes first   MTP joint. No inflammatory arthropathy. No acute bony abnormality. Electronically Signed   By: Thomas  Register   On: 12/04/2017 16:08   Dg Foot Complete Right  Result Date: 12/04/2017 CLINICAL DATA:  Foot pain with inflammation EXAM: RIGHT FOOT COMPLETE - 3+ VIEW COMPARISON:  Left foot radiographs 12/04/2017 FINDINGS: No fracture or malalignment. No definite periostitis or bone destruction. No radiopaque foreign body or soft tissue gas. IMPRESSION: No definite acute osseous abnormality Electronically Signed   By: Kim  Fujinaga M.D.   On: 12/04/2017 16:07    Procedures Procedures (including critical care time)  Medications Ordered in ED Medications  budesonide (ENTOCORT EC) 24 hr capsule 9 mg (not administered)  enoxaparin (LOVENOX) injection 40 mg (40 mg Subcutaneous Given 12/04/17 2316)  0.9 %  sodium chloride infusion ( Intravenous New Bag/Given 12/04/17 2005)  ondansetron (ZOFRAN) tablet 4 mg (not administered)    Or  ondansetron (ZOFRAN) injection 4 mg (not administered)  acetaminophen (TYLENOL) tablet 650 mg (not administered)    Or  acetaminophen (TYLENOL) suppository 650 mg (not administered)  ipratropium-albuterol (DUONEB) 0.5-2.5 (3) MG/3ML nebulizer solution 3 mL (not administered)  nicotine (NICODERM CQ - dosed in mg/24 hours) patch 21 mg (21 mg Transdermal Patch Applied 12/04/17 2317)  vancomycin (VANCOCIN) IVPB 750 mg/150 ml premix (not administered)  piperacillin-tazobactam (ZOSYN) IVPB 3.375 g (not administered)  benztropine (COGENTIN) tablet 0.5 mg (0.5 mg Oral Given 12/04/17 2317)  sodium chloride 0.9 % bolus 1,000 mL (0 mLs Intravenous Stopped 12/04/17  1902)  sodium chloride 0.9 % bolus 1,000 mL (0 mLs Intravenous Stopped 12/04/17 1902)  vancomycin (VANCOCIN) IVPB 1000 mg/200 mL premix (0 mg Intravenous Stopped 12/04/17 2002)  piperacillin-tazobactam (ZOSYN) IVPB 3.375 g (0 g Intravenous Stopped 12/04/17 1902)  acetaminophen (TYLENOL) tablet 1,000 mg (1,000 mg Oral Given 12/04/17 1902)  sodium chloride 0.9 % bolus 1,000 mL (1,000 mLs Intravenous New Bag/Given 12/04/17 2114)     Initial Impression / Assessment and Plan / ED Course  I have reviewed the triage vital signs and the nursing notes.  Pertinent labs & imaging results that were available during my care of the patient were reviewed by me and considered in my medical decision making (see chart for details).     41 y.o. M past medical history of schizophrenia, anxiety, ulcerative colitis who presents for evaluation for chronic diarrhea and shortness of breath.  Pop patient reports he has a long standing history of diarrhea and has been followed by the [facility] for the symptoms.  He states that diarrhea is continued to persist and is having several loose episodes of stool a day.  Additionally, patient reports for the last 3 days, he has had shortness of breath.  Patient reports that he will start having hiccuping/shortness of breath sensation and feels like he cannot breathe.  No alleviating or aggravating factors.  No fever, abdominal pain, chest pain, blood in stool.  Patient is afebrile, but is tachycardic.  Of note, patient does have a history of baseline tachycardia.  Normal blood pressures. No indication for sepsis at this time. Will repeat temp. On exam, patient is very frail and thin appearing.  He is constantly shivering.  Lungs clear to auscultation.  No abdominal tenderness.  He does have diffuse area of skin breakdown noted to the lateral aspect of his left lower extremity.  Bilateral lower extremities are slightly dusky in appearance and cool to touch but good Doppler DP pulses  bilaterally.  Based on history/physical exam, does not sound like   patient is incontinent of urine. Do not suspect cauda equina. Consider infectious etiology vs ACS etiology vs C.Diff vs rhabodo. History/physical exam is not concerning for GI bleed.  Will plan to check basic labs, CXR, XR bilateral feet for eval of subcutaneous gas.   CMP shows bicarb is 19, bump in creatinine and BUN 56 and creatinine is 1.75.  Bump in AST/ALT which is new.  Alk phos is unremarkable.  Total bili is slightly elevated.  Potassium is 5.2.  CBC shows leukocytosis of 25.7.  Hemoglobin hematocrit are stable at 11.0 and 33.4.  Patient became hypotensive and febrile.  Code sepsis initiated.  Patient has received 2 L of fluid.  We will add additional fluids.  Lactic acid was 1.77.  Unclear source at this time.  Broad-spectrum antibiotics ordered by supervising attending.  Chest x-ray negative for any acute abnormalities.  Foot x-rays of bilateral feet shows no subcutaneous emphysema that would be concerning for osteomyelitis.  UA shows positive nitrates, leukocytes, pyuria.  The source of infection.  Additionally, patient also has an area of skin breakdown noted in the lateral of his left lower extremity is concerning for cellulitis.  This could be source of infection.  Will plan for admission. Plan to consult vascular regarding dusky toes.   Discussed patient with Dr. Smith (hospitalist). Will admit.    Final Clinical Impressions(s) / ED Diagnoses   Final diagnoses:  Chronic diarrhea  Acute cystitis with hematuria  Acute kidney injury (HCC)    ED Discharge Orders    None       ,  A, PA-C 12/05/17 0037    Mabe, Martha L, MD 12/05/17 0056  

## 2017-12-04 NOTE — Progress Notes (Signed)
Pharmacy Antibiotic Note  Christian Lamb is a 42 y.o. male presented to the ED on 12/04/2017 with large areas of broken skin on hip and buttocks and c/o diarrhea and SOB.  To start broad abx with vancomycin and zosyn for suspected sepsis.  -  scr 1.56 (crcl ~47, est weight 54 kg) - wbc 25.7   Plan: - vancomycin 1000 mg IV q24h, then 750 mg IV q24h for est AUC 486 (goal 400-500) - zosyn 3.375 gm IV q8h (infuse over 4 hrs) - daily scr  _______________________________  Temp (24hrs), Avg:99.1 F (37.3 C), Min:97.5 F (36.4 C), Max:102.3 F (39.1 C)  Recent Labs  Lab 12/04/17 1629 12/04/17 1638 12/04/17 1820  WBC 25.7*  --   --   CREATININE  --   --  1.56*  LATICACIDVEN  --  1.77  --     CrCl cannot be calculated (Unknown ideal weight.).    No Known Allergies  Thank you for allowing pharmacy to be a part of this patient's care.  Lynelle Doctor 12/04/2017 9:05 PM

## 2017-12-04 NOTE — H&P (Signed)
History and Physical    Moustapha Tooker ASN:[id number] DOB: 12/13/1975 DOA: 12/04/2017  Referring MD/NP/PA:Lindsey Etheleen Nicks PCP: [name]  Patient coming from: Via EMS  Chief Complaint: Diarrhea  I have personally briefly reviewed patient's old medical records in [place]   HPI: Christian Lamb is a 42 y.o. 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  He makes note that the diarrhea symptoms have been ongoing over the last several months for which he has been under the care of Dr. Havery Moros of [facility].  However, however was last seen on 07/14/2017 for pouchoscopy and had been started on budesonide advised to follow-up, but has missed his recent appointments.  He reports having 6 or more loose mostly nonbloody diarrhea stools per day.  He tries to keep himself hydrated, but reports having to drink excessive amounts of water to try and keep up.  Everything that he eats runs right through him.  He makes note that he lives in a "junk house" where water and food is unsanitary.  Due to the symptoms patient reports lying in bed for extended periods of time as he is unable to always make it to the bathroom in time.  Over the last 2 days he reports having more shortness of breath with intermittent wheezing.  Associated complaints of chills, intermittent abdominal pain with bowel movements, intermittent blood in his stool( does not recall the last time this is occurred), dysuria, generalized weakness, loss of feeling in his feet, and bilateral foot swelling.  Denies any chest pain, cough, change in vision, or focal weakness.  His caseworker come to see him today and had called EMS.  En route with EMS patient was given 10 mg of  albuterol, 0.5 mg of Atrovent, 1500 mL of normal saline bolus.  ED Course: Upon admission into the emergency room patient was noted to be febrile up to 102.3 F, heart rates 140-152, respirations 11-20, blood pressures 90/71 to 115/75, and O2 saturations maintained on room air.  Labs revealed WBC 25.7, hemoglobin 11 potassium 5.2, BUN 56, creatinine 1.6, calcium 7.5, lactic acid 1.77, AST 164, ALT 88, total bilirubin 1.6.  Urine drug screen and alcohol level were noted to be unremarkable.  Chest x-ray negative for any acute abnormalities.  Urinalysis was positive for many bacteria, nitrites, and leukocyte esterase.  Sepsis protocol had been initiated with patient being given 2 L of normal saline IV fluids with empiric antibiotics of vancomycin and Zosyn.  CPK level was pending as well as consult to vascular surgery due to discoloration of the lower extremities.  Review of Systems  Constitutional: Positive for chills and weight loss.  HENT: Negative for nosebleeds and sore throat.   Eyes: Negative for pain and discharge.  Respiratory: Positive for shortness of breath and wheezing. Negative for hemoptysis.   Cardiovascular: Positive for leg swelling. Negative for chest pain.  Gastrointestinal: Positive for abdominal pain and diarrhea.  Genitourinary: Positive for dysuria. Negative for flank pain.  Musculoskeletal: Positive for myalgias. Negative for falls.  Skin: Positive for rash.  Neurological: Positive for sensory change. Negative for focal weakness.  Endo/Heme/Allergies: Positive for polydipsia.  Psychiatric/Behavioral: Negative for substance abuse and suicidal ideas.    Past Medical History:  Diagnosis Date  . Anemia   . Anxiety   . Schizophrenia ([place])   .  Ulcerative colitis [facility])     Past Surgical History:  Procedure Laterality Date  . SMALL INTESTINE SURGERY  2016   in [place]  . TOTAL COLECTOMY  2003     reports that he has been smoking cigarettes and cigars.  He has been  smoking about 0.25 packs per day. he has never used smokeless tobacco. He reports that he does not drink alcohol or use drugs.  No Known Allergies  Family History  Problem Relation Age of Onset  . Colon cancer Father 66  . Stomach cancer Neg Hx     Prior to Admission medications   Medication Sig Start Date End Date Taking? Authorizing Provider  B Complex Vitamins (B COMPLEX PO) Take 1 capsule by mouth daily.    [provider]  benztropine (COGENTIN) 0.5 MG tablet Take 0.5 mg by mouth Nightly.    [provider]  budesonide (ENTOCORT EC) 3 MG 24 hr capsule Take 3 capsules (9 mg total) by mouth as directed. 11/19/17   Armbruster, Carlota Raspberry, MD  paliperidone (INVEGA SUSTENNA) 156 MG/ML SUSP injection Inject 1 mL (156 mg total) into the muscle once. 02/20/16   Hildred Priest, MD  paliperidone (INVEGA) 6 MG 24 hr tablet Take 2 tablets (12 mg total) by mouth at bedtime. 02/15/16   Hildred Priest, MD    Physical Exam:  Constitutional: Cachectic appearing male who appears acutely ill Vitals:   12/04/17 1511 12/04/17 1614 12/04/17 1722 12/04/17 1828  BP:  (!) 98/53 114/61 96/62  Pulse:  (!) 152 (!) 140 (!) 145  Resp:  20 14 14   Temp:  (!) 97.5 F (36.4 C)  (!) 102.3 F (39.1 C)  TempSrc:  Oral  Oral  SpO2: 99% 100% 98% 98%   Eyes: PERRL, lids and conjunctivae normal ENMT: Mucous membranes are dry. Posterior pharynx clear of any exudate or lesions. Poor dentition.  Neck: normal, supple, no masses, no thyromegaly Respiratory: clear to auscultation bilaterally, no wheezing, no crackles. Normal respiratory effort. No accessory muscle use.  Cardiovascular: Tachycardic, no murmurs / rubs / gallops.  1+ extremity edema noted on the feet and ankles. 2+ pedal pulses. No carotid bruits.  Abdomen: no tenderness, no masses palpated. No hepatosplenomegaly. Bowel sounds positive.  Musculoskeletal: Muscle atrophy noted of the bilateral lower extremities. Skin:  Large areas of skin breakdown of the hips and buttocks and discoloration of the right distal toes. Neurologic: CN 2-12 grossly intact.  Sensation abnormal.  Strength approximately 4 out of 5 in all 4 extremities. Psychiatric: Normal judgment and insight. Alert and oriented x 3. Normal mood.     Labs on Admission: I have personally reviewed following labs and imaging studies  CBC: Recent Labs  Lab 12/04/17 1629  WBC 25.7*  NEUTROABS 22.1*  HGB 11.0*  HCT 33.4*  MCV 86.5  PLT 174   Basic Metabolic Panel: Recent Labs  Lab 12/04/17 1820  NA 140  K 5.2*  CL 110  CO2 19*  GLUCOSE 90  BUN 56*  CREATININE 1.56*  CALCIUM 7.5*   GFR: CrCl cannot be calculated (Unknown ideal weight.). Liver Function Tests: Recent Labs  Lab 12/04/17 1820  AST 164*  ALT 88*  ALKPHOS 84  BILITOT 1.6*  PROT 7.4  ALBUMIN 2.0*   Recent Labs  Lab 12/04/17 1820  LIPASE 21   No results for input(s): AMMONIA in the last 168 hours. Coagulation Profile: No results for input(s): INR, PROTIME in the last 168 hours. Cardiac Enzymes: No results for input(s):  CKTOTAL, CKMB, CKMBINDEX, TROPONINI in the last 168 hours. BNP (last 3 results) No results for input(s): PROBNP in the last 8760 hours. HbA1C: No results for input(s): HGBA1C in the last 72 hours. CBG: No results for input(s): GLUCAP in the last 168 hours. Lipid Profile: No results for input(s): CHOL, HDL, LDLCALC, TRIG, CHOLHDL, LDLDIRECT in the last 72 hours. Thyroid Function Tests: No results for input(s): TSH, T4TOTAL, FREET4, T3FREE, THYROIDAB in the last 72 hours. Anemia Panel: No results for input(s): VITAMINB12, FOLATE, FERRITIN, TIBC, IRON, RETICCTPCT in the last 72 hours. Urine analysis:    Component Value Date/Time   COLORURINE YELLOW 12/04/2017 1813   APPEARANCEUR CLEAR 12/04/2017 1813   LABSPEC 1.011 12/04/2017 1813   PHURINE 5.0 12/04/2017 1813   GLUCOSEU NEGATIVE 12/04/2017 1813   HGBUR LARGE (A) 12/04/2017 1813    BILIRUBINUR NEGATIVE 12/04/2017 1813   KETONESUR 5 (A) 12/04/2017 1813   PROTEINUR NEGATIVE 12/04/2017 1813   NITRITE POSITIVE (A) 12/04/2017 1813   LEUKOCYTESUR SMALL (A) 12/04/2017 1813   Sepsis Labs: No results found for this or any previous visit (from the past 240 hour(s)).   Radiological Exams on Admission: Dg Chest 2 View  Result Date: 12/04/2017 CLINICAL DATA:  Shortness of breath. EXAM: CHEST  2 VIEW COMPARISON:  08/04/2008. FINDINGS: Mediastinum hilar structures are normal. Heart size normal. No focal infiltrate. No pleural effusion or pneumothorax. Thoracic spine scoliosis. IMPRESSION: No acute cardiopulmonary disease. Electronically Signed   By: Marcello Moores  Register   On: 12/04/2017 16:05   Dg Foot Complete Left  Result Date: 12/04/2017 CLINICAL DATA:  Foot pain.  Information. EXAM: LEFT FOOT - COMPLETE 3+ VIEW COMPARISON:  None. FINDINGS: No acute bony abnormality identified. No evidence of fracture dislocation. Mild degenerative changes first MTP joint. No evidence of inflammatory arthropathy. No radiopaque foreign body. IMPRESSION: Mild degenerative changes first MTP joint. No inflammatory arthropathy. No acute bony abnormality. Electronically Signed   By: Marcello Moores  Register   On: 12/04/2017 16:08   Dg Foot Complete Right  Result Date: 12/04/2017 CLINICAL DATA:  Foot pain with inflammation EXAM: RIGHT FOOT COMPLETE - 3+ VIEW COMPARISON:  Left foot radiographs 12/04/2017 FINDINGS: No fracture or malalignment. No definite periostitis or bone destruction. No radiopaque foreign body or soft tissue gas. IMPRESSION: No definite acute osseous abnormality Electronically Signed   By: Donavan Foil M.D.   On: 12/04/2017 16:07    EKG: Independently reviewed.  Sinus tachycardia 143 bpm with some PVCs noted.  Assessment/Plan Sepsis secondary to urinary tract infection: Acute.  Patient presents febrile up to 102.3 F with WBC 25.7.  Lactic acid was reassuring at 1.77, but patient had received IV  fluids prior to arrival with EMS.  Urinalysis positive for nitrites, leukocytes, and many bacteria.  Patient had been empirically started on vancomycin and Zosyn.  UTI is most likely source, but also question possibility of cellulitis and/or GI issue.  - Admit to a stepdown bed - Follow-up blood and urine cultures  - Continue empiric antibiotics of vancomycin and Zosyn - Tylenol prn fever - Recheck CBC in a.m.  Acute kidney injury, rhabdomyolysis: Patient's baseline creatinine appears to be around 0.9-1, but presents with a creatinine of 1.56 with BUN 56.  Suspect prerenal cause of symptoms given diarrhea and elevated BUN to creatinine ratio.  CK came back elevated at 6022 given concern for rhabdomyolysis.. - Bolus additional 1 L of normal saline IV fluids, continue IV fluids as tolerated. - Recheck creatinine in a.m. - Check daily CK  Ulcerative colitis with diarrhea: Acute on chronic.  Patient reports having up to 6 nonbloody bowel movements per.  Sees Dr. Havery Moros of GI.  Currently on budesonide last treated with [place] reportedly 1 month ago. - Follow-up GI panel and C. Difficile - Continue budesonide - Place rectal tube - Will need to consult Dr. Havery Moros GI in a.m.  Dyspnea: Patient reports feeling short of breath, but chest x-ray otherwise noted to be clear. - Follow-up pro calcitonin - May warrant repeat repeat chest x-ray in a.m.  Anemia of chronic disease: Hemoglobin stable at 11. - Recheck CBC in a.m.  Hyperkalemia: Acute.  Probably elevated at 5.2 on admission.  IV fluids given. - Recheck potassium in a.m.  Lower extremity edema and discoloration of right toes - Check d-dimer - Check Doppler vascular ultrasound - Follow-up vascular surgery recommendations  Elevated liver enzymes: Likely related to rhabdo. - Check hepatitis panel - Recheck CMP in a.m.  Weakness - Physical therapy therapy to eval and  Skin breakdown of left hip - Wound care consult     Schizophrenia/schizoaffective disorder - Continue Cogentin   Tobacco abuse: Patient reports smoking 1 pack cigarettes per day on average - Nicotine patch offered  DVT prophylaxis:  Lovenox   Code Status: Full  Family Communication: No family present at bedside Disposition Plan: TBD Consults called: Vascular surgery Admission status: Inpatient   Norval Morton MD [facility] Pager [phone]   If 7PM-7AM, please contact night-coverage [email or web address] Password [facility]  12/04/2017, 7:32 PM

## 2017-12-04 NOTE — ED Triage Notes (Signed)
Per EMS pt complaint of diarrhea and SOB. Pt given 10 mg Albuterol, 0.5 mg Atrovent, and 1500 ml normal saline bolus with EMS.

## 2017-12-04 NOTE — ED Notes (Signed)
ED TO INPATIENT HANDOFF REPORT  Name/Age/Gender Christian Lamb 42 y.o. male  Code Status    Code Status Orders  (From admission, onward)        Start     Ordered   12/04/17 2052  Full code  Continuous     12/04/17 2058    Code Status History    Date Active Date Inactive Code Status Order ID Comments User Context   02/11/2016 20:56 02/16/2016 16:09 Full Code [id number]  Clovis Fredrickson, MD Inpatient      Home/SNF/Other Home  Chief Complaint Shortness of Breath; Diarrhea  Level of Care/Admitting Diagnosis ED Disposition    ED Disposition Condition [place]: [facility] [[id number]]  Level of Care: Stepdown [14]  Admit to SDU based on following criteria: Hemodynamic compromise or significant risk of instability:  Patient requiring short term acute titration and management of vasoactive drips, and invasive monitoring (i.e., CVP and Arterial line).  Admit to SDU based on following criteria: Cardiac Instability:  Patients experiencing chest pain, unconfirmed MI and stable, arrhythmias and CHF requiring medical management and potentially compromising patient's stability  Diagnosis: Sepsis [facility]) [[id number]]  Admitting Physician: Norval Morton [[id number]]  Attending Physician: Norval Morton [[id number]]  Estimated length of stay: 3 - 4 days  Certification:: I certify this patient will need inpatient services for at least 2 midnights  PT Class (Do Not Modify): Inpatient [101]  PT Acc Code (Do Not Modify): Private [1]       Medical History Past Medical History:  Diagnosis Date  . Anemia   . Anxiety   . Schizophrenia ([place])   . Ulcerative colitis ([place])     Allergies No Known Allergies  IV Location/Drains/Wounds Patient Lines/Drains/Airways Status   Active Line/Drains/Airways    Name:   Placement date:   Placement time:   Site:   Days:   Peripheral IV 12/04/17 Left;Upper Arm   12/04/17    1512    Arm   less than 1           Labs/Imaging Results for orders placed or performed during the hospital encounter of 12/04/17 (from the past 48 hour(s))  CBC with Differential     Status: Abnormal   Collection Time: 12/04/17  4:29 PM  Result Value Ref Range   WBC 25.7 (H) 4.0 - 10.5 K/uL   RBC 3.86 (L) 4.22 - 5.81 MIL/uL   Hemoglobin 11.0 (L) 13.0 - 17.0 g/dL   HCT 33.4 (L) 39.0 - 52.0 %   MCV 86.5 78.0 - 100.0 fL   MCH 28.5 26.0 - 34.0 pg   MCHC 32.9 30.0 - 36.0 g/dL   RDW 14.2 11.5 - 15.5 %   Platelets 354 150 - 400 K/uL   Neutrophils Relative % 86 %   Lymphocytes Relative 10 %   Monocytes Relative 4 %   Eosinophils Relative 0 %   Basophils Relative 0 %   Neutro Abs 22.1 (H) 1.7 - 7.7 K/uL   Lymphs Abs 2.6 0.7 - 4.0 K/uL   Monocytes Absolute 1.0 0.1 - 1.0 K/uL   Eosinophils Absolute 0.0 0.0 - 0.7 K/uL   Basophils Absolute 0.0 0.0 - 0.1 K/uL   WBC Morphology MILD LEFT SHIFT (1-5% METAS, OCC MYELO, OCC BANDS)     Comment: Performed at [facility], [place] [address] [place], [place] [address]  Ethanol     Status: None   Collection Time: 12/04/17  4:30 PM  Result Value Ref Range   Alcohol, Ethyl (B) <10 <10 mg/dL    Comment:        LOWEST DETECTABLE LIMIT FOR SERUM ALCOHOL IS 10 mg/dL FOR MEDICAL PURPOSES ONLY Performed at [place] [address] [place], [place] [address]   I-Stat Troponin, ED (not at [facility])     Status: None   Collection Time: 12/04/17  4:36 PM  Result Value Ref Range   Troponin i, poc 0.00 0.00 - 0.08 ng/mL   Comment 3            Comment: Due to the release kinetics of cTnI, a negative result within the first hours of the onset of symptoms does not rule out myocardial infarction with certainty. If myocardial infarction is still suspected, repeat the test at appropriate intervals.   I-Stat CG4 Lactic Acid, ED     Status: None   Collection Time: 12/04/17  4:38 PM  Result Value Ref Range   Lactic Acid, Venous 1.77 0.5 - 1.9 mmol/L   Urinalysis, Routine w reflex microscopic     Status: Abnormal   Collection Time: 12/04/17  6:13 PM  Result Value Ref Range   Color, Urine YELLOW YELLOW   APPearance CLEAR CLEAR   Specific Gravity, Urine 1.011 1.005 - 1.030   pH 5.0 5.0 - 8.0   Glucose, UA NEGATIVE NEGATIVE mg/dL   Hgb urine dipstick LARGE (A) NEGATIVE   Bilirubin Urine NEGATIVE NEGATIVE   Ketones, ur 5 (A) NEGATIVE mg/dL   Protein, ur NEGATIVE NEGATIVE mg/dL   Nitrite POSITIVE (A) NEGATIVE   Leukocytes, UA SMALL (A) NEGATIVE   RBC / HPF TOO NUMEROUS TO COUNT 0 - 5 RBC/hpf   WBC, UA 6-30 0 - 5 WBC/hpf   Bacteria, UA MANY (A) NONE SEEN   Squamous Epithelial / LPF NONE SEEN NONE SEEN    Comment: Performed at [facility], [place] [address] [place], [place] [address]  Rapid urine drug screen (hospital performed)     Status: None   Collection Time: 12/04/17  6:13 PM  Result Value Ref Range   Opiates NONE DETECTED NONE DETECTED   Cocaine NONE DETECTED NONE DETECTED   Benzodiazepines NONE DETECTED NONE DETECTED   Amphetamines NONE DETECTED NONE DETECTED   Tetrahydrocannabinol NONE DETECTED NONE DETECTED   Barbiturates NONE DETECTED NONE DETECTED    Comment: (NOTE) DRUG SCREEN FOR MEDICAL PURPOSES ONLY.  IF CONFIRMATION IS NEEDED FOR ANY PURPOSE, NOTIFY LAB WITHIN 5 DAYS. LOWEST DETECTABLE LIMITS FOR URINE DRUG SCREEN Drug Class                     Cutoff (ng/mL) Amphetamine and metabolites    1000 Barbiturate and metabolites    200 Benzodiazepine                 254 Tricyclics and metabolites     300 Opiates and metabolites        300 Cocaine and metabolites        300 THC                            50 Performed at [facility], [place] [address], [place], [place] [address]   Comprehensive metabolic panel     Status: Abnormal   Collection Time: 12/04/17  6:20 PM  Result Value Ref Range   Sodium 140 135 - 145 mmol/L   Potassium 5.2 (H)  3.5 - 5.1 mmol/L   Chloride 110 101 -  111 mmol/L   CO2 19 (L) 22 - 32 mmol/L   Glucose, Bld 90 65 - 99 mg/dL   BUN 56 (H) 6 - 20 mg/dL   Creatinine, Ser 1.56 (H) 0.61 - 1.24 mg/dL   Calcium 7.5 (L) 8.9 - 10.3 mg/dL   Total Protein 7.4 6.5 - 8.1 g/dL   Albumin 2.0 (L) 3.5 - 5.0 g/dL   AST 164 (H) 15 - 41 U/L   ALT 88 (H) 17 - 63 U/L   Alkaline Phosphatase 84 38 - 126 U/L   Total Bilirubin 1.6 (H) 0.3 - 1.2 mg/dL   GFR calc non Af Amer 54 (L) >60 mL/min   GFR calc Af Amer >60 >60 mL/min    Comment: (NOTE) The eGFR has been calculated using the CKD EPI equation. This calculation has not been validated in all clinical situations. eGFR's persistently <60 mL/min signify possible Chronic Kidney Disease.    Anion gap 11 5 - 15    Comment: Performed at [facility], [place] [address] [place], [place] [address]  Lipase, blood     Status: None   Collection Time: 12/04/17  6:20 PM  Result Value Ref Range   Lipase 21 11 - 51 U/L    Comment: Performed at [facility], [place] [address], [place], [place] [address]  D-dimer, quantitative (not at [facility])     Status: Abnormal   Collection Time: 12/04/17  6:20 PM  Result Value Ref Range   D-Dimer, Quant 7.69 (H) 0.00 - 0.50 ug/mL-FEU    Comment: (NOTE) At the manufacturer cut-off of 0.50 ug/mL FEU, this assay has been documented to exclude PE with a sensitivity and negative predictive value of 97 to 99%.  At this time, this assay has not been approved by the FDA to exclude DVT/VTE. Results should be correlated with clinical presentation. Performed at [facility], [place] [address], [place], [place] [address]   C difficile quick scan w PCR reflex     Status: None   Collection Time: 12/04/17  7:16 PM  Result Value Ref Range   C Diff antigen NEGATIVE NEGATIVE   C Diff toxin NEGATIVE NEGATIVE   C Diff interpretation No C. difficile detected.     Comment: Performed at [facility], [place] [address], [place],  [place] [address]   Dg Chest 2 View  Result Date: 12/04/2017 CLINICAL DATA:  Shortness of breath. EXAM: CHEST  2 VIEW COMPARISON:  08/04/2008. FINDINGS: Mediastinum hilar structures are normal. Heart size normal. No focal infiltrate. No pleural effusion or pneumothorax. Thoracic spine scoliosis. IMPRESSION: No acute cardiopulmonary disease. Electronically Signed   By: Marcello Moores  Register   On: 12/04/2017 16:05   Dg Foot Complete Left  Result Date: 12/04/2017 CLINICAL DATA:  Foot pain.  Information. EXAM: LEFT FOOT - COMPLETE 3+ VIEW COMPARISON:  None. FINDINGS: No acute bony abnormality identified. No evidence of fracture dislocation. Mild degenerative changes first MTP joint. No evidence of inflammatory arthropathy. No radiopaque foreign body. IMPRESSION: Mild degenerative changes first MTP joint. No inflammatory arthropathy. No acute bony abnormality. Electronically Signed   By: Marcello Moores  Register   On: 12/04/2017 16:08   Dg Foot Complete Right  Result Date: 12/04/2017 CLINICAL DATA:  Foot pain with inflammation EXAM: RIGHT FOOT COMPLETE - 3+ VIEW COMPARISON:  Left foot radiographs 12/04/2017 FINDINGS: No fracture or malalignment. No definite periostitis or bone destruction. No radiopaque foreign  body or soft tissue gas. IMPRESSION: No definite acute osseous abnormality Electronically Signed   By: Donavan Foil M.D.   On: 12/04/2017 16:07    Pending Labs Unresulted Labs (From admission, onward)   Start     Ordered   12/05/17 0500  HIV antibody (Routine Testing)  Tomorrow morning,   R     12/04/17 2058   12/05/17 0500  CBC  Tomorrow morning,   R     12/04/17 2058   12/05/17 0500  Comprehensive metabolic panel  Tomorrow morning,   R     12/04/17 2058   12/05/17 0500  Prealbumin  Tomorrow morning,   R     12/04/17 2058   12/04/17 2101  Hepatitis panel, acute  Add-on,   R     12/04/17 2100   12/04/17 2054  Phosphorus  Add-on,   R     12/04/17 2058   12/04/17 2054  Magnesium  Once,   R     12/04/17  2058   12/04/17 2051  Procalcitonin  STAT,   R     12/04/17 2058   12/04/17 1935  CK total and CKMB (cardiac)not at [facility]  Once,   R     12/04/17 1935   12/04/17 1735  Gastrointestinal Panel by PCR , Stool  (Gastrointestinal Panel by PCR, Stool)  Once,   R     12/04/17 1734   12/04/17 1717  Blood Culture (routine x 2)  BLOOD CULTURE X 2,   STAT     12/04/17 1716      Vitals/Pain Today's Vitals   12/04/17 2026 12/04/17 2102 12/04/17 2107 12/04/17 2111  BP: 98/62   (!) 104/59  Pulse: (!) 143   (!) 140  Resp: 12   15  Temp:      TempSrc:      SpO2: 98%   (!) 1%  Weight:   120 lb (54.4 kg)   PainSc:  Asleep      Isolation Precautions Enteric precautions (UV disinfection)  Medications Medications  benztropine (COGENTIN) tablet 0.5 mg (not administered)  budesonide (ENTOCORT EC) 24 hr capsule 9 mg (not administered)  enoxaparin (LOVENOX) injection 40 mg (not administered)  0.9 %  sodium chloride infusion ( Intravenous New Bag/Given 12/04/17 2005)  ondansetron (ZOFRAN) tablet 4 mg (not administered)    Or  ondansetron (ZOFRAN) injection 4 mg (not administered)  acetaminophen (TYLENOL) tablet 650 mg (not administered)    Or  acetaminophen (TYLENOL) suppository 650 mg (not administered)  ipratropium-albuterol (DUONEB) 0.5-2.5 (3) MG/3ML nebulizer solution 3 mL (not administered)  nicotine (NICODERM CQ - dosed in mg/24 hours) patch 21 mg (not administered)  sodium chloride 0.9 % bolus 1,000 mL (0 mLs Intravenous Stopped 12/04/17 1902)  sodium chloride 0.9 % bolus 1,000 mL (0 mLs Intravenous Stopped 12/04/17 1902)  vancomycin (VANCOCIN) IVPB 1000 mg/200 mL premix (0 mg Intravenous Stopped 12/04/17 2002)  piperacillin-tazobactam (ZOSYN) IVPB 3.375 g (0 g Intravenous Stopped 12/04/17 1902)  acetaminophen (TYLENOL) tablet 1,000 mg (1,000 mg Oral Given 12/04/17 1902)  sodium chloride 0.9 % bolus 1,000 mL (1,000 mLs Intravenous New Bag/Given 12/04/17 2114)    Mobility non-ambulatory

## 2017-12-05 ENCOUNTER — Inpatient Hospital Stay (HOSPITAL_COMMUNITY): Payer: Medicaid Other

## 2017-12-05 DIAGNOSIS — R634 Abnormal weight loss: Secondary | ICD-10-CM

## 2017-12-05 DIAGNOSIS — R531 Weakness: Secondary | ICD-10-CM

## 2017-12-05 DIAGNOSIS — N179 Acute kidney failure, unspecified: Secondary | ICD-10-CM

## 2017-12-05 DIAGNOSIS — M6282 Rhabdomyolysis: Secondary | ICD-10-CM | POA: Diagnosis present

## 2017-12-05 DIAGNOSIS — D638 Anemia in other chronic diseases classified elsewhere: Secondary | ICD-10-CM

## 2017-12-05 DIAGNOSIS — E43 Unspecified severe protein-calorie malnutrition: Secondary | ICD-10-CM

## 2017-12-05 DIAGNOSIS — R748 Abnormal levels of other serum enzymes: Secondary | ICD-10-CM | POA: Diagnosis present

## 2017-12-05 DIAGNOSIS — R197 Diarrhea, unspecified: Secondary | ICD-10-CM

## 2017-12-05 DIAGNOSIS — R609 Edema, unspecified: Secondary | ICD-10-CM

## 2017-12-05 DIAGNOSIS — K51018 Ulcerative (chronic) pancolitis with other complication: Secondary | ICD-10-CM

## 2017-12-05 DIAGNOSIS — A419 Sepsis, unspecified organism: Principal | ICD-10-CM

## 2017-12-05 HISTORY — DX: Unspecified severe protein-calorie malnutrition: E43

## 2017-12-05 HISTORY — DX: Anemia in other chronic diseases classified elsewhere: D63.8

## 2017-12-05 HISTORY — DX: Acute kidney failure, unspecified: N17.9

## 2017-12-05 HISTORY — DX: Abnormal levels of other serum enzymes: R74.8

## 2017-12-05 LAB — GASTROINTESTINAL PANEL BY PCR, STOOL (REPLACES STOOL CULTURE)
ASTROVIRUS: NOT DETECTED
Adenovirus F40/41: NOT DETECTED
Campylobacter species: NOT DETECTED
Cryptosporidium: NOT DETECTED
Cyclospora cayetanensis: NOT DETECTED
ENTEROAGGREGATIVE E COLI (EAEC): NOT DETECTED
ENTEROPATHOGENIC E COLI (EPEC): NOT DETECTED
ENTEROTOXIGENIC E COLI (ETEC): NOT DETECTED
Entamoeba histolytica: NOT DETECTED
Giardia lamblia: NOT DETECTED
NOROVIRUS GI/GII: NOT DETECTED
Plesimonas shigelloides: NOT DETECTED
ROTAVIRUS A: NOT DETECTED
Salmonella species: NOT DETECTED
Sapovirus (I, II, IV, and V): NOT DETECTED
Shiga like toxin producing E coli (STEC): NOT DETECTED
Shigella/Enteroinvasive E coli (EIEC): NOT DETECTED
Vibrio cholerae: NOT DETECTED
Vibrio species: NOT DETECTED
Yersinia enterocolitica: NOT DETECTED

## 2017-12-05 LAB — CBC
HCT: 27.8 % — ABNORMAL LOW (ref 39.0–52.0)
Hemoglobin: 8.9 g/dL — ABNORMAL LOW (ref 13.0–17.0)
MCH: 28.5 pg (ref 26.0–34.0)
MCHC: 32 g/dL (ref 30.0–36.0)
MCV: 89.1 fL (ref 78.0–100.0)
PLATELETS: 266 10*3/uL (ref 150–400)
RBC: 3.12 MIL/uL — ABNORMAL LOW (ref 4.22–5.81)
RDW: 14.3 % (ref 11.5–15.5)
WBC: 24.9 10*3/uL — AB (ref 4.0–10.5)

## 2017-12-05 LAB — COMPREHENSIVE METABOLIC PANEL
ALBUMIN: 1.7 g/dL — AB (ref 3.5–5.0)
ALT: 80 U/L — ABNORMAL HIGH (ref 17–63)
ANION GAP: 8 (ref 5–15)
AST: 176 U/L — AB (ref 15–41)
Alkaline Phosphatase: 70 U/L (ref 38–126)
BILIRUBIN TOTAL: 1.7 mg/dL — AB (ref 0.3–1.2)
BUN: 35 mg/dL — ABNORMAL HIGH (ref 6–20)
CHLORIDE: 114 mmol/L — AB (ref 101–111)
CO2: 21 mmol/L — ABNORMAL LOW (ref 22–32)
Calcium: 7.3 mg/dL — ABNORMAL LOW (ref 8.9–10.3)
Creatinine, Ser: 1.29 mg/dL — ABNORMAL HIGH (ref 0.61–1.24)
GFR calc Af Amer: >60 mL/min (ref >60)
GLUCOSE: 71 mg/dL (ref 65–99)
POTASSIUM: 4.2 mmol/L (ref 3.5–5.1)
Sodium: 143 mmol/L (ref 135–145)
TOTAL PROTEIN: 6.9 g/dL (ref 6.5–8.1)

## 2017-12-05 LAB — HIV ANTIBODY (ROUTINE TESTING W REFLEX): HIV SCREEN 4TH GENERATION: NONREACTIVE

## 2017-12-05 LAB — MRSA PCR SCREENING: MRSA by PCR: NEGATIVE

## 2017-12-05 LAB — PREALBUMIN: PREALBUMIN: 5.2 mg/dL — AB (ref 18–38)

## 2017-12-05 MED ORDER — FENTANYL CITRATE (PF) 100 MCG/2ML IJ SOLN: 12.5000 ug | INTRAMUSCULAR | Status: DC | PRN

## 2017-12-05 MED ORDER — SODIUM CHLORIDE 0.9 % IV BOLUS (SEPSIS)
1000.0000 mL | Freq: Once | INTRAVENOUS | Status: AC
  Administered 2017-12-05: 1000 mL via INTRAVENOUS

## 2017-12-05 MED ORDER — ENSURE ENLIVE PO LIQD
237.0000 mL | ORAL | Status: DC
  Administered 2017-12-06 – 2017-12-17 (×12): 237 mL via ORAL

## 2017-12-05 MED ORDER — SODIUM BICARBONATE 650 MG PO TABS
650.0000 mg | ORAL_TABLET | Freq: Every day | ORAL | Status: DC
  Administered 2017-12-05 – 2017-12-13 (×9): 650 mg via ORAL
  Filled 2017-12-05 (×9): qty 1

## 2017-12-05 MED ORDER — MESALAMINE 4 G RE ENEM
4.0000 g | ENEMA | Freq: Every day | RECTAL | Status: DC
  Administered 2017-12-06 – 2017-12-17 (×7): 4 g via RECTAL
  Filled 2017-12-05 (×14): qty 60

## 2017-12-05 MED ORDER — GERHARDT'S BUTT CREAM
TOPICAL_CREAM | Freq: Two times a day (BID) | CUTANEOUS | Status: DC
  Administered 2017-12-05: 1 via TOPICAL
  Administered 2017-12-05 – 2017-12-18 (×26): via TOPICAL
  Filled 2017-12-05 (×2): qty 1

## 2017-12-05 NOTE — Progress Notes (Signed)
Initial Nutrition Assessment  DOCUMENTATION CODES:   Underweight, Severe malnutrition in context of chronic illness  INTERVENTION:   Ensure Enlive po daily, each supplement provides 350 kcal and 20 grams of protein  Vital Cuisine po BID, each supplement provides 520kcal and 22g of protein.   NUTRITION DIAGNOSIS:   Severe Malnutrition related to chronic illness(Chronic ulcerative colitis s/p total colectomy) as evidenced by severe fat depletion, severe muscle depletion  GOAL:   Patient will meet greater than or equal to 90% of their needs  MONITOR:   PO intake, Supplement acceptance, Skin, I & O's, Labs  REASON FOR ASSESSMENT:   Malnutrition Screening Tool    ASSESSMENT:   Pt with PMH of schizophrenia, anemia, chronic diarrhea and ulcerative colitis/chronic ulcerative enterocolitis s/p total colectomy (2003) and small intestine surgery (2016). Pt with ileal pouch-anal anastomosis, who presents with ongoing diarrhea and SOB, found sepsis secondary to UTI   Discussed pt with RN.  Pt did not interact with RD at visit, unsure of nutrition history. Per meal completion records, PO 100%.   C.Diff tested and negative.  Per MD note, everything pt consumes "runs right through him" and he may reside in undesirable/unsanitary living conditions. Pt at risk for malabsorption given total colectomy   Labs reviewed; BUN 35, Creatinine 1.29, Albumin 1.7, Prealbumin 5.2, Hemoglobin 8.9 Medications reviewed; sodium bicarbonate, NS at 125 mL/hr     NUTRITION - FOCUSED PHYSICAL EXAM:    Most Recent Value  Orbital Region  Severe depletion  Upper Arm Region  Severe depletion  Thoracic and Lumbar Region  Severe depletion  Buccal Region  Moderate depletion  Temple Region  Moderate depletion  Clavicle Bone Region  Severe depletion  Clavicle and Acromion Bone Region  Severe depletion  Scapular Bone Region  Severe depletion  Dorsal Hand  Moderate depletion  Patellar Region  Severe depletion   Anterior Thigh Region  Severe depletion  Posterior Calf Region  Moderate depletion  Edema (RD Assessment)  Unable to assess     Diet Order:  Diet Heart Room service appropriate? Yes; Fluid consistency: Thin  EDUCATION NEEDS:   Not appropriate for education at this time  Skin:  Skin Assessment: Skin Integrity Issues: Skin Integrity Issues:: Other (Comment)(MASD) Other: buttocks and hip  Last BM:  12/05/17  Height:   Ht Readings from Last 1 Encounters:  12/04/17 6' (1.829 m)   Weight:   Wt Readings from Last 1 Encounters:  12/04/17 120 lb (54.4 kg)    Ideal Body Weight:  80.9 kg  BMI:  Body mass index is 16.27 kg/m.  Estimated Nutritional Needs:   Kcal:  1700-1900  Protein:  85-95 grams  Fluid:  >/= 1.7 L/d  Parks Ranger, MS, RDN, LDN 12/05/2017 2:37 PM

## 2017-12-05 NOTE — Consult Note (Addendum)
Referring Provider: [facility]   Primary Care Physician:  Care, [name] [facility] Primary Gastroenterologist:  Monmouth Cellar, MD  Reason for Consultation:    Crohn's   ASSESSMENT AND PLAN:    102. 42 yo male admitted with hx of UC, s/p total colectomy. Endoscopic evaluation of pouch in June raised suspicion for Crohn's disease. Recently restarted on Budesonide. Admitted with weakness / diarrhea /significant weight loss / sepsis  -U/A abnormal with nitrites, RBCs. On antibiotics. Urine Cx pending -blood Cx pending -Source of sepsis?  GI source?  Already on Zosyn which has great anaerobic coverage.   -continue fluid resus. He has received 3 Liters bolus and now at 125 / hour.  -If no other obvious sources of sepsis found then needs steroids for IBD. May need repeat endoscopic evaluation of pouch  2. AKi, getting IVF.   3. Schizophrenia. Difficult to get a history from  HPI: Christian Lamb is a 42 y.o. male with a complicated IBD hx. He established care with Dr. Havery Moros June 2018 for management of UC. Marland KitchenHe is s/p remote total colectomy . When patient saw [place] in June he was having multiple BMs a day and not under care of GI . Scheduled for colonoscopy twice but cancelled. Finally underwent colonoscopy October 2018 revealing total colectomy with ileal pouch-anal anastomosis.  There was diffuse, moderate inflammation with mucus and deep ulcerations in the pouch and distal neoterminal ileum. Ileal biopsies compatible with abundant exudate and granulation, no granulomas.  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  Patient was started on budesonide with improvement in symptoms.  He didn't show for subsequent appts. Most recent appt was December but did not show. Patient called office mid Feb with diarrhea, he was no longer taking Budesonide. We restarted Budesonide and scheduled follow up.   Patient came to ED yesterday with SOB,  diarrhea and chills. C-diff is negative, path panel pending. His WBC is 25K. D-dimer elevated (sepsis related). Breathing better today. He doesn't offer much history. Initially said he wasn't having diarrhea then said he was having a lot of diarrhea, sometimes with blood. Weight down 16 pounds since end of December.   Past Medical History:  Diagnosis Date  . Anemia   . Anxiety   . Schizophrenia ([place])   . Ulcerative colitis [facility])     Past Surgical History:  Procedure Laterality Date  . SMALL INTESTINE SURGERY  2016   in [place]  . TOTAL COLECTOMY  2003    Prior to Admission medications   Medication Sig Start Date End Date Taking? Authorizing Provider  B Complex Vitamins (B COMPLEX PO) Take 1 capsule by mouth daily.   Yes [provider]  benztropine (COGENTIN) 0.5 MG tablet Take 0.5 mg by mouth Nightly.   Yes [provider]  budesonide (ENTOCORT EC) 3 MG 24 hr capsule Take 3 capsules (9 mg total) by mouth as directed. Patient taking differently: Take 9 mg by mouth daily.  11/19/17  Yes Armbruster, Carlota Raspberry, MD  paliperidone (INVEGA SUSTENNA) 156 MG/ML SUSP injection Inject 1 mL (156 mg total) into the muscle once. 02/20/16  Yes Hildred Priest, MD  paliperidone (INVEGA) 6 MG 24 hr tablet Take 2 tablets (12 mg total) by mouth at bedtime. 02/15/16   Hildred Priest, MD    Current Facility-Administered Medications  Medication Dose Route Frequency Provider Last Rate Last Dose  . 0.9 %  sodium chloride infusion   Intravenous Continuous Smith, Rondell A,  MD 125 mL/hr at 12/05/17 1200    . acetaminophen (TYLENOL) tablet 650 mg  650 mg Oral Q6H PRN Fuller Plan A, MD       Or  . acetaminophen (TYLENOL) suppository 650 mg  650 mg Rectal Q6H PRN Fuller Plan A, MD      . benztropine (COGENTIN) tablet 0.5 mg  0.5 mg Oral QHS Smith, Rondell A, MD   0.5 mg at 12/04/17 2317  . budesonide (ENTOCORT EC) 24 hr capsule 9 mg  9 mg Oral Daily Tamala Julian, Rondell A,  MD   9 mg at 12/05/17 0944  . enoxaparin (LOVENOX) injection 40 mg  40 mg Subcutaneous Q24H Tamala Julian, Rondell A, MD   40 mg at 12/04/17 2316  . fentaNYL (SUBLIMAZE) injection 12.5 mcg  12.5 mcg Intravenous Q2H PRN Fuller Plan A, MD      . Gerhardt's butt cream   Topical BID Florencia Reasons, MD      . ipratropium-albuterol (DUONEB) 0.5-2.5 (3) MG/3ML nebulizer solution 3 mL  3 mL Nebulization Q4H PRN Smith, Rondell A, MD      . nicotine (NICODERM CQ - dosed in mg/24 hours) patch 21 mg  21 mg Transdermal Q24H Smith, Rondell A, MD   21 mg at 12/04/17 2317  . ondansetron (ZOFRAN) tablet 4 mg  4 mg Oral Q6H PRN Fuller Plan A, MD       Or  . ondansetron (ZOFRAN) injection 4 mg  4 mg Intravenous Q6H PRN Smith, Rondell A, MD      . piperacillin-tazobactam (ZOSYN) IVPB 3.375 g  3.375 g Intravenous Q8H Pham, Anh P, RPH 12.5 mL/hr at 12/05/17 0735 3.375 g at 12/05/17 0735    Allergies as of 12/04/2017  . (No Known Allergies)    Family History  Problem Relation Age of Onset  . Colon cancer Father 27  . Stomach cancer Neg Hx     Social History   Socioeconomic History  . Marital status: Single    Spouse name: Not on file  . Number of children: Not on file  . Years of education: Not on file  . Highest education level: Not on file  Social Needs  . Financial resource strain: Not on file  . Food insecurity - worry: Not on file  . Food insecurity - inability: Not on file  . Transportation needs - medical: Not on file  . Transportation needs - non-medical: Not on file  Occupational History  . Not on file  Tobacco Use  . Smoking status: Current Every Day Smoker    Packs/day: 0.25    Types: Cigarettes, Cigars  . Smokeless tobacco: Never Used  Substance and Sexual Activity  . Alcohol use: No    Alcohol/week: 0.0 oz  . Drug use: No  . Sexual activity: Not Currently  Other Topics Concern  . Not on file  Social History Narrative  . Not on file    Review of Systems: All systems reviewed and  negative except where noted in HPI.  Physical Exam: Vital signs in last 24 hours: Temp:  [97.5 F (36.4 C)-102.3 F (39.1 C)] 100.7 F (38.2 C) (03/01 1122) Pulse Rate:  [123-152] 126 (03/01 0900) Resp:  [11-20] 14 (03/01 0900) BP: (90-120)/(53-75) 120/58 (03/01 0900) SpO2:  [1 %-100 %] 100 % (03/01 0900) Weight:  [120 lb (54.4 kg)] 120 lb (54.4 kg) (02/28 2107) Last BM Date: 12/05/17 General:   Alert, thin black male in NAD Psych:   cooperative.  Eyes:  Pupils equal,  sclera clear, no icterus.   Conjunctiva pink. Ears:  Normal auditory acuity. Nose:  No deformity, discharge,  or lesions. Neck:  Supple; no masses Lungs:  Clear throughout to auscultation.   No wheezes, crackles, or rhonchi.  Heart:  Regular rate and rhythm; no murmurs, 2+ pedal edema. Right toes very dark but + pedal pulse and skin warm Abdomen:  Soft, non-distended, nontender, BS active, no palp mass    Rectal:  Deferred  Msk:  Symmetrical without gross deformities. . Pulses:  Normal pulses noted. Neurologic:  Alert and  oriented x4;  grossly normal neurologically. Skin:  Intact without significant lesions or rashes..   Intake/Output from previous day: 02/28 0701 - 03/01 0700 In: 1375 [I.V.:1125; IV Piggyback:250] Out: 2505 [Urine:2125; Stool:380] Intake/Output this shift: Total I/O In: 625 [I.V.:625] Out: 1620 [Urine:1300; Stool:320]  Lab Results: Recent Labs    12/04/17 1629 12/05/17 0330  WBC 25.7* 24.9*  HGB 11.0* 8.9*  HCT 33.4* 27.8*  PLT 354 266   BMET Recent Labs    12/04/17 1820 12/05/17 0330  NA 140 143  K 5.2* 4.2  CL 110 114*  CO2 19* 21*  GLUCOSE 90 71  BUN 56* 35*  CREATININE 1.56* 1.29*  CALCIUM 7.5* 7.3*   LFT Recent Labs    12/05/17 0330  PROT 6.9  ALBUMIN 1.7*  AST 176*  ALT 80*  ALKPHOS 70  BILITOT 1.7*   PT/INR No results for input(s): LABPROT, INR in the last 72 hours. Hepatitis Panel No results for input(s): HEPBSAG, HCVAB, HEPAIGM, HEPBIGM in the last  72 hours.    Studies/Results: Dg Chest 2 View  Result Date: 12/04/2017 CLINICAL DATA:  Shortness of breath. EXAM: CHEST  2 VIEW COMPARISON:  08/04/2008. FINDINGS: Mediastinum hilar structures are normal. Heart size normal. No focal infiltrate. No pleural effusion or pneumothorax. Thoracic spine scoliosis. IMPRESSION: No acute cardiopulmonary disease. Electronically Signed   By: Marcello Moores  Register   On: 12/04/2017 16:05   Dg Chest Port 1 View  Result Date: 12/04/2017 CLINICAL DATA:  42 year old male with weight loss, diarrhea, and sepsis. EXAM: PORTABLE CHEST 1 VIEW COMPARISON:  Chest radiograph dated 12/04/2017 FINDINGS: The lungs are clear. There is no pleural effusion pneumothorax. The cardiac silhouette is within normal limits. No acute osseous pathology. IMPRESSION: No active disease. Electronically Signed   By: Anner Crete M.D.   On: 12/04/2017 21:15   Dg Foot Complete Left  Result Date: 12/04/2017 CLINICAL DATA:  Foot pain.  Information. EXAM: LEFT FOOT - COMPLETE 3+ VIEW COMPARISON:  None. FINDINGS: No acute bony abnormality identified. No evidence of fracture dislocation. Mild degenerative changes first MTP joint. No evidence of inflammatory arthropathy. No radiopaque foreign body. IMPRESSION: Mild degenerative changes first MTP joint. No inflammatory arthropathy. No acute bony abnormality. Electronically Signed   By: Marcello Moores  Register   On: 12/04/2017 16:08   Dg Foot Complete Right  Result Date: 12/04/2017 CLINICAL DATA:  Foot pain with inflammation EXAM: RIGHT FOOT COMPLETE - 3+ VIEW COMPARISON:  Left foot radiographs 12/04/2017 FINDINGS: No fracture or malalignment. No definite periostitis or bone destruction. No radiopaque foreign body or soft tissue gas. IMPRESSION: No definite acute osseous abnormality Electronically Signed   By: Donavan Foil M.D.   On: 12/04/2017 16:07     Tye Savoy, NP-C @  12/05/2017, 12:18 PM  Pager number [phone]   Attending physician's note     I have taken a history, examined the patient and reviewed the chart. I agree with the Advanced  Practitioner's note, impression and recommendations. 42 year old male with history of ulcerative colitis status post colectomy, with J-pouch, evidence of diffuse inflammation and deep ulceration in distal neoterminal ileum and ileal anal pouch suspicious for possible Crohn's disease.  Patient is admitted with diarrhea, leukocytosis and failure to thrive. Unclear source for significant leukocytosis and sepsis, possible UTI. C. difficile negative, follow-up GI pathogen panel Continue supportive care and IV fluids May need to consider high-dose steroids once sepsis resolves.  Continue budesonide for now. Currently on broad-spectrum antibiotic, Zosyn [place] enema at bedtime daily if patient is able to hold in the [place], MD [phone] Mon-Fri 8a-5p [phone] after 5p, weekends, holidays

## 2017-12-05 NOTE — Consult Note (Signed)
[place] Nurse wound consult note Reason for Consult:Moisture associated skin damage to left hip and buttocks from excessive diarrhea and exposure to urine and stool during acute illness, present on admission. Has fecal management system in place and dermatherapy therapeutic linen in contact with skin to improve skin microclimate.  Wound type:MASD, incontinence associated. Pressure Injury POA: NA Measurement:LEft hip:  22 cm x 3 cm x 0.2 cm  Buttocks:  Both gluteal areas affected:  Each measures:  8 cm x 6 cm x 0.2 cm  Wound VHO:YWVX and moist Drainage (amount, consistency, odor) minimal serous weeping Periwound:intact Dressing procedure/placement/frequency:Cleanse buttocks and left hip wounds with NS and pat dry.  Apply GErhardts butt paste twice daily and PRN soilage. No disposable briefs or underpads while in bed.  Dermatherapy will improve skin Will not follow at this time.  Please re-consult if needed.  Domenic Moras RN BSN [place] Pager [phone]

## 2017-12-05 NOTE — Progress Notes (Signed)
PT Cancellation Note  Patient Details Name: Denario Bagot MRN: [id number] DOB: [birth year]   Cancelled Treatment:    Reason Eval/Treat Not Completed: Medical issues which prohibited therapy. Patient's HR in 120' at rest. Will check back another time.   Claretha Cooper 12/05/2017, 9:45 AM  Tresa Endo PT [phone]

## 2017-12-05 NOTE — Progress Notes (Signed)
LE venous duplex prelim: negative for DVT. Christian Lamb, RDMS, RVT  

## 2017-12-05 NOTE — Progress Notes (Signed)
PROGRESS NOTE  Christian Lamb WLS:[id number] DOB: [birth year] DOA: 12/04/2017 PCP: Care, [name] [facility]  HPI/Recap of past 24 hours:  Poor historian, he is sitting up in bed eating,  Watery stool in rectal tube, he has sinus tachycardia, he denies pain, no fever, no hypoxia, no n/v His legs are swollen  Assessment/Plan: Principal Problem:   Sepsis ([place]) Active Problems:   Schizoaffective disorder, bipolar type ([place])   Tobacco use disorder   Ulcerative colitis with complication ([place])   Anemia of chronic disease   AKI (acute kidney injury) ([place])   Rhabdomyolysis   Elevated liver enzymes  Sepsis presented on admission -Patient presents febrile up to 102.3 F with WBC 25.7.  Lactic acid was reassuring at 1.77,  -Source of infection including UTI, GI source, he also have open wound to left hip and buttocks -Blood culture no growth, MRSA screen negative, C. difficile negative, GI PCR panel negative -He is started on ivf,  Zosyn since admission will continue for now, follow-up on urine culture  AK and CKD D3, rhabdomyolysis IV fluid, renal dosing medication, repeat labs in a.m.  Elevated LFT -Drug screen negative, alcohol less than 10 on presentation -LFT elevation likely from sepsis and rhabdomyolysis,  -hepatitis/hiv in process, get right upper quadrant [place],  -repeat lab in am  Ulcerative colitis with diarrhea GI panel negative C. difficile negative Continue Budesonide Rectal tube in place Lower GI consulted  Metabolic acidosis  likely from diarrhea and AK I  bicarb supplement  Lower extremity edema and discoloration of right toes - Check d-dimer - Check Doppler vascular ultrasound - Follow-up vascular surgery recommendations    Skin wound, wound care input appreciated: Reason for Consult:Moisture associated skin damage to left hip and buttocks from excessive diarrhea and exposure to urine and stool during acute illness, present on admission. Has fecal  management system in place and dermatherapy therapeutic linen in contact with skin to improve skin microclimate.  Wound type:MASD, incontinence associated. Pressure Injury POA: NA Measurement:LEft hip:  22 cm x 3 cm x 0.2 cm  Buttocks:  Both gluteal areas affected:  Each measures:  8 cm x 6 cm x 0.2 cm  Wound OTL:XBWI and moist Drainage (amount, consistency, odor) minimal serous weeping Periwound:intact Dressing procedure/placement/frequency:Cleanse buttocks and left hip wounds with NS and pat dry.  Apply GErhardts butt paste twice daily and PRN soilage. No disposable briefs or underpads while in bed.    Underweight, Severe malnutrition in context of chronic illness severe fat depletion, severe muscle depletion Nutrition input appreciated    Schizophrenia/schizoaffective disorder - Continue Cogentin   Tobacco abuse: Patient reports smoking 1 pack cigarettes per day on average - Nicotine patch offered    Code Status: full  Family Communication: patient   Disposition Plan: remain in stepdown   Consultants:  lbgi  Procedures:  Rectal tube insertion  Antibiotics:  Zosyn since admission   Objective: BP 116/69   Pulse (!) 123   Temp 99.1 F (37.3 C) (Oral)   Resp 17   Ht 6' (1.829 m) Comment: per patient  Wt 54.4 kg (120 lb)   SpO2 100%   BMI 16.27 kg/m   Intake/Output Summary (Last 24 hours) at 12/05/2017 0742 Last data filed at 12/05/2017 0700 Gross per 24 hour  Intake 1250 ml  Output 2505 ml  Net -1255 ml   Filed Weights   12/04/17 2107  Weight: 54.4 kg (120 lb)    Exam: Patient is examined daily including today on 12/05/2017,  exams remain the same as of yesterday except that has changed    General:  NAD, poor historian, thin  Cardiovascular: sinus tachycardia  Respiratory: CTABL  Abdomen: Soft/ND/NT, positive BS  Musculoskeletal: bilateral lower extremity Edema  Neuro: alert, oriented , poor historian  Data Reviewed: Basic Metabolic  Panel: Recent Labs  Lab 12/04/17 1820 12/05/17 0330  NA 140 143  K 5.2* 4.2  CL 110 114*  CO2 19* 21*  GLUCOSE 90 71  BUN 56* 35*  CREATININE 1.56* 1.29*  CALCIUM 7.5* 7.3*  MG 2.4  --   PHOS 4.1  --    Liver Function Tests: Recent Labs  Lab 12/04/17 1820 12/05/17 0330  AST 164* 176*  ALT 88* 80*  ALKPHOS 84 70  BILITOT 1.6* 1.7*  PROT 7.4 6.9  ALBUMIN 2.0* 1.7*   Recent Labs  Lab 12/04/17 1820  LIPASE 21   No results for input(s): AMMONIA in the last 168 hours. CBC: Recent Labs  Lab 12/04/17 1629 12/05/17 0330  WBC 25.7* 24.9*  NEUTROABS 22.1*  --   HGB 11.0* 8.9*  HCT 33.4* 27.8*  MCV 86.5 89.1  PLT 354 266   Cardiac Enzymes:   Recent Labs  Lab 12/04/17 1953  CKTOTAL 6,022*  CKMB 50.2*   BNP (last 3 results) No results for input(s): BNP in the last 8760 hours.  ProBNP (last 3 results) No results for input(s): PROBNP in the last 8760 hours.  CBG: No results for input(s): GLUCAP in the last 168 hours.  Recent Results (from the past 240 hour(s))  C difficile quick scan w PCR reflex     Status: None   Collection Time: 12/04/17  7:16 PM  Result Value Ref Range Status   C Diff antigen NEGATIVE NEGATIVE Final   C Diff toxin NEGATIVE NEGATIVE Final   C Diff interpretation No C. difficile detected.  Final    Comment: Performed at [facility], [place] [address], [place], [place] [address]  MRSA PCR Screening     Status: None   Collection Time: 12/04/17 11:29 PM  Result Value Ref Range Status   MRSA by PCR NEGATIVE NEGATIVE Final    Comment:        The GeneXpert MRSA Assay (FDA approved for NASAL specimens only), is one component of a comprehensive MRSA colonization surveillance program. It is not intended to diagnose MRSA infection nor to guide or monitor treatment for MRSA infections. Performed at [facility], [place] [address], [place], [place] [address]      Studies: Dg Chest 2 View  Result Date:  12/04/2017 CLINICAL DATA:  Shortness of breath. EXAM: CHEST  2 VIEW COMPARISON:  08/04/2008. FINDINGS: Mediastinum hilar structures are normal. Heart size normal. No focal infiltrate. No pleural effusion or pneumothorax. Thoracic spine scoliosis. IMPRESSION: No acute cardiopulmonary disease. Electronically Signed   By: Marcello Moores  Register   On: 12/04/2017 16:05   Dg Chest Port 1 View  Result Date: 12/04/2017 CLINICAL DATA:  42 year old male with weight loss, diarrhea, and sepsis. EXAM: PORTABLE CHEST 1 VIEW COMPARISON:  Chest radiograph dated 12/04/2017 FINDINGS: The lungs are clear. There is no pleural effusion pneumothorax. The cardiac silhouette is within normal limits. No acute osseous pathology. IMPRESSION: No active disease. Electronically Signed   By: Anner Crete M.D.   On: 12/04/2017 21:15   Dg Foot Complete Left  Result Date: 12/04/2017 CLINICAL DATA:  Foot pain.  Information. EXAM: LEFT FOOT - COMPLETE 3+ VIEW COMPARISON:  None. FINDINGS:  No acute bony abnormality identified. No evidence of fracture dislocation. Mild degenerative changes first MTP joint. No evidence of inflammatory arthropathy. No radiopaque foreign body. IMPRESSION: Mild degenerative changes first MTP joint. No inflammatory arthropathy. No acute bony abnormality. Electronically Signed   By: Marcello Moores  Register   On: 12/04/2017 16:08   Dg Foot Complete Right  Result Date: 12/04/2017 CLINICAL DATA:  Foot pain with inflammation EXAM: RIGHT FOOT COMPLETE - 3+ VIEW COMPARISON:  Left foot radiographs 12/04/2017 FINDINGS: No fracture or malalignment. No definite periostitis or bone destruction. No radiopaque foreign body or soft tissue gas. IMPRESSION: No definite acute osseous abnormality Electronically Signed   By: Donavan Foil M.D.   On: 12/04/2017 16:07    Scheduled Meds: . benztropine  0.5 mg Oral QHS  . budesonide  9 mg Oral Daily  . enoxaparin (LOVENOX) injection  40 mg Subcutaneous Q24H  . nicotine  21 mg Transdermal  Q24H    Continuous Infusions: . sodium chloride 125 mL/hr at 12/04/17 2300  . piperacillin-tazobactam (ZOSYN)  IV 3.375 g (12/05/17 0735)  . sodium chloride    . vancomycin       Time spent: 35 mins I have personally reviewed and interpreted on  12/05/2017 daily labs, tele strips, imagings as discussed above under date review session and assessment and plans.  I reviewed all nursing notes, pharmacy notes, consultant notes,  vitals, pertinent old records  I have discussed plan of care as described above with RN , patient and family on 12/05/2017   Florencia Reasons MD, PhD  [facility] Pager [phone]. If 7PM-7AM, please contact night-coverage at [email or web address], password [facility] 12/05/2017, 7:42 AM  LOS: 1 day

## 2017-12-05 NOTE — Progress Notes (Signed)
At approximately 0115 MD [place] notified about pt's consistent HR in the 130-140s.  Orders received to give 1L bolus of NS and bolus given at 0150.

## 2017-12-06 DIAGNOSIS — R358 Other polyuria: Secondary | ICD-10-CM

## 2017-12-06 DIAGNOSIS — K51919 Ulcerative colitis, unspecified with unspecified complications: Secondary | ICD-10-CM

## 2017-12-06 LAB — COMPREHENSIVE METABOLIC PANEL
ALK PHOS: 72 U/L (ref 38–126)
ALT: 73 U/L — ABNORMAL HIGH (ref 17–63)
ANION GAP: 6 (ref 5–15)
AST: 133 U/L — ABNORMAL HIGH (ref 15–41)
Albumin: 1.4 g/dL — ABNORMAL LOW (ref 3.5–5.0)
BILIRUBIN TOTAL: 1.4 mg/dL — AB (ref 0.3–1.2)
BUN: 17 mg/dL (ref 6–20)
CALCIUM: 7.6 mg/dL — AB (ref 8.9–10.3)
CO2: 22 mmol/L (ref 22–32)
Chloride: 110 mmol/L (ref 101–111)
Creatinine, Ser: 1.12 mg/dL (ref 0.61–1.24)
GFR calc Af Amer: >60 mL/min (ref >60)
Glucose, Bld: 100 mg/dL — ABNORMAL HIGH (ref 65–99)
POTASSIUM: 3.4 mmol/L — AB (ref 3.5–5.1)
Sodium: 138 mmol/L (ref 135–145)
TOTAL PROTEIN: 6.3 g/dL — AB (ref 6.5–8.1)

## 2017-12-06 LAB — TSH: TSH: 1.505 u[IU]/mL (ref 0.350–4.500)

## 2017-12-06 LAB — CBC
HEMATOCRIT: 24.5 % — AB (ref 39.0–52.0)
HEMOGLOBIN: 7.8 g/dL — AB (ref 13.0–17.0)
MCH: 27.8 pg (ref 26.0–34.0)
MCHC: 31.8 g/dL (ref 30.0–36.0)
MCV: 87.2 fL (ref 78.0–100.0)
Platelets: 329 10*3/uL (ref 150–400)
RBC: 2.81 MIL/uL — ABNORMAL LOW (ref 4.22–5.81)
RDW: 14.4 % (ref 11.5–15.5)
WBC: 32.1 10*3/uL — ABNORMAL HIGH (ref 4.0–10.5)

## 2017-12-06 LAB — URINE CULTURE

## 2017-12-06 LAB — HEPATITIS PANEL, ACUTE
HCV AB: 0.1 {s_co_ratio} (ref 0.0–0.9)
HEP A IGM: NEGATIVE
Hep B C IgM: NEGATIVE
Hepatitis B Surface Ag: NEGATIVE

## 2017-12-06 LAB — CK: Total CK: 4589 U/L — ABNORMAL HIGH (ref 49–397)

## 2017-12-06 MED ORDER — POTASSIUM CHLORIDE CRYS ER 20 MEQ PO TBCR
40.0000 meq | EXTENDED_RELEASE_TABLET | Freq: Once | ORAL | Status: AC
  Administered 2017-12-06: 40 meq via ORAL
  Filled 2017-12-06: qty 2

## 2017-12-06 MED ORDER — CHOLESTYRAMINE LIGHT 4 G PO PACK
4.0000 g | PACK | ORAL | Status: DC
  Administered 2017-12-06 – 2017-12-18 (×36): 4 g via ORAL
  Filled 2017-12-06 (×42): qty 1

## 2017-12-06 MED ORDER — DIPHENOXYLATE-ATROPINE 2.5-0.025 MG PO TABS
1.0000 | ORAL_TABLET | Freq: Three times a day (TID) | ORAL | Status: DC
  Administered 2017-12-06 – 2017-12-13 (×23): 1 via ORAL
  Filled 2017-12-06 (×23): qty 1

## 2017-12-06 MED ORDER — CHLORPROMAZINE HCL 25 MG PO TABS
25.0000 mg | ORAL_TABLET | Freq: Three times a day (TID) | ORAL | Status: DC | PRN
  Administered 2017-12-06 – 2017-12-09 (×5): 25 mg via ORAL
  Filled 2017-12-06 (×7): qty 1

## 2017-12-06 NOTE — Progress Notes (Addendum)
PROGRESS NOTE  Christian Lamb MMN:[id number] DOB: [birth year] DOA: 12/04/2017    PCP: [name]  Subjective: No events overnight, pt still with intermittent tachy, says he feels better but bothered with hiccups.   Assessment/Plan:  Sepsis present on admission - sepsis criteria met on admission, T 102.60F, WBC 25 K - source of infection ? UTI, GI source, L hip and buttock area open wound - so far blood cultures with no growth, urine culture with multiple species present, C. Diff neg, GI stool panel negative  - pt has been on Zosyn, will continue same regimen for now  AK injury and CKD3, rhabdomyolysis - responded to IVF - CK level trending down, Cr is also trending down and WNL this AM   Elevated LFT - Drug screen negative, alcohol < 10 on presentation - LFT elevation likely from sepsis and rhabdomyolysis - hep panel and HIV negative  - LFT's overall trending down  - CMP in AM  UC with diarrhea - stool panel negative - C. Diff negative  - continue budesonide - GI team following, appreciate assistance  - pt also started on cholestyramine 4 gm TID, Lomotil TID   Hypokalemia - still low - will supplement and repeat BMP in AM  Metabolic acidosis - suspect from diarrhea and dehydration - on bicarb supplement  Anemia  - suspect from chronic illness, UC - no evidence of active bleeding  - CBC in AM  Lower extremity edema and discoloration of right toes - Doppler vascular ultrasound negative   - check ABI  Skin wound, wound care input appreciated - Moisture associated skin damage to left hip and buttocks from excessive diarrhea and exposure to urine and stool during acute illness, present on admission.  - Left hip:  22 cm x 3 cm x 0.2 cm  - Buttocks:  Both gluteal areas affected:  Each measures:  8 cm x 6 cm x 0.2 cm  - WOC recommendations: Cleanse buttocks and left hip wounds with NS and pat dry.  Apply GErhardts butt paste twice daily and PRN  soilage. No disposable briefs or underpads while in bed.    Severe PCM, underweight, hypoalbuminemia  - appreciate nutritionist recommendations  - Body mass index is 16.27 kg/m.  Schizophrenia/schizoaffective disorder - Continue Cogentin   Tobacco abuse - cont nicotine patch   Hiccups - place on trazodone   Code Status: full  Family Communication: pt and family at bedside   Disposition Plan: still in SDU as BP soft in 90's still with tachy and fevers   Consultants:  [facility]  Procedures:  Rectal tube insertion  Antibiotics:  Zosyn since admission  Objective: BP (!) 106/54   Pulse (!) 121   Temp 98.1 F (36.7 C) (Oral)   Resp 12   Ht 6' (1.829 m) Comment: per patient  Wt 54.4 kg (120 lb)   SpO2 100%   BMI 16.27 kg/m   Intake/Output Summary (Last 24 hours) at 12/06/2017 1306 Last data filed at 12/06/2017 1100 Gross per 24 hour  Intake 2850 ml  Output 3100 ml  Net -250 ml   Filed Weights   12/04/17 2107  Weight: 54.4 kg (120 lb)   Physical Exam  Constitutional: Appears calm, NAD.  CVS: RRR, S1/S2 +, no murmurs, no gallops, no carotid bruit.  Pulmonary: Effort and breath sounds normal, no stridor, rhonchi, wheezes, rales.  Abdominal: Soft. BS +,  no distension, tenderness, rebound or guarding.  Musculoskeletal: Bilateral LE edema with discolored  toes on both feet   Data Reviewed: Basic Metabolic Panel: Recent Labs  Lab 12/04/17 1820 12/05/17 0330 12/06/17 0323  NA 140 143 138  K 5.2* 4.2 3.4*  CL 110 114* 110  CO2 19* 21* 22  GLUCOSE 90 71 100*  BUN 56* 35* 17  CREATININE 1.56* 1.29* 1.12  CALCIUM 7.5* 7.3* 7.6*  MG 2.4  --   --   PHOS 4.1  --   --    Liver Function Tests: Recent Labs  Lab 12/04/17 1820 12/05/17 0330 12/06/17 0323  AST 164* 176* 133*  ALT 88* 80* 73*  ALKPHOS 84 70 72  BILITOT 1.6* 1.7* 1.4*  PROT 7.4 6.9 6.3*  ALBUMIN 2.0* 1.7* 1.4*   Recent Labs  Lab 12/04/17 1820  LIPASE 21   CBC: Recent Labs  Lab  12/04/17 1629 12/05/17 0330 12/06/17 0323  WBC 25.7* 24.9* 32.1*  NEUTROABS 22.1*  --   --   HGB 11.0* 8.9* 7.8*  HCT 33.4* 27.8* 24.5*  MCV 86.5 89.1 87.2  PLT 354 266 329   Cardiac Enzymes:   Recent Labs  Lab 12/04/17 1953 12/06/17 0323  CKTOTAL 6,022* 4,589*  CKMB 50.2*  --    Recent Results (from the past 240 hour(s))  Blood Culture (routine x 2)     Status: None (Preliminary result)   Collection Time: 12/04/17  6:20 PM  Result Value Ref Range Status   Specimen Description   Final    BLOOD LEFT ARM Performed at [facility] [address], [place], [place] [address]    Special Requests   Final    IN PEDIATRIC BOTTLE Blood Culture adequate volume Performed at [facility] [address], [place], [place] [address]    Culture   Final    NO GROWTH < 12 HOURS Performed at [name] [address] [place], [place] [address]    Report Status PENDING  Incomplete  Blood Culture (routine x 2)     Status: None (Preliminary result)   Collection Time: 12/04/17  6:46 PM  Result Value Ref Range Status   Specimen Description   Final    BLOOD RIGHT ANTECUBITAL Performed at [facility] [address], [place], [place] [address]    Special Requests   Final    BOTTLES DRAWN AEROBIC AND ANAEROBIC Blood Culture adequate volume Performed at [facility] [address] [place], [place] [address]    Culture   Final    NO GROWTH < 12 HOURS Performed at [facility] [address] [place], [place] [address]    Report Status PENDING  Incomplete  C difficile quick scan w PCR reflex     Status: None   Collection Time: 12/04/17  7:16 PM  Result Value Ref Range Status   C Diff antigen NEGATIVE NEGATIVE Final   C Diff toxin NEGATIVE NEGATIVE Final   C Diff interpretation No C. difficile detected.  Final    Comment: Performed at [facility], [place] [address]  [place], [place] [address]  Gastrointestinal Panel by PCR , Stool     Status: None   Collection Time: 12/04/17  7:16 PM  Result Value Ref Range Status   Campylobacter species NOT DETECTED NOT DETECTED Final   Plesimonas shigelloides NOT DETECTED NOT DETECTED Final   Salmonella species NOT DETECTED NOT DETECTED Final   Yersinia enterocolitica NOT DETECTED NOT DETECTED Final   Vibrio species  NOT DETECTED NOT DETECTED Final   Vibrio cholerae NOT DETECTED NOT DETECTED Final   Enteroaggregative E coli (EAEC) NOT DETECTED NOT DETECTED Final   Enteropathogenic E coli (EPEC) NOT DETECTED NOT DETECTED Final   Enterotoxigenic E coli (ETEC) NOT DETECTED NOT DETECTED Final   Shiga like toxin producing E coli (STEC) NOT DETECTED NOT DETECTED Final   Shigella/Enteroinvasive E coli (EIEC) NOT DETECTED NOT DETECTED Final   Cryptosporidium NOT DETECTED NOT DETECTED Final   Cyclospora cayetanensis NOT DETECTED NOT DETECTED Final   Entamoeba histolytica NOT DETECTED NOT DETECTED Final   Giardia lamblia NOT DETECTED NOT DETECTED Final   Adenovirus F40/41 NOT DETECTED NOT DETECTED Final   Astrovirus NOT DETECTED NOT DETECTED Final   Norovirus GI/GII NOT DETECTED NOT DETECTED Final   Rotavirus A NOT DETECTED NOT DETECTED Final   Sapovirus (I, II, IV, and V) NOT DETECTED NOT DETECTED Final    Comment: Performed at [facility], [address] [place], [place] [address]  MRSA PCR Screening     Status: None   Collection Time: 12/04/17 11:29 PM  Result Value Ref Range Status   MRSA by PCR NEGATIVE NEGATIVE Final    Comment:        The GeneXpert MRSA Assay (FDA approved for NASAL specimens only), is one component of a comprehensive MRSA colonization surveillance program. It is not intended to diagnose MRSA infection nor to guide or monitor treatment for MRSA infections. Performed at [facility], [place] [address], [place], [place] [address]   Culture, Urine     Status: Abnormal    Collection Time: 12/05/17  9:43 AM  Result Value Ref Range Status   Specimen Description   Final    URINE, CLEAN CATCH Performed at [facility], [place] [address], [place], [place] [address]    Special Requests   Final    NONE Performed at [facility], [place] [address], [place], [place] [address]    Culture MULTIPLE SPECIES PRESENT, SUGGEST RECOLLECTION (A)  Final   Report Status 12/06/2017 FINAL  Final    Studies: No results found.  Scheduled Meds: . benztropine  0.5 mg Oral QHS  . budesonide  9 mg Oral Daily  . cholestyramine  4 g Oral 3 times per day  . diphenoxylate-atropine  1 tablet Oral TID  . enoxaparin (LOVENOX) injection  40 mg Subcutaneous Q24H  . feeding supplement (ENSURE ENLIVE)  237 mL Oral Q24H  . Gerhardt's butt cream   Topical BID  . mesalamine  4 g Rectal QHS  . nicotine  21 mg Transdermal Q24H  . sodium bicarbonate  650 mg Oral Daily   Continuous Infusions: . sodium chloride 1,000 mL (12/06/17 1255)  . piperacillin-tazobactam (ZOSYN)  IV Stopped (12/06/17 1035)   Time spent: 25 minutes with > 50% of time discussing current diagnostic test results, clinical impression and plan of care.  Faye Ramsay MD [facility] Pager [phone] If 7PM-7AM, please contact night-coverage at [email or web address], password [facility]  12/06/2017, 1:06 PM  LOS: 2 days

## 2017-12-06 NOTE — Progress Notes (Signed)
PT Cancellation Note  Patient Details Name: Christian Lamb MRN: [id number] DOB: [birth year]   Cancelled Treatment:    Reason Eval/Treat Not Completed: Patient not medically ready, noted multiple issues with decreased hgb and buttock skin issues. Will check back as patient improves to tolerate mobility.    Claretha Cooper 12/06/2017, 7:31 AM  Tresa Endo PT [phone]

## 2017-12-06 NOTE — Progress Notes (Addendum)
[facility] Progress Note   Chief Complaint:   IBD  SUBJECTIVE:   sign Polyuria, even at home. No significant abdominal pain.    ASSESSMENT AND PLAN:   65. 42 yo male with UC, s/p colectomy with J-pouch. Inflamed and ulcerated neoterminal ileum and ileal anal pouch suspicious for Crohn's was found on endoscopic eval in June. Patient admitted with weight loss, severe diarrhea and sepsis. Sepsis etiology not clear but GI source suspected. Stool output 1600 yesterday . Stool for c-diff and path panel negative. Stool Cx looks contaminated. Blood cx pending. Temp 102.9 last night, currently normal. His WBC is up to 32K today. On Zosyn (has great anaerobic coverage)  -continue budesonide  -Rowasa ordered last night but not given (probably because of rectal tube??).  -when sepsis resolves he may need IV steroids.  -Starting cholestyramine 4 grams TID and Lomotil TID  2. Polyuria. Denies dysuria. This was occurring even at home. U/A suspicous, culture appears contaminated. Recollect ?   3. Discolored right toes - almost black. Some swelling in that foot. Xrays were negative. Extremity warm. U/S reveals enlarged lymph nodes in both groins but no DVT   OBJECTIVE:      Vital signs in last 24 hours: Temp:  [98.1 F (36.7 C)-102.9 F (39.4 C)] 98.1 F (36.7 C) (03/02 0757) Pulse Rate:  [103-131] 115 (03/02 1100) Resp:  [12-26] 13 (03/02 1100) BP: (82-130)/(39-92) 110/48 (03/02 1100) SpO2:  [89 %-100 %] 100 % (03/02 1100) Last BM Date: 12/05/17 General:   Alert, thin black male in NAD EENT:  Normal hearing, non icteric sclera, conjunctive pink.  Heart:  Sinus tachy, 2+ right pedal edema Pulm: Normal respiratory effort, lungs CTA bilaterally without wheezes or crackles. Abdomen:  Soft, nondistended, nontender. Hyperactive bowel sounds, no masses felt. Neurologic:  Alert and  oriented x4;  grossly normal neurologically. Psych: cooperative.  Normal mood and  affect.   Intake/Output from previous day: 03/01 0701 - 03/02 0700 In: 2175 [I.V.:2125; IV Piggyback:50] Out: 0762 [Urine:3100; Stool:1620] Intake/Output this shift: Total I/O In: 1425 [I.V.:1375; IV Piggyback:50] Out: -   Lab Results: Recent Labs    12/04/17 1629 12/05/17 0330 12/06/17 0323  WBC 25.7* 24.9* 32.1*  HGB 11.0* 8.9* 7.8*  HCT 33.4* 27.8* 24.5*  PLT 354 266 329   BMET Recent Labs    12/04/17 1820 12/05/17 0330 12/06/17 0323  NA 140 143 138  K 5.2* 4.2 3.4*  CL 110 114* 110  CO2 19* 21* 22  GLUCOSE 90 71 100*  BUN 56* 35* 17  CREATININE 1.56* 1.29* 1.12  CALCIUM 7.5* 7.3* 7.6*   LFT Recent Labs    12/06/17 0323  PROT 6.3*  ALBUMIN 1.4*  AST 133*  ALT 73*  ALKPHOS 72  BILITOT 1.4*   Hepatitis Panel Recent Labs    12/04/17 1820  HEPBSAG Negative  HCVAB 0.1  HEPAIGM Negative  HEPBIGM Negative    Principal Problem:   Sepsis ([place]) Active Problems:   Schizoaffective disorder, bipolar type ([place])   Tobacco use disorder   Ulcerative colitis with complication ([place])   Anemia of chronic disease   AKI (acute kidney injury) ([place])   Rhabdomyolysis   Elevated liver enzymes   Protein-calorie malnutrition, severe     LOS: 2 days   Tye Savoy ,NP 12/06/2017, 12:27 PM  Pager number [phone]   Attending physician's note   I have taken an interval history, reviewed the chart and examined the patient. I agree with the  Advanced Practitioner's note, impression and recommendations.  Worsening leucocytosis, hypotensive with persistent fever. On Zosyn.  He has watery bilious output in rectal bag 1600 cc in last 24 hours C.diff and GI path panel negative Will start Cholestyramine 1 packet TID and Lomotil TID Did not get Rowasa as rectal tube in place Will hold increasing steroid dose given ongoing sepsis  K Denzil Magnuson, MD [phone] Mon-Fri 8a-5p [phone] after 5p, weekends, holidays

## 2017-12-07 ENCOUNTER — Other Ambulatory Visit: Payer: Self-pay

## 2017-12-07 DIAGNOSIS — T34822A Frostbite with tissue necrosis of left foot, initial encounter: Secondary | ICD-10-CM

## 2017-12-07 DIAGNOSIS — T34821A Frostbite with tissue necrosis of right foot, initial encounter: Secondary | ICD-10-CM

## 2017-12-07 LAB — CBC
HCT: 23.8 % — ABNORMAL LOW (ref 39.0–52.0)
Hemoglobin: 7.6 g/dL — ABNORMAL LOW (ref 13.0–17.0)
MCH: 28.9 pg (ref 26.0–34.0)
MCHC: 31.9 g/dL (ref 30.0–36.0)
MCV: 90.5 fL (ref 78.0–100.0)
PLATELETS: 338 10*3/uL (ref 150–400)
RBC: 2.63 MIL/uL — AB (ref 4.22–5.81)
RDW: 14.6 % (ref 11.5–15.5)
WBC: 35 10*3/uL — AB (ref 4.0–10.5)

## 2017-12-07 LAB — COMPREHENSIVE METABOLIC PANEL
ALT: 70 U/L — AB (ref 17–63)
ANION GAP: 6 (ref 5–15)
AST: 97 U/L — ABNORMAL HIGH (ref 15–41)
Albumin: 1.3 g/dL — ABNORMAL LOW (ref 3.5–5.0)
Alkaline Phosphatase: 75 U/L (ref 38–126)
BUN: 15 mg/dL (ref 6–20)
CHLORIDE: 111 mmol/L (ref 101–111)
CO2: 21 mmol/L — AB (ref 22–32)
CREATININE: 0.86 mg/dL (ref 0.61–1.24)
Calcium: 7.5 mg/dL — ABNORMAL LOW (ref 8.9–10.3)
Glucose, Bld: 92 mg/dL (ref 65–99)
Potassium: 3.6 mmol/L (ref 3.5–5.1)
SODIUM: 138 mmol/L (ref 135–145)
Total Bilirubin: 0.9 mg/dL (ref 0.3–1.2)
Total Protein: 6.2 g/dL — ABNORMAL LOW (ref 6.5–8.1)

## 2017-12-07 LAB — CK: CK TOTAL: 3020 U/L — AB (ref 49–397)

## 2017-12-07 MED ORDER — VANCOMYCIN HCL 10 G IV SOLR
1250.0000 mg | INTRAVENOUS | Status: AC
  Administered 2017-12-07 – 2017-12-12 (×6): 1250 mg via INTRAVENOUS
  Filled 2017-12-07 (×6): qty 1250

## 2017-12-07 NOTE — Consult Note (Signed)
Reason for Consult: Frostbite all toes. Referring Physician: Dr. Garwin Brothers MD  Christian Lamb is an 42 y.o. male.  HPI: 42 year old male admitted after being found by his caseworker in bed with severe diarrhea buttocks ulcers and frostbite to the toes noted when patient was in a building with no heat no sanitary water which he terms a " junk house".  Patient has significant history of chronic diarrhea, ulcerative colitis, previous colectomy with ileal pouch/anal anastomosis.  Schizophrenia, chronic tachycardia, anemia of chronic disease, chronic shortness of breath and malnourishment.  Patient reports everything runs through him when he eats and is unable to make to the bathroom frequently in time.  Lying in bed with buttocks ulcer.  X-rays of the feet 12/04/2017 were negative for osteo-or fractures.  Past Medical History:  Diagnosis Date  . Anemia   . Anxiety   . Schizophrenia ([place])   . Ulcerative colitis [facility])     Past Surgical History:  Procedure Laterality Date  . SMALL INTESTINE SURGERY  2016   in [place]  . TOTAL COLECTOMY  2003    Family History  Problem Relation Age of Onset  . Colon cancer Father 51  . Stomach cancer Neg Hx     Social History:  reports that he has been smoking cigarettes and cigars.  He has been smoking about 0.25 packs per day. he has never used smokeless tobacco. He reports that he does not drink alcohol or use drugs.  Allergies: No Known Allergies  Medications: I have reviewed the patient's current medications.  Results for orders placed or performed during the hospital encounter of 12/04/17 (from the past 48 hour(s))  CK     Status: Abnormal   Collection Time: 12/06/17  3:23 AM  Result Value Ref Range   Total CK 4,589 (H) 49 - 397 U/L    Comment: RESULTS CONFIRMED BY MANUAL DILUTION Performed at [facility] [address], [place], [place] [address]   CBC     Status: Abnormal   Collection Time: 12/06/17  3:23 AM    Result Value Ref Range   WBC 32.1 (H) 4.0 - 10.5 K/uL    Comment: REPEATED TO VERIFY   RBC 2.81 (L) 4.22 - 5.81 MIL/uL   Hemoglobin 7.8 (L) 13.0 - 17.0 g/dL   HCT 24.5 (L) 39.0 - 52.0 %   MCV 87.2 78.0 - 100.0 fL   MCH 27.8 26.0 - 34.0 pg   MCHC 31.8 30.0 - 36.0 g/dL   RDW 14.4 11.5 - 15.5 %   Platelets 329 150 - 400 K/uL    Comment: Performed at [facility], [place] [address] [place], [place] [address]  Comprehensive metabolic panel     Status: Abnormal   Collection Time: 12/06/17  3:23 AM  Result Value Ref Range   Sodium 138 135 - 145 mmol/L   Potassium 3.4 (L) 3.5 - 5.1 mmol/L    Comment: DELTA CHECK NOTED   Chloride 110 101 - 111 mmol/L   CO2 22 22 - 32 mmol/L   Glucose, Bld 100 (H) 65 - 99 mg/dL   BUN 17 6 - 20 mg/dL   Creatinine, Ser 1.12 0.61 - 1.24 mg/dL   Calcium 7.6 (L) 8.9 - 10.3 mg/dL   Total Protein 6.3 (L) 6.5 - 8.1 g/dL   Albumin 1.4 (L) 3.5 - 5.0 g/dL   AST 133 (H) 15 - 41 U/L   ALT 73 (H) 17 - 63 U/L  Alkaline Phosphatase 72 38 - 126 U/L   Total Bilirubin 1.4 (H) 0.3 - 1.2 mg/dL   GFR calc non Af Amer >60 >60 mL/min   GFR calc Af Amer >60 >60 mL/min    Comment: (NOTE) The eGFR has been calculated using the CKD EPI equation. This calculation has not been validated in all clinical situations. eGFR's persistently <60 mL/min signify possible Chronic Kidney Disease.    Anion gap 6 5 - 15    Comment: Performed at [facility], [place] [address] [place], [place] [address]  TSH     Status: None   Collection Time: 12/06/17  3:23 AM  Result Value Ref Range   TSH 1.505 0.350 - 4.500 uIU/mL    Comment: Performed by a 3rd Generation assay with a functional sensitivity of <=0.01 uIU/mL. Performed at [facility], [place] [address], [place], [place] [address]   CK     Status: Abnormal   Collection Time: 12/07/17  3:17 AM  Result Value Ref Range   Total CK 3,020 (H) 49 - 397 U/L    Comment: Performed at [facility], [place] [address] [place], [place] [address]  Comprehensive metabolic panel     Status: Abnormal   Collection Time: 12/07/17  3:17 AM  Result Value Ref Range   Sodium 138 135 - 145 mmol/L   Potassium 3.6 3.5 - 5.1 mmol/L   Chloride 111 101 - 111 mmol/L   CO2 21 (L) 22 - 32 mmol/L   Glucose, Bld 92 65 - 99 mg/dL   BUN 15 6 - 20 mg/dL   Creatinine, Ser 0.86 0.61 - 1.24 mg/dL   Calcium 7.5 (L) 8.9 - 10.3 mg/dL   Total Protein 6.2 (L) 6.5 - 8.1 g/dL   Albumin 1.3 (L) 3.5 - 5.0 g/dL   AST 97 (H) 15 - 41 U/L   ALT 70 (H) 17 - 63 U/L   Alkaline Phosphatase 75 38 - 126 U/L   Total Bilirubin 0.9 0.3 - 1.2 mg/dL   GFR calc non Af Amer >60 >60 mL/min   GFR calc Af Amer >60 >60 mL/min    Comment: (NOTE) The eGFR has been calculated using the CKD EPI equation. This calculation has not been validated in all clinical situations. eGFR's persistently <60 mL/min signify possible Chronic Kidney Disease.    Anion gap 6 5 - 15    Comment: Performed at [facility], [place] [address] [place], [place] [address]  CBC     Status: Abnormal   Collection Time: 12/07/17  3:17 AM  Result Value Ref Range   WBC 35.0 (H) 4.0 - 10.5 K/uL   RBC 2.63 (L) 4.22 - 5.81 MIL/uL   Hemoglobin 7.6 (L) 13.0 - 17.0 g/dL   HCT 23.8 (L) 39.0 - 52.0 %   MCV 90.5 78.0 - 100.0 fL   MCH 28.9 26.0 - 34.0 pg   MCHC 31.9 30.0 - 36.0 g/dL   RDW 14.6 11.5 - 15.5 %   Platelets 338 150 - 400 K/uL    Comment: Performed at [facility], [place] [address] [place], [place] [address]    No results found.  ROS 14 point review of systems performed in chart review for review of systems performed and is negative as it pertains to HPI other than as mentioned above. Blood pressure 125/77, pulse (!) 109, temperature 98.7 F (37.1 C), temperature source Oral, resp. rate 17, height 6' (1.829 m),  weight 120 lb (54.4 kg), SpO2 100 %. Physical Exam  Constitutional:  Thin, malnourished  appearing, chronic ill appearance.  With rectal tube.  HENT:  Head: Normocephalic and atraumatic.  Eyes: EOM are normal.  Neck: Normal range of motion. Neck supple. No tracheal deviation present.  Cardiovascular: Normal rate.  Respiratory: Effort normal. He has no wheezes.  GI:  Positive bowel sounds.  No abdominal tenderness.  Good knee range of motion.  Lower extremity atrophy noted.  Large area of skin breakdown hips and buttocks.  There is frostbite worse in the right toes than left with second toe most severe involvement, black extending to the proximal phalanx.  Tip of the left toes show frostbite with black discoloration over the plantar surface without extension past the IP joint of the toes.  There is some swelling of the foot foot is warm.  Some edema of the foot without cellulitis present.  Assessment/Plan: Multiple problems likely UTI with multiple organisms and re-collection recommended per lab.  Chronic diarrhea with ulcerative colitis.  Frostbite to the toes.  No specific surgery at this time.  Likely will lose the second toe on the right.  Will require placement since he is in a non-sanitary  unheated house or building.  No evidence of osteo-on x-rays done on 12/04/2017.  Frostbite should be allowed to demarcate prior to recommendation for surgical debridement/amputation.  My cell [phone]  Marybelle Killings 12/07/2017, 2:42 PM

## 2017-12-07 NOTE — Progress Notes (Addendum)
[facility] Progress Note   Chief Complaint:  Ulcerative colitis / diarrhea   SUBJECTIVE:    Denies any abdominal pain. BP is better. He is feeling somewhat better. Leucocytosis continues to rise   ASSESSMENT AND PLAN:   1. 42 yo male with UC, s/p colectomy with J-pouch. Inflamed and ulcerated neoterminal ileum and ileal anal pouch suspicious for Crohn's found on endoscopic eval in June. On Budesonide.  -continue budesonide -continue cholestyramine and lomotil started yesterday afternoon. Stool appears a little thicker and there has been some decrease in volume  2. Sepsis. He has had weight loss / severe diarrhea. Sepsis source orginally suspected to be GI but c-diff and path panel are negative. No abdominal pain. His WBC is still rising, up to 35 K today. Blood cx pending. On Zosyn, does he need broader coverage?   I am concerned about his toes as source of sepsis, they are edematous and appear necrotic and have gotten progressively worse since I started seeing him a couple of days ago.  -I spoke with Hospitalist who is also concerned about toes. She had already consulted Vascular and may call Ortho as well.     OBJECTIVE:     Vital signs in last 24 hours: Temp:  [98.3 F (36.8 C)-99.9 F (37.7 C)] 98.3 F (36.8 C) (03/03 0800) Pulse Rate:  [85-124] 124 (03/03 0800) Resp:  [12-20] 19 (03/03 0800) BP: (95-116)/(47-68) 110/56 (03/03 0800) SpO2:  [94 %-100 %] 100 % (03/03 0800) Last BM Date: 12/06/17(flexiseal) General:   Alert, thin black male in NAD EENT:  Normal hearing, non icteric sclera, conjunctive pink.  Heart:  Sinus tach, 1 + left pedal edema, 2+ right pedal edema. Palpable pedal pulse on right, faint pedal pulse left foot.  Ext: black toes on both feet, worse on right Pulm: Normal respiratory effort Abdomen:  Soft, nondistended, nontender.  Normal bowel sounds, no masses felt.  Neurologic:  Alert and  oriented x4;  grossly normal  neurologically. Psych:  Pleasant, cooperative.  Normal mood and affect.   Intake/Output from previous day: 03/02 0701 - 03/03 0700 In: 4022.1 [I.V.:3822.1; IV Piggyback:200] Out: 3550 [Urine:2350; Stool:1200] Intake/Output this shift: Total I/O In: 297.9 [I.V.:297.9] Out: -   Lab Results: Recent Labs    12/05/17 0330 12/06/17 0323 12/07/17 0317  WBC 24.9* 32.1* 35.0*  HGB 8.9* 7.8* 7.6*  HCT 27.8* 24.5* 23.8*  PLT 266 329 338   BMET Recent Labs    12/05/17 0330 12/06/17 0323 12/07/17 0317  NA 143 138 138  K 4.2 3.4* 3.6  CL 114* 110 111  CO2 21* 22 21*  GLUCOSE 71 100* 92  BUN 35* 17 15  CREATININE 1.29* 1.12 0.86  CALCIUM 7.3* 7.6* 7.5*   LFT Recent Labs    12/07/17 0317  PROT 6.2*  ALBUMIN 1.3*  AST 97*  ALT 70*  ALKPHOS 75  BILITOT 0.9     Principal Problem:   Sepsis ([place]) Active Problems:   Schizoaffective disorder, bipolar type (HCC)   Tobacco use disorder   Ulcerative colitis with complication (HCC)   Anemia of chronic disease   AKI (acute kidney injury) ([place])   Rhabdomyolysis   Elevated liver enzymes   Protein-calorie malnutrition, severe     LOS: 3 days   Tye Savoy ,NP 12/07/2017, 10:53 AM  Pager number [phone]   Attending physician's note   I have taken an interval history, reviewed the chart and examined the patient. I agree with the Advanced  Practitioner's note, impression and recommendations.  42 yr M with IBD s/p colectomy, J pouch with pouchitis and also ulcerated TI with high stool output. Started on Lomotil and Cholestyramine yesterday with decrease in the volume. Will continue to monitor. GI pathogen panel is negative.  He continues to have rising leucocytosis, his toes in R foot appear necrotic, ?frost bite. Will need to consider broadening antibiotic coverage to include MRSA and consult ortho/vascular for evaluation  K Denzil Magnuson, MD [phone] Mon-Fri 8a-5p [phone] after 5p, weekends, holidays

## 2017-12-07 NOTE — Evaluation (Signed)
Physical Therapy Evaluation Patient Details Name: Christian Lamb MRN: [id number] DOB: 09/29/1976 Today's Date: 12/07/2017   History of Present Illness  Christian Lamb is a 42 y.o. 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  Patient  found to also have excoriated buttock s from diarrhea, also with darkened toes, for ABI's to eavaluate. negative DVT.  Clinical Impression  The patient tolerated sitting at the bedside x 8 minutes with supervion but required extensive assistance to move to the sitting position and back into bed , partly due to complaints of pain related to the excoriation of buttocks.  HR max 135, O2 sats 100% on 2 L. And BP initially sitting= 83/43, then 90/50. Did not c/o dizziness.. Limited evaluation to sitting due to HR and BP. Pt admitted with above diagnosis. Pt currently with functional limitations due to the deficits listed below (see PT Problem List).  Pt will benefit from skilled PT to increase their independence and safety with mobility to allow discharge to the venue listed below.  The patient reports decreased sensation of the feet with noted dry, malodorous dark toes bilaterally.     Follow Up Recommendations SNF    Equipment Recommendations  (TBA)    Recommendations for Other Services       Precautions / Restrictions Precautions Precautions: Fall Precaution Comments: rectal pouch, toes both feet ischemic and lack of sensation      Mobility  Bed Mobility Overal bed mobility: Needs Assistance Bed Mobility: Sit to Supine;Supine to Sit     Supine to sit: Max assist;+2 for physical assistance;HOB elevated;+2 for safety/equipment Sit to supine: Max assist;+2 for physical assistance;HOB elevated;+2 for  safety/equipment   General bed mobility comments: Patient required assistance using bed pad to slide patient to bed edge due to painful buttocks from excoriated skin. Assist with legs  and trunk. Use of bed pad to return to supine. patient sat on bed edge only x 8 minutes  Transfers                    Ambulation/Gait                Stairs            Wheelchair Mobility    Modified Rankin (Stroke Patients Only)       Balance Overall balance assessment: Needs assistance Sitting-balance support: Feet supported;Bilateral upper extremity supported Sitting balance-Leahy Scale: Fair                                       Pertinent Vitals/Pain Pain Assessment: Faces Faces Pain Scale: Hurts whole lot Pain Location: buttocks Pain Descriptors / Indicators: Burning;Discomfort;Grimacing;Guarding Pain Intervention(s): Monitored during session;Premedicated before session;Repositioned    Home Living Family/patient expects to be discharged to:: Unsure Living Arrangements: Alone                    Prior Function Level of Independence: Independent         Comments: pateint not forthcoming about his current situation, states hemoved to [place] 1 year ago, stayed with sister but has recently been in an apartment     Hand Dominance        Extremity/Trunk Assessment   Upper Extremity Assessment Upper  Extremity Assessment: Generalized weakness    Lower Extremity Assessment Lower Extremity Assessment: RLE deficits/detail;LLE deficits/detail RLE Deficits / Details: ischemic dark toes, edema of foot and lower leg, impaired senastion of foot, could not feel plantar on floor. RLE Sensation: decreased light touch RLE Coordination: decreased fine motor LLE Deficits / Details: ischemic looking of all toes , dark and dry and malodorous, impaired sensation    Cervical / Trunk Assessment Cervical / Trunk Assessment: Normal  Communication    Communication: No difficulties  Cognition Arousal/Alertness: Awake/alert Behavior During Therapy: WFL for tasks assessed/performed Overall Cognitive Status: Impaired/Different from baseline Area of Impairment: Orientation                 Orientation Level: Disoriented to;Time             General Comments: referred to calaendar but could not read it      General Comments      Exercises     Assessment/Plan    PT Assessment Patient needs continued PT services  PT Problem List Decreased strength;Decreased range of motion;Decreased knowledge of use of DME;Decreased activity tolerance;Decreased safety awareness;Decreased skin integrity;Decreased balance;Decreased knowledge of precautions;Pain;Decreased mobility;Cardiopulmonary status limiting activity;Impaired sensation       PT Treatment Interventions DME instruction;Therapeutic exercise;Wheelchair mobility training;Gait training;Balance training;Functional mobility training;Therapeutic activities;Patient/family education    PT Goals (Current goals can be found in the Care Plan section)  Acute Rehab PT Goals Patient Stated Goal: wants to walk, wants the feet to heal PT Goal Formulation: With patient Time For Goal Achievement: 12/21/17 Potential to Achieve Goals: Fair    Frequency Min 2X/week   Barriers to discharge Decreased caregiver support      Co-evaluation               AM-PAC PT "6 Clicks" Daily Activity  Outcome Measure Difficulty turning over in bed (including adjusting bedclothes, sheets and blankets)?: Unable Difficulty moving from lying on back to sitting on the side of the bed? : Unable Difficulty sitting down on and standing up from a chair with arms (e.g., wheelchair, bedside commode, etc,.)?: Unable Help needed moving to and from a bed to chair (including a wheelchair)?: Total Help needed walking in hospital room?: Total Help needed climbing 3-5 steps with a railing? : Total 6 Click Score:  6    End of Session   Activity Tolerance: Patient limited by fatigue;Treatment limited secondary to medical complications (Comment) Patient left: in bed;with call bell/phone within reach;with bed alarm set Nurse Communication: Mobility status PT Visit Diagnosis: Unsteadiness on feet (R26.81);Pain Pain - Right/Left: Left Pain - part of body: Ankle and joints of foot    Time: 0922-0952 PT Time Calculation (min) (ACUTE ONLY): 30 min   Charges:   PT Evaluation $PT Eval Moderate Complexity: 1 Mod PT Treatments $Therapeutic Exercise: 8-22 mins   PT G CodesTresa Endo PT [phone]  Claretha Cooper 12/07/2017, 1:56 PM

## 2017-12-07 NOTE — Progress Notes (Signed)
Pharmacy Antibiotic Note  Christian Lamb is a 42 y.o. male presented to the ED on 12/04/2017 with large areas of broken skin on hip and buttocks and c/o diarrhea and SOB.  To start broad abx with vancomycin and zosyn for suspected sepsis.  Today, 12/07/17  Afebrile  SCr stable  WBC 35 continues to rise  Plan: Day 3 full antibiotics  Continue current Zosyn dosing  _______________________________  Temp (24hrs), Avg:99.3 F (37.4 C), Min:98.3 F (36.8 C), Max:99.9 F (37.7 C)  Recent Labs  Lab 12/04/17 1629 12/04/17 1638 12/04/17 1820 12/05/17 0330 12/06/17 0323 12/07/17 0317  WBC 25.7*  --   --  24.9* 32.1* 35.0*  CREATININE  --   --  1.56* 1.29* 1.12 0.86  LATICACIDVEN  --  1.77  --   --   --   --     Estimated Creatinine Clearance: 87 mL/min (by C-G formula based on SCr of 0.86 mg/dL).    No Known Allergies   Thank you for allowing pharmacy to be a part of this patient's care.   Adrian Saran, PharmD, BCPS Pager [phone] 12/07/2017 10:09 AM

## 2017-12-07 NOTE — Progress Notes (Signed)
Pharmacy Antibiotic Note  Christian Lamb is a 42 y.o. male presented to the ED on 12/04/2017 with large areas of broken skin on hip and buttocks and c/o diarrhea and SOB.  To start broad abx with vancomycin and zosyn for suspected sepsis.  Today, 12/07/17  Afebrile  SCr stable  WBC 35 continues to rise  Plan: Day 3 full antibiotics  Continue current Zosyn dosing  Restarting vancomycin today 3/3 - start vancomycin 1229m IV q24 - goal AUC 400-500  _______________________________  Temp (24hrs), Avg:99.3 F (37.4 C), Min:98.3 F (36.8 C), Max:99.9 F (37.7 C)  Recent Labs  Lab 12/04/17 1629 12/04/17 1638 12/04/17 1820 12/05/17 0330 12/06/17 0323 12/07/17 0317  WBC 25.7*  --   --  24.9* 32.1* 35.0*  CREATININE  --   --  1.56* 1.29* 1.12 0.86  LATICACIDVEN  --  1.77  --   --   --   --     Estimated Creatinine Clearance: 87 mL/min (by C-G formula based on SCr of 0.86 mg/dL).    No Known Allergies   Thank you for allowing pharmacy to be a part of this patient's care.   JAdrian Saran PharmD, BCPS Pager [phone]/12/2017 2:01 PM

## 2017-12-07 NOTE — Plan of Care (Signed)
Patient transferred to [place] from ICU, VSS.  Patient has condom cath in place as well as Flexiseal.  Denies any pain, states his feet do not hurt but he cannot bear weight on them.  Patient with good po intake.

## 2017-12-07 NOTE — Progress Notes (Addendum)
PROGRESS NOTE  Christian Lamb QAS:[id number] DOB: Mar 31, 1976 DOA: 12/04/2017    PCP: [name]  Brief HPI/Subjective: 42 y.o. 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 Pt reported on admission that he lives in a house that is not clean, no acces to clean water and foot, no heating. He has also reported feet were cold and turning dark in color. His caseworker came to see him on the day of the admission and called EMS for pt to be admitted to the hospital.  Assessment/Plan: Sepsis present on admission - sepsis criteria met on admission, T 102.69F, WBC 25 K - source of infection ? UTI, GI source, L hip and buttock area open wound, also bilateral ? Necrotic toes - so far blood cultures with no growth, urine culture with multiple species present, C. Diff neg, GI stool panel negative  - pt has been on Zosyn but WBC is trending up - I have consulted with ortho team to look at the toes and see if amputation is needed, right foot toes look worse this AM - pt has had doppler done which ruled out DVTs, ABI's have been ordered, still pending - since pt still with T 99.9 F and worsening WBC, will add empiric Vanc for now until pt seen by ortho and pending further rec's  AK injury and CKD3, rhabdomyolysis - Cr is now WNL  - CK level also trending down   Elevated LFT - Drug screen negative, alcohol < 10 on presentation - LFT elevation likely from sepsis and rhabdomyolysis - hep panel and HIV negative  - LFT's overall trending down - CMP in AM  UC with diarrhea - stool panel negative - C. Diff negative  - continue budesonide - appreciate GI team assistance  - per GI team, continue cholestyramine 4 gm TID, Lomotil TID   Tachycardia - review of  records indicate this is somewhat chronic problem - will keep on tele for 24 hours and d/c in AM if no active cardiac issues  - once BP able to tolerate, can try low dose beta blocker   Hypokalemia - on low end of normal, will continue to supplement, keep K ~4 - BMP in AM  Metabolic acidosis - suspect from diarrhea and dehydration - on bicarb supplements - overall stable - BMP in AM  Anemia  - suspect from chronic illness, UC - no evidence of active bleeding - CBC in AM  Lower extremity edema and discoloration of toes  - ABI have been ordered previously but ? If this is related to frost bites - ortho consulted for assistance, ? If amputation needed  Skin wounds - Moisture associated skin damage to left hip and buttocks from excessive diarrhea and exposure to urine and stool during acute illness, present on admission.  - Left hip:  22 cm x 3 cm x 0.2 cm  - Buttocks:  Both gluteal areas affected:  Each measures:  8 cm x 6 cm x 0.2 cm  - WOC recommendations: Cleanse buttocks and left hip wounds with NS and pat dry.  Apply Gerhardts butt paste twice daily and PRN soilage. No disposable briefs or underpads while in bed.    Severe PCM, underweight, hypoalbuminemia  - appreciate nutritionist recommendations  - Body mass index is 16.27 kg/m  Schizophrenia/schizoaffective disorder - stable - continue Benztropine  Tobacco abuse - continue Nicotine patch   Hiccups - placed on trazodone and this is now resolved   Code Status: full DVT prophylaxis: Lovenox SQ Family Communication: pt at bedside  Disposition Plan: transfer to tele due to tachycardia   Consultants:  [facility]  Ortho   Procedures:  Rectal tube insertion  Antibiotics:  Zosyn 02/28 -->  Vancomycin 03/03 -->  Objective: BP 118/63   Pulse (!) 116   Temp 98.3 F (36.8 C) (Oral)   Resp 17   Ht 6' (1.829 m) Comment: per patient  Wt 54.4 kg (120 lb)   SpO2 100%   BMI 16.27 kg/m   Intake/Output  Summary (Last 24 hours) at 12/07/2017 1329 Last data filed at 12/07/2017 1100 Gross per 24 hour  Intake 3270 ml  Output 3550 ml  Net -280 ml   Filed Weights   12/04/17 2107  Weight: 54.4 kg (120 lb)   Physical Exam  Constitutional: Appears calm, NAD CVS: RRR, S1/S2 +, no murmurs, no gallops, no carotid bruit.  Pulmonary: Effort and breath sounds normal, no stridor, rhonchi, wheezes, rales.  Abdominal: Soft. BS +,  no distension, tenderness, rebound or guarding.  Musculoskeletal: right foot toes look necrotic   Data Reviewed: Basic Metabolic Panel: Recent Labs  Lab 12/04/17 1820 12/05/17 0330 12/06/17 0323 12/07/17 0317  NA 140 143 138 138  K 5.2* 4.2 3.4* 3.6  CL 110 114* 110 111  CO2 19* 21* 22 21*  GLUCOSE 90 71 100* 92  BUN 56* 35* 17 15  CREATININE 1.56* 1.29* 1.12 0.86  CALCIUM 7.5* 7.3* 7.6* 7.5*  MG 2.4  --   --   --   PHOS 4.1  --   --   --    Liver Function Tests: Recent Labs  Lab 12/04/17 1820 12/05/17 0330 12/06/17 0323 12/07/17 0317  AST 164* 176* 133* 97*  ALT 88* 80* 73* 70*  ALKPHOS 84 70 72 75  BILITOT 1.6* 1.7* 1.4* 0.9  PROT 7.4 6.9 6.3* 6.2*  ALBUMIN 2.0* 1.7* 1.4* 1.3*   Recent Labs  Lab 12/04/17 1820  LIPASE 21   CBC: Recent Labs  Lab 12/04/17 1629 12/05/17 0330 12/06/17 0323 12/07/17 0317  WBC 25.7* 24.9* 32.1* 35.0*  NEUTROABS 22.1*  --   --   --   HGB 11.0* 8.9* 7.8* 7.6*  HCT 33.4* 27.8* 24.5* 23.8*  MCV 86.5 89.1 87.2 90.5  PLT 354 266 329 338   Cardiac Enzymes:   Recent Labs  Lab 12/04/17 1953 12/06/17 0323 12/07/17 0317  CKTOTAL 6,022* 4,589* 3,020*  CKMB 50.2*  --   --    Recent Results (from the past 240 hour(s))  Blood Culture (routine x 2)     Status: None (Preliminary result)   Collection Time: 12/04/17  6:20 PM  Result Value Ref Range Status   Specimen Description   Final    BLOOD LEFT ARM Performed at [facility], [place] [address], [place], [place] [address]    Special Requests    Final    IN PEDIATRIC BOTTLE Blood Culture adequate volume Performed at [facility] [address] [place], [place] [address]    Culture   Final    NO GROWTH 2 DAYS Performed at [facility] [address], [place], [place] [address]    Report Status PENDING  Incomplete  Blood Culture (routine x 2)     Status: None (Preliminary result)   Collection Time: 12/04/17  6:46 PM  Result Value Ref Range Status   Specimen Description   Final    BLOOD RIGHT ANTECUBITAL Performed at [facility] [address] [place], [place] [address]    Special Requests   Final    BOTTLES DRAWN AEROBIC AND ANAEROBIC Blood Culture adequate volume Performed at [facility] [address] [place], [place] [address]    Culture   Final    NO GROWTH 2 DAYS Performed at [facility] [address], [place], [place] [address]    Report Status PENDING  Incomplete  C difficile quick scan w PCR reflex     Status: None   Collection Time: 12/04/17  7:16 PM  Result Value Ref Range Status   C Diff antigen NEGATIVE NEGATIVE Final   C Diff toxin NEGATIVE NEGATIVE Final   C Diff interpretation No C. difficile detected.  Final    Comment: Performed at [facility], [place] [address], [place], [place] [address]  Gastrointestinal Panel by PCR , Stool     Status: None   Collection Time: 12/04/17  7:16 PM  Result Value Ref Range Status   Campylobacter species NOT DETECTED NOT DETECTED Final   Plesimonas shigelloides NOT DETECTED NOT DETECTED Final   Salmonella species NOT DETECTED NOT DETECTED Final   Yersinia enterocolitica NOT DETECTED NOT DETECTED Final   Vibrio species NOT DETECTED NOT DETECTED Final   Vibrio cholerae NOT DETECTED NOT DETECTED Final   Enteroaggregative E coli (EAEC) NOT DETECTED NOT DETECTED Final   Enteropathogenic E coli (EPEC) NOT DETECTED NOT DETECTED Final   Enterotoxigenic E coli (ETEC) NOT DETECTED  NOT DETECTED Final   Shiga like toxin producing E coli (STEC) NOT DETECTED NOT DETECTED Final   Shigella/Enteroinvasive E coli (EIEC) NOT DETECTED NOT DETECTED Final   Cryptosporidium NOT DETECTED NOT DETECTED Final   Cyclospora cayetanensis NOT DETECTED NOT DETECTED Final   Entamoeba histolytica NOT DETECTED NOT DETECTED Final   Giardia lamblia NOT DETECTED NOT DETECTED Final   Adenovirus F40/41 NOT DETECTED NOT DETECTED Final   Astrovirus NOT DETECTED NOT DETECTED Final   Norovirus GI/GII NOT DETECTED NOT DETECTED Final   Rotavirus A NOT DETECTED NOT DETECTED Final   Sapovirus (I, II, IV, and V) NOT DETECTED NOT DETECTED Final    Comment: Performed at [facility], [place] [place], [place] [address]  MRSA PCR Screening     Status: None   Collection Time: 12/04/17 11:29 PM  Result Value Ref Range Status   MRSA by PCR NEGATIVE NEGATIVE Final    Comment:        The GeneXpert MRSA Assay (FDA approved for NASAL specimens only), is one component of a comprehensive MRSA colonization surveillance program. It is not intended to diagnose MRSA infection nor to guide or monitor treatment for MRSA infections. Performed at [facility], [place] [address] [place], [place] [address]   Culture, Urine     Status: Abnormal   Collection Time: 12/05/17  9:43 AM  Result Value Ref Range Status   Specimen Description   Final    URINE, CLEAN CATCH Performed at [facility], [place] [address], [place], [place] [address]    Special Requests   Final    NONE Performed at [facility], [place] [address], [place], [place] [address]    Culture MULTIPLE SPECIES PRESENT, SUGGEST RECOLLECTION (A)  Final   Report Status 12/06/2017 FINAL  Final  Studies: No results found.  Scheduled Meds: . benztropine  0.5 mg Oral QHS  . budesonide  9 mg Oral Daily  . cholestyramine  4 g Oral 3 times per day  . diphenoxylate-atropine  1 tablet Oral  TID  . enoxaparin (LOVENOX) injection  40 mg Subcutaneous Q24H  . feeding supplement (ENSURE ENLIVE)  237 mL Oral Q24H  . Gerhardt's butt cream   Topical BID  . mesalamine  4 g Rectal QHS  . nicotine  21 mg Transdermal Q24H  . sodium bicarbonate  650 mg Oral Daily   Continuous Infusions: . sodium chloride 1,000 mL (12/07/17 1140)  . piperacillin-tazobactam (ZOSYN)  IV Stopped (12/07/17 5638)   Time spent: 35 minutes with > 50% of time discussing current diagnostic test results, clinical impression and plan of care.  Faye Ramsay MD [facility] Pager [phone] If 7PM-7AM, please contact night-coverage at [email or web address], password [facility]  12/07/2017, 1:29 PM  LOS: 3 days

## 2017-12-08 ENCOUNTER — Inpatient Hospital Stay (HOSPITAL_COMMUNITY): Payer: Medicaid Other

## 2017-12-08 DIAGNOSIS — R609 Edema, unspecified: Secondary | ICD-10-CM

## 2017-12-08 DIAGNOSIS — K91850 Pouchitis: Secondary | ICD-10-CM

## 2017-12-08 LAB — COMPREHENSIVE METABOLIC PANEL
ALK PHOS: 61 U/L (ref 38–126)
ALT: 69 U/L — ABNORMAL HIGH (ref 17–63)
AST: 78 U/L — ABNORMAL HIGH (ref 15–41)
Albumin: 1.4 g/dL — ABNORMAL LOW (ref 3.5–5.0)
Anion gap: 5 (ref 5–15)
BILIRUBIN TOTAL: 0.7 mg/dL (ref 0.3–1.2)
BUN: 11 mg/dL (ref 6–20)
CALCIUM: 7.6 mg/dL — AB (ref 8.9–10.3)
CO2: 23 mmol/L (ref 22–32)
CREATININE: 0.89 mg/dL (ref 0.61–1.24)
Chloride: 112 mmol/L — ABNORMAL HIGH (ref 101–111)
GFR calc Af Amer: >60 mL/min (ref >60)
Glucose, Bld: 85 mg/dL (ref 65–99)
Potassium: 3.8 mmol/L (ref 3.5–5.1)
Sodium: 140 mmol/L (ref 135–145)
Total Protein: 6.2 g/dL — ABNORMAL LOW (ref 6.5–8.1)

## 2017-12-08 LAB — CBC
HCT: 25.8 % — ABNORMAL LOW (ref 39.0–52.0)
Hemoglobin: 8 g/dL — ABNORMAL LOW (ref 13.0–17.0)
MCH: 28.1 pg (ref 26.0–34.0)
MCHC: 31 g/dL (ref 30.0–36.0)
MCV: 90.5 fL (ref 78.0–100.0)
PLATELETS: 418 10*3/uL — AB (ref 150–400)
RBC: 2.85 MIL/uL — AB (ref 4.22–5.81)
RDW: 14.5 % (ref 11.5–15.5)
WBC: 26.3 10*3/uL — AB (ref 4.0–10.5)

## 2017-12-08 LAB — CK: Total CK: 2194 U/L — ABNORMAL HIGH (ref 49–397)

## 2017-12-08 MED ORDER — THIAMINE HCL 100 MG/ML IJ SOLN
100.0000 mg | Freq: Every day | INTRAMUSCULAR | Status: AC
  Administered 2017-12-08 – 2017-12-10 (×3): 100 mg via INTRAVENOUS
  Filled 2017-12-08 (×3): qty 2

## 2017-12-08 MED ORDER — METOPROLOL TARTRATE 25 MG PO TABS
12.5000 mg | ORAL_TABLET | Freq: Two times a day (BID) | ORAL | Status: DC
  Administered 2017-12-08 (×2): 12.5 mg via ORAL
  Filled 2017-12-08 (×2): qty 1

## 2017-12-08 MED ORDER — COSYNTROPIN 0.25 MG IJ SOLR
0.2500 mg | Freq: Once | INTRAMUSCULAR | Status: AC
  Administered 2017-12-09: 0.25 mg via INTRAVENOUS
  Filled 2017-12-08 (×2): qty 0.25

## 2017-12-08 MED ORDER — PROSIGHT PO TABS
1.0000 | ORAL_TABLET | Freq: Every day | ORAL | Status: DC
  Administered 2017-12-08 – 2017-12-18 (×11): 1 via ORAL
  Filled 2017-12-08 (×11): qty 1

## 2017-12-08 MED ORDER — OXYCODONE HCL 5 MG PO TABS
5.0000 mg | ORAL_TABLET | ORAL | Status: DC | PRN
  Administered 2017-12-08 – 2017-12-09 (×2): 5 mg via ORAL
  Administered 2017-12-09 – 2017-12-11 (×3): 10 mg via ORAL
  Filled 2017-12-08: qty 2
  Filled 2017-12-08 (×2): qty 1
  Filled 2017-12-08 (×2): qty 2

## 2017-12-08 MED ORDER — LORAZEPAM 0.5 MG PO TABS
0.5000 mg | ORAL_TABLET | ORAL | Status: DC | PRN
  Administered 2017-12-08 – 2017-12-15 (×12): 0.5 mg via ORAL
  Filled 2017-12-08 (×14): qty 1

## 2017-12-08 MED ORDER — FOLIC ACID 1 MG PO TABS
1.0000 mg | ORAL_TABLET | Freq: Every day | ORAL | Status: DC
  Administered 2017-12-08 – 2017-12-18 (×11): 1 mg via ORAL
  Filled 2017-12-08 (×11): qty 1

## 2017-12-08 MED ORDER — LORAZEPAM 0.5 MG PO TABS
0.5000 mg | ORAL_TABLET | Freq: Four times a day (QID) | ORAL | Status: DC | PRN
Start: 2017-12-08 — End: 2017-12-08
  Administered 2017-12-08: 0.5 mg via ORAL
  Filled 2017-12-08: qty 1

## 2017-12-08 MED ORDER — KCL IN DEXTROSE-NACL 20-5-0.45 MEQ/L-%-% IV SOLN
INTRAVENOUS | Status: AC
  Administered 2017-12-08 – 2017-12-09 (×2): via INTRAVENOUS
  Filled 2017-12-08 (×2): qty 1000

## 2017-12-08 NOTE — Clinical Social Work Note (Signed)
Clinical Social Work Assessment  Patient Details  Name: Christian Lamb MRN: [id number] Date of Birth: 06/08/1976  Date of referral:  12/08/17               Reason for consult:  Facility Placement                Permission sought to share information with:  Family Supports, Case Manager Permission granted to share information::  Yes, Verbal Permission Granted  Name::     mother Izora Gala [phone]  Agency::  [facility] Team- [phone]  Relationship::     Contact Information:     Housing/Transportation Living arrangements for the past 2 months:  Apartment, [place] of Information:  Patient, Case Manager Patient Interpreter Needed:  None Criminal Activity/Legal Involvement Pertinent to Current Situation/Hospitalization:  No - Comment as needed Significant Relationships:  Siblings, Parents Lives with:  Roommate Do you feel safe going back to the place where you live?  No Need for family participation in patient care:  Yes (Comment)(mother is payee and assists pt with his affairs)  Care giving concerns:  Pt admitted from home- he reports he has been living in a "boarding house but not an official one- I just pay rent to some roommate." Did not describe how long he has lived there, states "More than a few weeks I think." Prior to that was living with his sister but moved out due to relational issues with sister's boyfriend. States the house "didn't have heat for a long time"- per chart pt has frostbite to his toes. - per orthopedic consult -"Frostbite should be allowed to demarcate prior to recommendation for surgical debridement/amputation"  Pt states, "I get diarrhea real bad at night for a few weeks- I was up to the bathroom the whole night." History of chronic diarrhea, ulcerative colitis, previous colectomy with ileal pouch/anal anastomosis  Pt states he had no ambulation issues prior to "getting sick"-and "I need to start getting up and moving sooner  rather than later- yesterday I was very weak. I've never had problems like this before my toes."  Pt reports he has schizophrenia (consistent with chart) and was treated inpatient at [facility] 2017. States since then he has felt stable and goes to [place] for monthly Invega sustenna injection. Reports he has a case worker with the employment services program.    Social Worker assessment / plan:  CSW cpnsulted to assess SNF placement. PT recommended SNF for pt based on 12/07/17 evaluation- see note.  Met with pt at bedside to address this and other concerns- pt alert and oriented x 4. Pt does have schizophrenia dx- states he received his invega shot 3 weeks ago (monthly IM). Other than having difficulty with timeline of this illness and his change in housing events, pt's thought content was linear and speech organized. Good eye contact and attention. Pt adamant that he does not want to go to a facility. States that his mother and case worker have identified a "new apartment for me and have talked about getting caregivers to come out and help me with bathing and taking care of myself." Pt provided CSW permission to speak with both parties re: his plan. Left voicemails for both. (Did speak and Lauren at [facility] outpatient clinic- she confirmed pt received IM Invega last month and is a client of the employment services team- put CSW in touch with caseworker Netta Neat at number above- left voicemail)  Plan: TBD- pt denying he will consent to  any type of facility placement but CSW awaiting contact with his mother (also his POA and payee he reports) and [facility] case worker in order to verify his needs can be coordinated.  Facility placement may be complicated given pt's psychiatric hx and Medicaid insurance should pt decide he would like to pursue SNF. Will continue gathering information and follow up with planning.    Employment status:  Unemployed Forensic scientist:  Medicaid In [place]  Recommendations:  [place] / Referral to community resources:     Patient/Family's Response to care:  Pt very appreciative of care.  Patient/Family's Understanding of and Emotional Response to Diagnosis, Current Treatment, and Prognosis:  Pt demonstrates some understanding of his treatment and plan- states "he should've come to the hospital earlier- I finally came when my caseworker called EMS." is clear that he has been treated with antibiotics and states clearly that he thinks his living conditions in the "boarding house" contributed to his worsening diarrhea.  Emotionally pt expressed feeling positive about "feeling better" and "when I leave the hospital I have a new place to live." Did become upset when CSW described what SNF process entailed but was reasonable and adaptable when CSW assured him that his care needs and their ability to be managed in the appropriate setting will be discussed with his mother and Beverly Sessions case worker as well.   Emotional Assessment Appearance:  Appears stated age Attitude/Demeanor/Rapport:  Engaged Affect (typically observed):  Accepting, Calm Orientation:  Oriented to Self, Oriented to Place, Oriented to  Time, Oriented to Situation Alcohol / Substance use:  Not Applicable Psych involvement (Current and /or in the community):  No (Comment)  Discharge Needs  Concerns to be addressed:  Discharge Planning Concerns, Care Coordination Readmission within the last 30 days:  No Current discharge risk:  (still assessing) Barriers to Discharge:  Continued Medical Work up   [employer], LCSW 12/08/2017, 5:10 PM  [phone]

## 2017-12-08 NOTE — Progress Notes (Addendum)
    Progress Note   Subjective  Chief Complaint: Ulcerative colitis/diarrhea  Denies abdominal pain, feeling better, decreased output today.  Tells me he has been seen by vascular surgery.   Objective   Vital signs in last 24 hours: Temp:  [98.4 F (36.9 C)-100.5 F (38.1 C)] 98.8 F (37.1 C) (03/04 0447) Pulse Rate:  [96-116] 116 (03/04 0447) Resp:  [10-19] 19 (03/04 0447) BP: (107-125)/(56-77) 114/71 (03/04 0447) SpO2:  [95 %-100 %] 100 % (03/04 0447) Last BM Date: 12/08/17 General:    AA male in NAD Heart:  Regular rate and rhythm; no murmurs Lungs: Respirations even and unlabored, lungs CTA bilaterally Abdomen:  Soft, nontender and nondistended. Normal bowel sounds. Extremities: Black toes on both feet, worse on right Neurologic:  Alert and oriented,  grossly normal neurologically. Psych:  Cooperative. Normal mood and affect.  Intake/Output from previous day: 03/03 0701 - 03/04 0700 In: 4135.4 [P.O.:1440; I.V.:2285.4; IV Piggyback:350] Out: 9242 [Urine:3750; Stool:825] Intake/Output this shift: Total I/O In: 170 [P.O.:120; IV Piggyback:50] Out: -   Lab Results: Recent Labs    12/06/17 0323 12/07/17 0317 12/08/17 0445  WBC 32.1* 35.0* 26.3*  HGB 7.8* 7.6* 8.0*  HCT 24.5* 23.8* 25.8*  PLT 329 338 418*   BMET Recent Labs    12/06/17 0323 12/07/17 0317 12/08/17 0445  NA 138 138 140  K 3.4* 3.6 3.8  CL 110 111 112*  CO2 22 21* 23  GLUCOSE 100* 92 85  BUN 17 15 11   CREATININE 1.12 0.86 0.89  CALCIUM 7.6* 7.5* 7.6*   LFT Recent Labs    12/08/17 0445  PROT 6.2*  ALBUMIN 1.4*  AST 78*  ALT 69*  ALKPHOS 61  BILITOT 0.7     Assessment / Plan:   Assessment: 1.  Ulcerative colitis status post colectomy with J-pouch: Inflamed and ulcerated neoterminal ileum and ileal anal pouch suspicious for Crohn's found on endoscopic eval in June, on budesonide, stools have slowed now on cholestyramine and Lomotil started 2 days ago 2.  Sepsis: Thought secondary  to necrotic toes at this point  Plan: 1.  Continue cholestyramine and Lomotil 2.  Appreciate vascular consultation 3.  Please await any further recommendations from Dr. Silverio Decamp today.  Thank you for your kind consultation, we will continue to follow.    LOS: 4 days   Levin Erp  12/08/2017, 11:43 AM  Pager # [phone]   Attending physician's note   I have taken an interval history, reviewed the chart and examined the patient. I agree with the Advanced Practitioner's note, impression and recommendations.   Stool output has significantly improved from more than 1600 cc/day to 800 cc/day.  Which is within normal limits given his status post total colectomy with J-pouch. Continue cholestyramine, Lomotil and budesonide. Start probiotic VS L # 3 900,billion units twice daily for pouchitis on discharge, not sure if it is available on inpatient formulary. We will arrange for outpatient follow-up in GI office after discharge Please call with any change in clinical status, and available for any questions or concerns.  Damaris Hippo, MD [phone] Mon-Fri 8a-5p [phone] after 5p, weekends, holidays

## 2017-12-08 NOTE — Progress Notes (Signed)
ABI's have been completed. Right 0.98 Left 1.00  12/08/17 8:52 AM Christian Lamb RVT

## 2017-12-08 NOTE — Progress Notes (Signed)
[facility] PROGRESS NOTE  Christian Lamb DPO:[id number] DOB: January 11, 1976 DOA: 12/04/2017 PCP: [name]  Brief summary   42 y.o.malewith medical history significant ofchronic diarrhea, ulcerative colitis/chronic ulcerative enterocolitis s/p total colectomy with ileal pouch-anal anastomosis, anemia, schizophrenia,andchronic tachycardia, who presented with diarrhea andshortness of breath. Pt reported on admission that he lives in a house that is not clean, no acces to clean water and foot, no heating. He has also reported feet were cold and turning dark in color. His caseworker came to see him on the day of the admission and called EMS for pt to be admitted to the hospital.   Assessment/Plan:  Sepsis present on admission. sepsis criteria met on admission, T 102.55F, WBC 25 K. source of infection possible UTI, GI source, L hip and buttock area open wound, also bilateral ? Necrotic toes. blood cultures with no growth, urine culture with multiple species present, C. Diff neg, GI stool panel negative  -still febrile. cont iv Zosyn but WBC is trending up,   BL lower extremity, frostbite the his toes. Necrotic right second toe.  pt has had doppler done which ruled out DVTs. No evidence of osteomyelitis on x ray. We will cont empiric iv antibiotics,. Orthopedics is following, allowing to demarcate prior to further surgical recommendations. . Appreciate the input   AK injury and CKD3, rhabdomyolysis. Cr is now WNL. CK level also trending down   Elevated LFT. Drug screen negative, alcohol < 10 on presentation. LFT elevation likely from sepsis and rhabdomyolysis. hep panel and HIV negative. LFT's overall trending down  UC with diarrhea. stool panel negative. C. Diff negative. continue budesonide. appreciate GI team assistance. per GI team, continue cholestyramine 4 gm TID, Lomotil TID   Tachycardia. review of records indicate this is somewhat chronic problem.  tsh-1.5. Will try low dose bb.   Hypokalemia. on low end of normal, will continue to supplement, keep K ~4  Metabolic acidosis. suspect from diarrhea and dehydration. on bicarb supplements. overall stable  Anemia. suspect from chronic illness, UC. no evidence of active bleeding.  Skin wounds. Moisture associated skin damage to left hip and buttocks from excessive diarrhea and exposure to urine and stool during acute illness, present on admission.  - Left hip: 22 cm x 3 cm x 0.2 cm. Buttocks: Both gluteal areas affected: Each measures: 8 cm x 6 cm x 0.2 cm. WOC recommendations: Cleanse buttocks and left hip wounds with NS and pat dry. Apply Gerhardts butt paste twice daily and PRN soilage. No disposable briefs or underpads while in bed.   Severe PCM, underweight, hypoalbuminemia. appreciate nutritionist recommendations. Body mass index is 16.27 kg/m. Start ensure, vitamins. Check acth stim test   Schizophrenia/schizoaffective disorder. Stable. continue Benztropine   Tobacco abuse. continue Nicotine patch     Code Status: full Family Communication: d/w patient, RN (indicate person spoken with, relationship, and if by phone, the number) Disposition Plan: needs SNF. Not medically ready yet   Consultants:  [facility]  Ortho   Procedures:  Rectal tube insertion  Antibiotics:  Zosyn 02/28 -->  Vancomycin 03/03 -->   HPI/Subjective: Alert. Reports feeling somewhat better. Still mil d tachycardia   Objective: Vitals:   12/07/17 2019 12/08/17 0447  BP: 113/60 114/71  Pulse: 99 (!) 116  Resp: 17 19  Temp: 99 F (37.2 C) 98.8 F (37.1 C)  SpO2: 95% 100%    Intake/Output Summary (Last 24 hours) at 12/08/2017 0933 Last data filed at 12/08/2017 0600 Gross per 24  hour  Intake 3837.5 ml  Output 4575 ml  Net -737.5 ml   Filed Weights   12/04/17 2107  Weight: 54.4 kg (120 lb)    Exam:   General:  No distress   Cardiovascular: s1,s2 tachycardia    Respiratory: no wheezing   Abdomen: soft, nt, nd   Musculoskeletal: mild pedal edema. bl lower extremity toe frostbite    Data Reviewed: Basic Metabolic Panel: Recent Labs  Lab 12/04/17 1820 12/05/17 0330 12/06/17 0323 12/07/17 0317 12/08/17 0445  NA 140 143 138 138 140  K 5.2* 4.2 3.4* 3.6 3.8  CL 110 114* 110 111 112*  CO2 19* 21* 22 21* 23  GLUCOSE 90 71 100* 92 85  BUN 56* 35* 17 15 11   CREATININE 1.56* 1.29* 1.12 0.86 0.89  CALCIUM 7.5* 7.3* 7.6* 7.5* 7.6*  MG 2.4  --   --   --   --   PHOS 4.1  --   --   --   --    Liver Function Tests: Recent Labs  Lab 12/04/17 1820 12/05/17 0330 12/06/17 0323 12/07/17 0317 12/08/17 0445  AST 164* 176* 133* 97* 78*  ALT 88* 80* 73* 70* 69*  ALKPHOS 84 70 72 75 61  BILITOT 1.6* 1.7* 1.4* 0.9 0.7  PROT 7.4 6.9 6.3* 6.2* 6.2*  ALBUMIN 2.0* 1.7* 1.4* 1.3* 1.4*   Recent Labs  Lab 12/04/17 1820  LIPASE 21   No results for input(s): AMMONIA in the last 168 hours. CBC: Recent Labs  Lab 12/04/17 1629 12/05/17 0330 12/06/17 0323 12/07/17 0317 12/08/17 0445  WBC 25.7* 24.9* 32.1* 35.0* 26.3*  NEUTROABS 22.1*  --   --   --   --   HGB 11.0* 8.9* 7.8* 7.6* 8.0*  HCT 33.4* 27.8* 24.5* 23.8* 25.8*  MCV 86.5 89.1 87.2 90.5 90.5  PLT 354 266 329 338 418*   Cardiac Enzymes: Recent Labs  Lab 12/04/17 1953 12/06/17 0323 12/07/17 0317 12/08/17 0445  CKTOTAL 6,022* 4,589* 3,020* 2,194*  CKMB 50.2*  --   --   --    BNP (last 3 results) No results for input(s): BNP in the last 8760 hours.  ProBNP (last 3 results) No results for input(s): PROBNP in the last 8760 hours.  CBG: No results for input(s): GLUCAP in the last 168 hours.  Recent Results (from the past 240 hour(s))  Blood Culture (routine x 2)     Status: None (Preliminary result)   Collection Time: 12/04/17  6:20 PM  Result Value Ref Range Status   Specimen Description   Final    BLOOD LEFT ARM Performed at [facility] [address], [place], [place] [address]    Special Requests   Final    IN PEDIATRIC BOTTLE Blood Culture adequate volume Performed at [facility] [address] [place], [place] [address]    Culture   Final    NO GROWTH 3 DAYS Performed at [facility], [place] [address] [place], [place] [address]    Report Status PENDING  Incomplete  Blood Culture (routine x 2)     Status: None (Preliminary result)   Collection Time: 12/04/17  6:46 PM  Result Value Ref Range Status   Specimen Description   Final    BLOOD RIGHT ANTECUBITAL Performed at [facility] [address] [place],  [address]    Special Requests   Final    BOTTLES DRAWN AEROBIC AND ANAEROBIC Blood Culture  adequate volume Performed at [facility] [address] [place], [place] [address]    Culture   Final    NO GROWTH 3 DAYS Performed at [facility], [place] [address], [place], [place] [address]    Report Status PENDING  Incomplete  C difficile quick scan w PCR reflex     Status: None   Collection Time: 12/04/17  7:16 PM  Result Value Ref Range Status   C Diff antigen NEGATIVE NEGATIVE Final   C Diff toxin NEGATIVE NEGATIVE Final   C Diff interpretation No C. difficile detected.  Final    Comment: Performed at [facility], [place] [address], [place], [place] [address]  Gastrointestinal Panel by PCR , Stool     Status: None   Collection Time: 12/04/17  7:16 PM  Result Value Ref Range Status   Campylobacter species NOT DETECTED NOT DETECTED Final   Plesimonas shigelloides NOT DETECTED NOT DETECTED Final   Salmonella species NOT DETECTED NOT DETECTED Final   Yersinia enterocolitica NOT DETECTED NOT DETECTED Final   Vibrio species NOT DETECTED NOT DETECTED Final   Vibrio cholerae NOT DETECTED NOT DETECTED Final   Enteroaggregative E coli (EAEC) NOT DETECTED NOT DETECTED Final   Enteropathogenic E coli (EPEC) NOT DETECTED NOT DETECTED  Final   Enterotoxigenic E coli (ETEC) NOT DETECTED NOT DETECTED Final   Shiga like toxin producing E coli (STEC) NOT DETECTED NOT DETECTED Final   Shigella/Enteroinvasive E coli (EIEC) NOT DETECTED NOT DETECTED Final   Cryptosporidium NOT DETECTED NOT DETECTED Final   Cyclospora cayetanensis NOT DETECTED NOT DETECTED Final   Entamoeba histolytica NOT DETECTED NOT DETECTED Final   Giardia lamblia NOT DETECTED NOT DETECTED Final   Adenovirus F40/41 NOT DETECTED NOT DETECTED Final   Astrovirus NOT DETECTED NOT DETECTED Final   Norovirus GI/GII NOT DETECTED NOT DETECTED Final   Rotavirus A NOT DETECTED NOT DETECTED Final   Sapovirus (I, II, IV, and V) NOT DETECTED NOT DETECTED Final    Comment: Performed at [facility], [place] [place], [place] [address]  MRSA PCR Screening     Status: None   Collection Time: 12/04/17 11:29 PM  Result Value Ref Range Status   MRSA by PCR NEGATIVE NEGATIVE Final    Comment:        The GeneXpert MRSA Assay (FDA approved for NASAL specimens only), is one component of a comprehensive MRSA colonization surveillance program. It is not intended to diagnose MRSA infection nor to guide or monitor treatment for MRSA infections. Performed at [facility], [place] [address] [place], [place] [address]   Culture, Urine     Status: Abnormal   Collection Time: 12/05/17  9:43 AM  Result Value Ref Range Status   Specimen Description   Final    URINE, CLEAN CATCH Performed at [facility], [place] [address] [place], [place] [address]    Special Requests   Final    NONE Performed at [facility], [place] [address], [place],  [address]    Culture MULTIPLE SPECIES PRESENT, SUGGEST RECOLLECTION (A)  Final   Report Status 12/06/2017 FINAL  Final     Studies: No results found.  Scheduled Meds: . benztropine  0.5 mg Oral QHS  . budesonide  9 mg Oral Daily  . cholestyramine  4 g Oral 3  times per day  . diphenoxylate-atropine  1 tablet Oral TID  . enoxaparin (LOVENOX) injection  40 mg Subcutaneous Q24H  . feeding supplement (ENSURE ENLIVE)  237 mL Oral Q24H  . Gerhardt's butt cream   Topical BID  . mesalamine  4 g Rectal QHS  . nicotine  21 mg Transdermal Q24H  . sodium bicarbonate  650 mg Oral Daily   Continuous Infusions: . sodium chloride 75 mL/hr at 12/07/17 2123  . piperacillin-tazobactam (ZOSYN)  IV 3.375 g (12/08/17 0628)  . vancomycin Stopped (12/07/17 1640)    Principal Problem:   Sepsis ([facility]) Active Problems:   Schizoaffective disorder, bipolar type ([place])   Tobacco use disorder   Ulcerative colitis with complication ([facility])   Anemia of chronic disease   AKI (acute kidney injury) ([place])   Rhabdomyolysis   Elevated liver enzymes   Protein-calorie malnutrition, severe    Time spent: >35 minutes     Kinnie Feil  [facility] Pager [phone]. If 7PM-7AM, please contact night-coverage at [email or web address], password [facility] 12/08/2017, 9:33 AM  LOS: 4 days

## 2017-12-09 ENCOUNTER — Telehealth: Payer: Self-pay

## 2017-12-09 LAB — ACTH STIMULATION, 3 TIME POINTS
CORTISOL BASE: 5.9 ug/dL
Cortisol, 30 Min: 15.4 ug/dL
Cortisol, 60 Min: 16.5 ug/dL

## 2017-12-09 LAB — CULTURE, BLOOD (ROUTINE X 2)
CULTURE: NO GROWTH
Culture: NO GROWTH
SPECIAL REQUESTS: ADEQUATE
Special Requests: ADEQUATE

## 2017-12-09 LAB — CBC
HEMATOCRIT: 25 % — AB (ref 39.0–52.0)
Hemoglobin: 7.8 g/dL — ABNORMAL LOW (ref 13.0–17.0)
MCH: 28.3 pg (ref 26.0–34.0)
MCHC: 31.2 g/dL (ref 30.0–36.0)
MCV: 90.6 fL (ref 78.0–100.0)
PLATELETS: 441 10*3/uL — AB (ref 150–400)
RBC: 2.76 MIL/uL — ABNORMAL LOW (ref 4.22–5.81)
RDW: 14.6 % (ref 11.5–15.5)
WBC: 25.5 10*3/uL — ABNORMAL HIGH (ref 4.0–10.5)

## 2017-12-09 LAB — CK: CK TOTAL: 1563 U/L — AB (ref 49–397)

## 2017-12-09 MED ORDER — COSYNTROPIN 0.25 MG IJ SOLR
0.2500 mg | Freq: Once | INTRAMUSCULAR | Status: AC
  Administered 2017-12-10: 0.25 mg via INTRAVENOUS
  Filled 2017-12-09: qty 0.25

## 2017-12-09 MED ORDER — METOPROLOL TARTRATE 25 MG PO TABS
25.0000 mg | ORAL_TABLET | Freq: Two times a day (BID) | ORAL | Status: DC
  Administered 2017-12-09 – 2017-12-13 (×7): 25 mg via ORAL
  Filled 2017-12-09 (×8): qty 1

## 2017-12-09 MED ORDER — ALUM & MAG HYDROXIDE-SIMETH 200-200-20 MG/5ML PO SUSP
30.0000 mL | ORAL | Status: DC | PRN
  Administered 2017-12-09: 30 mL via ORAL
  Filled 2017-12-09: qty 30

## 2017-12-09 NOTE — Progress Notes (Signed)
   12/09/17 1038  Clinical Encounter Type  Visited With Patient;Health care provider  Visit Type Initial  Referral From Nurse;Patient  Consult/Referral To Chaplain  Spiritual Encounters  Spiritual Needs Emotional  Stress Factors  Patient Stress Factors Major life changes;Family relationships   Following up on a [facility] for prayer and someone to talk with.  Nurse indicated today is the patient's birthday and the staff sang for him.  Patient began by sharing why he had come in to the hospital and that he has had previous medical issues.  As we continued to talk he began to break down what is going on with his family.  He stated that when he was in his early 20's he felt abandoned by his mom when she and her husband moved to [place].  Seems like his mother is now dealing with guilt and trying to tell the patient how to live his life.  Patient had lived with his sister for a while but that did not work bc of her boyfriend.  Patient indicated he has a place to go after he lives here.  His family relationship seems to be a major stress factor for him.  He does not seem to believe that things will change with his mom.  I encouraged him to let her know how he feels and what he needs from her.  Will follow up tomorrow when back at [facility].   Chaplain Katherene Ponto

## 2017-12-09 NOTE — Telephone Encounter (Signed)
-----   Message from [place], [place] sent at 12/09/2017 12:35 PM EST ----- Regarding: Needs follow up visit Can you set up follow up in 2-4 weeks with Christian Lamb after recent hospitalization for Crohns. Thank you-JLL

## 2017-12-09 NOTE — Progress Notes (Signed)
Physical Therapy Treatment Patient Details Name: Christian Lamb MRN: [id number] DOB: 02/25/1976 Today's Date: 12/09/2017    History of Present Illness Christian Lamb is a 42 y.o. 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  Patient  found to also have excoriated buttock s from diarrhea, also with darkened toes, for ABI's to eavaluate. negative DVT.    PT Comments    Patient progressing with mobility and able to assist with OOB transfer.  He currently needs +2 A for safety so remains appropriate for SNF level rehab at d/c.  PT to follow acutely.   Follow Up Recommendations  SNF;Supervision/Assistance - 24 hour     Equipment Recommendations  Other (comment)(TBA)    Recommendations for Other Services       Precautions / Restrictions Precautions Precautions: Fall Precaution Comments: rectal pouch, toes both feet ischemic and lack of sensation    Mobility  Bed Mobility Overal bed mobility: Needs Assistance Bed Mobility: Supine to Sit     Supine to sit: Min assist;HOB elevated     General bed mobility comments: used rail to lift trunk, able to move feet off bed with min A  Transfers Overall transfer level: Needs assistance Equipment used: Rolling walker (2 wheeled) Transfers: Sit to/from [facility] Sit to Stand: Mod assist;+2 physical assistance Stand pivot transfers: Mod assist;+2 safety/equipment       General transfer comment: lifting help from EOB; took steps with RW to pivot to chair  Ambulation/Gait                 Stairs            Wheelchair Mobility    Modified Rankin (Stroke Patients Only)       Balance Overall balance assessment: Needs assistance Sitting-balance support: Feet  supported Sitting balance-Leahy Scale: Good     Standing balance support: Bilateral upper extremity supported Standing balance-Leahy Scale: Poor Standing balance comment: UE support and assist for balance                            Cognition Arousal/Alertness: Awake/alert Behavior During Therapy: WFL for tasks assessed/performed Overall Cognitive Status: No family/caregiver present to determine baseline cognitive functioning                                        Exercises Other Exercises Other Exercises: seated hip flexion x 10, armchair pushups x 5    General Comments General comments (skin integrity, edema, etc.): HR max 130 during seated therex, 120 with OOB, BP 112/73 seated      Pertinent Vitals/Pain Pain Score: 0-No pain    Home Living                      Prior Function            PT Goals (current goals can now be found in the care plan section) Progress towards PT goals: Progressing toward goals    Frequency    Min 2X/week      PT Plan Current plan remains appropriate    Co-evaluation              AM-PAC PT "6 Clicks" Daily  Activity  Outcome Measure  Difficulty turning over in bed (including adjusting bedclothes, sheets and blankets)?: A Lot Difficulty moving from lying on back to sitting on the side of the bed? : Unable Difficulty sitting down on and standing up from a chair with arms (e.g., wheelchair, bedside commode, etc,.)?: Unable Help needed moving to and from a bed to chair (including a wheelchair)?: A Lot Help needed walking in hospital room?: Total Help needed climbing 3-5 steps with a railing? : Total 6 Click Score: 8    End of Session Equipment Utilized During Treatment: Gait belt Activity Tolerance: Patient limited by fatigue Patient left: with chair alarm set;in chair;with call bell/phone within reach   PT Visit Diagnosis: Unsteadiness on feet (R26.81);Muscle weakness (generalized)  (M62.81)     Time: 3419-6222 PT Time Calculation (min) (ACUTE ONLY): 20 min  Charges:  $Therapeutic Activity: 8-22 mins                    G CodesMagda Lamb, [place] [phone] 12/09/2017    Christian Lamb 12/09/2017, 11:47 AM

## 2017-12-09 NOTE — Progress Notes (Signed)
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 Pt reported on admission that he lives in a house that is not clean, no acces to clean water and foot, no heating. He has also reported feet were cold and turning dark in color. His caseworker came to see him on the day of the admission and called EMS for pt to be admitted to the hospital.   Assessment/Plan:  Sepsis present on admission. sepsis criteria met on admission, T 102.15F, WBC 25 K. source of infection possible UTI, GI source, L hip and buttock area open wound, also bilateral ? Necrotic toes. blood cultures with no growth, urine culture with multiple species present, C. Diff neg, GI stool panel negative. Afebrile for 24 hrs. cont iv Zosyn, vanc Monitor    BL lower extremity, frostbite the his toes. Necrotic right second toe.  pt has had doppler done which ruled out DVTs. No evidence of osteomyelitis on x ray. We will cont empiric iv antibiotics,. Orthopedics is following, allowing to demarcate prior to further surgical recommendations. . Appreciate the input   AK injury and CKD3, rhabdomyolysis. Cr is now WNL. CK level also trending down   Elevated LFT. Drug screen negative, alcohol < 10 on presentation. LFT elevation likely from sepsis and rhabdomyolysis. hep panel and HIV negative. LFT's overall trending down  UC with diarrhea. stool panel negative. C. Diff negative. continue budesonide. appreciate GI team assistance. per GI team, continue cholestyramine 4 gm TID, Lomotil TID   Tachycardia. review of records indicate this is somewhat chronic problem. tsh-1.5.  try low dose bb.   Hypokalemia. on low end of normal, will continue to supplement, keep K ~4  Metabolic acidosis. suspect from diarrhea and dehydration. on bicarb supplements. overall stable  Anemia. suspect from chronic illness, UC. no evidence of active bleeding.  Skin wounds. Moisture associated skin damage to left hip and buttocks from excessive diarrhea and exposure to urine and stool during acute illness, present on admission.  - Left hip: 22 cm x 3 cm x 0.2 cm. Buttocks: Both gluteal areas affected: Each measures: 8 cm x 6 cm x 0.2 cm. WOC recommendations: Cleanse buttocks and left hip wounds with NS and pat dry. Apply Gerhardts butt paste twice daily and PRN soilage. No disposable briefs or underpads while in bed.   Severe PCM, underweight, hypoalbuminemia. appreciate nutritionist recommendations. Body mass index is 16.27 kg/m. Start ensure, vitamins. Check acth stim test   Schizophrenia/schizoaffective disorder. Stable. continue Benztropine   Tobacco abuse. continue Nicotine patch    Code Status: full Family Communication: d/w patient, RN (indicate person spoken with, relationship, and if by phone, the number) Disposition Plan: needs SNF. Not medically ready yet   Consultants:  [facility]  Ortho   Procedures:  Rectal tube insertion  Antibiotics:  Zosyn 02/28 -->  Vancomycin 03/03 -->   HPI/Subjective: Alert. Reports feeling somewhat better. Still mil d tachycardia. High output stool   Objective: Vitals:   12/08/17 2150 12/09/17 0356  BP: 110/66 116/67  Pulse: (!) 111 91  Resp: 18 18  Temp: 98.9 F (37.2 C) 98.7 F (37.1 C)  SpO2: 97% 96%    Intake/Output Summary (Last 24 hours) at 12/09/2017 0855 Last data filed at 12/09/2017 0655 Gross per 24  hour  Intake 5006.68 ml  Output 4750 ml  Net 256.68 ml   Filed Weights   12/04/17 2107  Weight: 54.4 kg (120 lb)    Exam:   General:  No distress   Cardiovascular: s1,s2 tachycardia    Respiratory: no wheezing   Abdomen: soft, nt, nd   Musculoskeletal: mild pedal edema. bl lower extremity toe frostbite    Data Reviewed: Basic Metabolic Panel: Recent Labs  Lab 12/04/17 1820 12/05/17 0330 12/06/17 0323 12/07/17 0317 12/08/17 0445  NA 140 143 138 138 140  K 5.2* 4.2 3.4* 3.6 3.8  CL 110 114* 110 111 112*  CO2 19* 21* 22 21* 23  GLUCOSE 90 71 100* 92 85  BUN 56* 35* _0 CREATININE 1.56* 1.29* 1.12 0.86 0.89  CALCIUM 7.5* 7.3* 7.6* 7.5* 7.6*  MG 2.4  --   --   --   --   PHOS 4.1  --   --   --   --    Liver Function Tests: Recent Labs  Lab 12/04/17 1820 12/05/17 0330 12/06/17 0323 12/07/17 0317 12/08/17 0445  AST 164* 176* 133* 97* 78*  ALT 88* 80* 73* 70* 69*  ALKPHOS 84 70 72 75 61  BILITOT 1.6* 1.7* 1.4* 0.9 0.7  PROT 7.4 6.9 6.3* 6.2* 6.2*  ALBUMIN 2.0* 1.7* 1.4* 1.3* 1.4*   Recent Labs  Lab 12/04/17 1820  LIPASE 21   No results for input(s): AMMONIA in the last 168 hours. CBC: Recent Labs  Lab 12/04/17 1629 12/05/17 0330 12/06/17 0323 12/07/17 0317 12/08/17 0445 12/09/17 0643  WBC 25.7* 24.9* 32.1* 35.0* 26.3* 25.5*  NEUTROABS 22.1*  --   --   --   --   --   HGB 11.0* 8.9* 7.8* 7.6* 8.0* 7.8*  HCT 33.4* 27.8* 24.5* 23.8* 25.8* 25.0*  MCV 86.5 89.1 87.2 90.5 90.5 90.6  PLT 354 266 329 338 418* 441*   Cardiac Enzymes: Recent Labs  Lab 12/04/17 1953 12/06/17 0323 12/07/17 0317 12/08/17 0445 12/09/17 0643  CKTOTAL 6,022* 4,589* 3,020* 2,194* 1,563*  CKMB 50.2*  --   --   --   --    BNP (last 3 results) No results for input(s): BNP in the last 8760 hours.  ProBNP (last 3 results) No results for input(s): PROBNP in the last 8760 hours.  CBG: No results for input(s): GLUCAP in the last 168 hours.  Recent Results (from the past 240 hour(s))  Blood Culture (routine x 2)     Status: None (Preliminary result)   Collection Time: 12/04/17  6:20 PM  Result Value Ref Range Status   Specimen Description   Final     BLOOD LEFT ARM Performed at [facility] [address] [place], [place] [address]    Special Requests   Final    IN PEDIATRIC BOTTLE Blood Culture adequate volume Performed at [facility] [address] [place] [address], [place], [place] [address]    Culture   Final    NO GROWTH 4 DAYS Performed at [facility], [place] [address], [place], [place] [address]    Report Status PENDING  Incomplete  Blood Culture (routine x 2)     Status: None (Preliminary result)   Collection Time: 12/04/17  6:46 PM  Result Value Ref Range Status   Specimen Description   Final    BLOOD RIGHT ANTECUBITAL Performed at [facility] [address], [place], [place] [address]  Special Requests   Final    BOTTLES DRAWN AEROBIC AND ANAEROBIC Blood Culture adequate volume Performed at [facility] [address], [place], [place] [address]    Culture   Final    NO GROWTH 4 DAYS Performed at [facility], [place] [address], [place], [place] [address]    Report Status PENDING  Incomplete  C difficile quick scan w PCR reflex     Status: None   Collection Time: 12/04/17  7:16 PM  Result Value Ref Range Status   C Diff antigen NEGATIVE NEGATIVE Final   C Diff toxin NEGATIVE NEGATIVE Final   C Diff interpretation No C. difficile detected.  Final    Comment: Performed at [facility], [place] [address] [place], [place] [address]  Gastrointestinal Panel by PCR , Stool     Status: None   Collection Time: 12/04/17  7:16 PM  Result Value Ref Range Status   Campylobacter species NOT DETECTED NOT DETECTED Final   Plesimonas shigelloides NOT DETECTED NOT DETECTED Final   Salmonella species NOT DETECTED NOT DETECTED Final   Yersinia enterocolitica NOT DETECTED NOT DETECTED Final   Vibrio species NOT DETECTED NOT DETECTED Final   Vibrio cholerae NOT DETECTED NOT DETECTED Final   Enteroaggregative E coli (EAEC) NOT DETECTED NOT  DETECTED Final   Enteropathogenic E coli (EPEC) NOT DETECTED NOT DETECTED Final   Enterotoxigenic E coli (ETEC) NOT DETECTED NOT DETECTED Final   Shiga like toxin producing E coli (STEC) NOT DETECTED NOT DETECTED Final   Shigella/Enteroinvasive E coli (EIEC) NOT DETECTED NOT DETECTED Final   Cryptosporidium NOT DETECTED NOT DETECTED Final   Cyclospora cayetanensis NOT DETECTED NOT DETECTED Final   Entamoeba histolytica NOT DETECTED NOT DETECTED Final   Giardia lamblia NOT DETECTED NOT DETECTED Final   Adenovirus F40/41 NOT DETECTED NOT DETECTED Final   Astrovirus NOT DETECTED NOT DETECTED Final   Norovirus GI/GII NOT DETECTED NOT DETECTED Final   Rotavirus A NOT DETECTED NOT DETECTED Final   Sapovirus (I, II, IV, and V) NOT DETECTED NOT DETECTED Final    Comment: Performed at [facility], [place] [address] [place], [place] [address]  MRSA PCR Screening     Status: None   Collection Time: 12/04/17 11:29 PM  Result Value Ref Range Status   MRSA by PCR NEGATIVE NEGATIVE Final    Comment:        The GeneXpert MRSA Assay (FDA approved for NASAL specimens only), is one component of a comprehensive MRSA colonization surveillance program. It is not intended to diagnose MRSA infection nor to guide or monitor treatment for MRSA infections. Performed at [facility], [place] [address] [place], [place] [address]   Culture, Urine     Status: Abnormal   Collection Time: 12/05/17  9:43 AM  Result Value Ref Range Status   Specimen Description   Final    URINE, CLEAN CATCH Performed at [facility], [place] [address], [place], [place] [address]    Special Requests   Final    NONE Performed at [facility], [place] [address] [place], [place] [address]    Culture MULTIPLE SPECIES PRESENT, SUGGEST RECOLLECTION (A)  Final   Report Status 12/06/2017 FINAL  Final     Studies: No results found.  Scheduled Meds: . benztropine  0.5 mg  Oral QHS  . budesonide  9 mg Oral Daily  . cholestyramine  4 g Oral 3 times per  day  . diphenoxylate-atropine  1 tablet Oral TID  . enoxaparin (LOVENOX) injection  40 mg Subcutaneous Q24H  . feeding supplement (ENSURE ENLIVE)  237 mL Oral Q24H  . folic acid  1 mg Oral Daily  . Gerhardt's butt cream   Topical BID  . mesalamine  4 g Rectal QHS  . metoprolol tartrate  12.5 mg Oral BID  . multivitamin  1 tablet Oral Daily  . nicotine  21 mg Transdermal Q24H  . sodium bicarbonate  650 mg Oral Daily  . thiamine injection  100 mg Intravenous Daily   Continuous Infusions: . dextrose 5 % and 0.45 % NaCl with KCl 20 mEq/L 100 mL/hr at 12/09/17 0012  . piperacillin-tazobactam (ZOSYN)  IV 3.375 g (12/09/17 0640)  . 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 If 7PM-7AM, please contact night-coverage at [email or web address], password [facility] 12/09/2017, 8:55 AM  LOS: 5 days

## 2017-12-09 NOTE — Telephone Encounter (Signed)
12/31/17 8:30 am appt scheduled with Nevin Bloodgood, will be notified at discharge.

## 2017-12-10 LAB — ACTH STIMULATION, 3 TIME POINTS
CORTISOL 60 MIN: 18 ug/dL
Cortisol, 30 Min: 13.4 ug/dL
Cortisol, Base: 7.6 ug/dL

## 2017-12-10 LAB — CBC
HCT: 25.3 % — ABNORMAL LOW (ref 39.0–52.0)
Hemoglobin: 7.8 g/dL — ABNORMAL LOW (ref 13.0–17.0)
MCH: 28.6 pg (ref 26.0–34.0)
MCHC: 30.8 g/dL (ref 30.0–36.0)
MCV: 92.7 fL (ref 78.0–100.0)
PLATELETS: 453 10*3/uL — AB (ref 150–400)
RBC: 2.73 MIL/uL — ABNORMAL LOW (ref 4.22–5.81)
RDW: 15 % (ref 11.5–15.5)
WBC: 22.3 10*3/uL — AB (ref 4.0–10.5)

## 2017-12-10 LAB — CREATININE, SERUM
CREATININE: 0.86 mg/dL (ref 0.61–1.24)
GFR calc Af Amer: >60 mL/min (ref >60)
GFR calc non Af Amer: >60 mL/min (ref >60)

## 2017-12-10 LAB — CK: CK TOTAL: 874 U/L — AB (ref 49–397)

## 2017-12-10 MED ORDER — SALINE SPRAY 0.65 % NA SOLN
1.0000 | NASAL | Status: DC | PRN
  Administered 2017-12-10: 1 via NASAL
  Filled 2017-12-10: qty 44

## 2017-12-10 MED ORDER — DEXTROSE-NACL 5-0.9 % IV SOLN
INTRAVENOUS | Status: AC
  Administered 2017-12-10: 15:00:00 via INTRAVENOUS

## 2017-12-10 NOTE — Progress Notes (Signed)
Pharmacy Antibiotic Note  Christian Lamb is a 42 y.o. male presented to the ED on 12/04/2017 with large areas of broken skin on hip and buttocks and c/o diarrhea and SOB.  To start broad abx with vancomycin and zosyn for suspected sepsis.  Today, 12/10/17  Afebrile  SCr stable  WBC 22.3   Plan: Day 6 full antibiotics  Continue current Zosyn dosing  Continue  vancomycin 1260m IV q24 - (AUC 468, Scr 0.86)  Anticipate de-escalation in 1-2 days  Daily Scr  _______________________________  Temp (24hrs), Avg:98.3 F (36.8 C), Min:98.1 F (36.7 C), Max:98.5 F (36.9 C)  Recent Labs  Lab 12/04/17 1638  12/05/17 0330 12/06/17 0323 12/07/17 0317 12/08/17 0445 12/09/17 0643 12/10/17 0443  WBC  --   --  24.9* 32.1* 35.0* 26.3* 25.5* 22.3*  CREATININE  --    < > 1.29* 1.12 0.86 0.89  --  0.86  LATICACIDVEN 1.77  --   --   --   --   --   --   --    < > = values in this interval not displayed.    Estimated Creatinine Clearance: 86.1 mL/min (by C-G formula based on SCr of 0.86 mg/dL).    No Known Allergies    Antimicrobials this admission:  2/28 vanc >> 3/1, 3/2 >> 2/28 zosyn >>   Microbiology results:  2/28 BCx: ngtd 2/28 C. Diff: neg/neg 2/28 GI panel: negative 2/28 MRSA PCR: neg 3/1 urine: multiple, suggest recollect  Thank you for allowing pharmacy to be a part of this patient's care.   EDolly RiasRPh 12/10/2017, 11:43 AM Pager [phone]

## 2017-12-10 NOTE — Progress Notes (Signed)
Follow-up 3 wks post discharge to allow hopeful improved nutrition to improve his chance of healing after toe amputation surgery, as well as time for complete demarcation for level of amputation.

## 2017-12-10 NOTE — Progress Notes (Signed)
   12/10/17 1500  Clinical Encounter Type  Visited With Patient;Health care provider  Visit Type Follow-up  Spiritual Encounters  Spiritual Needs Prayer   Patient seemed to be more at ease today.  Nurse indicated his mother had been by today.  Patient has a good outlook and hopes to be better soon.  He is worried about his foot.  Indicated he is getting good care here at the hospital.  Not as interested in talking as he was yesterday.  Will follow and support as needed. Chaplain Katherene Ponto

## 2017-12-10 NOTE — Progress Notes (Signed)
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 Pt reported on admission that he lives in a house that is not clean, no acces to clean water and foot, no heating. He has also reported feet were cold and turning dark in color. His caseworker came to see him on the day of the admission and called EMS for pt to be admitted to the hospital.   Assessment/Plan:  Sepsis present on admission. sepsis criteria met on admission, T 102.46F, WBC 25 K. source of infection possible UTI, GI source, L hip and buttock area open wound, also bilateral ? Necrotic toes. blood cultures with no growth, urine culture with multiple species present, C. Diff neg, GI stool panel negative. Afebrile for 24 hrs. btu persistent leukocytosis. cont iv Zosyn, vanc, will deescalate in 24-48 hrs. Monitor    BL lower extremity, frostbite the his toes. Necrotic right second toe.  pt has had doppler done which ruled out DVTs. No evidence of osteomyelitis on x ray. We will cont empiric iv antibiotics,. Orthopedics is following, allowing to demarcate prior to further surgical recommendations. Appreciate the input   AK injury and CKD3, rhabdomyolysis. Cr is now WNL. CK level also trending down. Will d/c IVF tomorrow   Elevated LFT. Drug screen negative, alcohol < 10 on presentation. LFT elevation likely from sepsis and rhabdomyolysis. hep panel and HIV negative. LFT's overall trending down  UC with diarrhea. stool panel negative. C. Diff negative. continue budesonide. appreciate GI team assistance. per GI team, continue cholestyramine 4 gm TID, Lomotil TID    Tachycardia. review of records indicate this is somewhat chronic problem. tsh-1.5. try low dose bb.   Hypokalemia. on low end of normal, will continue to supplement, keep K ~4  Metabolic acidosis. suspect from diarrhea and dehydration. on bicarb supplements. overall stable  Anemia. suspect from chronic illness, UC. no evidence of active bleeding.  Skin wounds. Moisture associated skin damage to left hip and buttocks from excessive diarrhea and exposure to urine and stool during acute illness, present on admission.  - Left hip: 22 cm x 3 cm x 0.2 cm. Buttocks: Both gluteal areas affected: Each measures: 8 cm x 6 cm x 0.2 cm. WOC recommendations: Cleanse buttocks and left hip wounds with NS and pat dry. Apply Gerhardts butt paste twice daily and PRN soilage. No disposable briefs or underpads while in bed.   Severe PCM, underweight, hypoalbuminemia. appreciate nutritionist recommendations. Body mass index is 16.27 kg/m. Start ensure, vitamins.  -Check acth stim test   Schizophrenia/schizoaffective disorder. Stable. continue Benztropine   Tobacco abuse. continue Nicotine patch    Code Status: full Family Communication: d/w patient, RN (indicate person spoken with, relationship, and if by phone, the number) Disposition Plan: needs SNF. Not medically ready yet   Consultants:  [facility]  Ortho   Procedures:  Rectal tube insertion  Antibiotics:  Zosyn 02/28 -->  Vancomycin 03/03 -->   HPI/Subjective: Alert. Reports feeling somewhat better. Still mil d tachycardia. High output stool   Objective: Vitals:   12/09/17 2209 12/10/17 0511  BP: 125/75 120/68  Pulse: (!) 108 (!) 107  Resp: 18 18  Temp: 98.1 F (36.7 C) 98.3 F (36.8 C)  SpO2: 98% 99%    Intake/Output Summary (Last 24  hours) at 12/10/2017 0848 Last data filed at 12/10/2017 0839 Gross per 24 hour  Intake 2510 ml  Output 5200 ml  Net -2690 ml   Filed Weights   12/04/17 2107  Weight: 54.4 kg  (120 lb)    Exam:   General:  No distress   Cardiovascular: s1,s2 tachycardia   Respiratory: no wheezing   Abdomen: soft, nt, nd   Musculoskeletal: mild pedal edema. bl lower extremity toe frostbite    Data Reviewed: Basic Metabolic Panel: Recent Labs  Lab 12/04/17 1820 12/05/17 0330 12/06/17 0323 12/07/17 0317 12/08/17 0445 12/10/17 0443  NA 140 143 138 138 140  --   K 5.2* 4.2 3.4* 3.6 3.8  --   CL 110 114* 110 111 112*  --   CO2 19* 21* 22 21* 23  --   GLUCOSE 90 71 100* 92 85  --   BUN 56* 35* _0 --   CREATININE 1.56* 1.29* 1.12 0.86 0.89 0.86  CALCIUM 7.5* 7.3* 7.6* 7.5* 7.6*  --   MG 2.4  --   --   --   --   --   PHOS 4.1  --   --   --   --   --    Liver Function Tests: Recent Labs  Lab 12/04/17 1820 12/05/17 0330 12/06/17 0323 12/07/17 0317 12/08/17 0445  AST 164* 176* 133* 97* 78*  ALT 88* 80* 73* 70* 69*  ALKPHOS 84 70 72 75 61  BILITOT 1.6* 1.7* 1.4* 0.9 0.7  PROT 7.4 6.9 6.3* 6.2* 6.2*  ALBUMIN 2.0* 1.7* 1.4* 1.3* 1.4*   Recent Labs  Lab 12/04/17 1820  LIPASE 21   No results for input(s): AMMONIA in the last 168 hours. CBC: Recent Labs  Lab 12/04/17 1629  12/06/17 0323 12/07/17 0317 12/08/17 0445 12/09/17 0643 12/10/17 0443  WBC 25.7*   < > 32.1* 35.0* 26.3* 25.5* 22.3*  NEUTROABS 22.1*  --   --   --   --   --   --   HGB 11.0*   < > 7.8* 7.6* 8.0* 7.8* 7.8*  HCT 33.4*   < > 24.5* 23.8* 25.8* 25.0* 25.3*  MCV 86.5   < > 87.2 90.5 90.5 90.6 92.7  PLT 354   < > 329 338 418* 441* 453*   < > = values in this interval not displayed.   Cardiac Enzymes: Recent Labs  Lab 12/04/17 1953 12/06/17 0323 12/07/17 0317 12/08/17 0445 12/09/17 0643 12/10/17 0443  CKTOTAL 6,022* 4,589* 3,020* 2,194* 1,563* 874*  CKMB 50.2*  --   --   --   --   --    BNP (last 3 results) No results for input(s): BNP in the last 8760 hours.  ProBNP (last 3 results) No results for input(s): PROBNP in the last 8760 hours.  CBG: No results for  input(s): GLUCAP in the last 168 hours.  Recent Results (from the past 240 hour(s))  Blood Culture (routine x 2)     Status: None   Collection Time: 12/04/17  6:20 PM  Result Value Ref Range Status   Specimen Description   Final    BLOOD LEFT ARM Performed at [facility] [address] [place], [place] [address]    Special Requests   Final    IN PEDIATRIC BOTTLE Blood Culture adequate volume Performed at [facility] [address], [place], [place] [address]    Culture   Final  NO GROWTH 5 DAYS Performed at [facility], [place] [address] [place], [place] [address]    Report Status 12/09/2017 FINAL  Final  Blood Culture (routine x 2)     Status: None   Collection Time: 12/04/17  6:46 PM  Result Value Ref Range Status   Specimen Description   Final    BLOOD RIGHT ANTECUBITAL Performed at [facility] [address] [place], [place] [address]    Special Requests   Final    BOTTLES DRAWN AEROBIC AND ANAEROBIC Blood Culture adequate volume Performed at [facility] [address] [place], [place] [address]    Culture   Final    NO GROWTH 5 DAYS Performed at [facility], [place] [address], [place], [place] [address]    Report Status 12/09/2017 FINAL  Final  C difficile quick scan w PCR reflex     Status: None   Collection Time: 12/04/17  7:16 PM  Result Value Ref Range Status   C Diff antigen NEGATIVE NEGATIVE Final   C Diff toxin NEGATIVE NEGATIVE Final   C Diff interpretation No C. difficile detected.  Final    Comment: Performed at [facility], [place] [address], [place], [place] [address]  Gastrointestinal Panel by PCR , Stool     Status: None   Collection Time: 12/04/17  7:16 PM  Result Value Ref Range Status   Campylobacter species NOT DETECTED NOT DETECTED Final   Plesimonas shigelloides NOT DETECTED NOT DETECTED Final   Salmonella species NOT DETECTED NOT  DETECTED Final   Yersinia enterocolitica NOT DETECTED NOT DETECTED Final   Vibrio species NOT DETECTED NOT DETECTED Final   Vibrio cholerae NOT DETECTED NOT DETECTED Final   Enteroaggregative E coli (EAEC) NOT DETECTED NOT DETECTED Final   Enteropathogenic E coli (EPEC) NOT DETECTED NOT DETECTED Final   Enterotoxigenic E coli (ETEC) NOT DETECTED NOT DETECTED Final   Shiga like toxin producing E coli (STEC) NOT DETECTED NOT DETECTED Final   Shigella/Enteroinvasive E coli (EIEC) NOT DETECTED NOT DETECTED Final   Cryptosporidium NOT DETECTED NOT DETECTED Final   Cyclospora cayetanensis NOT DETECTED NOT DETECTED Final   Entamoeba histolytica NOT DETECTED NOT DETECTED Final   Giardia lamblia NOT DETECTED NOT DETECTED Final   Adenovirus F40/41 NOT DETECTED NOT DETECTED Final   Astrovirus NOT DETECTED NOT DETECTED Final   Norovirus GI/GII NOT DETECTED NOT DETECTED Final   Rotavirus A NOT DETECTED NOT DETECTED Final   Sapovirus (I, II, IV, and V) NOT DETECTED NOT DETECTED Final    Comment: Performed at [facility], [address] [place], [place] [address]  MRSA PCR Screening     Status: None   Collection Time: 12/04/17 11:29 PM  Result Value Ref Range Status   MRSA by PCR NEGATIVE NEGATIVE Final    Comment:        The GeneXpert MRSA Assay (FDA approved for NASAL specimens only), is one component of a comprehensive MRSA colonization surveillance program. It is not intended to diagnose MRSA infection nor to guide or monitor treatment for MRSA infections. Performed at [facility], [place] [address], [place], [place] [address]   Culture, Urine     Status: Abnormal   Collection Time: 12/05/17  9:43 AM  Result Value Ref Range Status   Specimen Description   Final    URINE, CLEAN CATCH Performed at [facility], [place] [address], [place], [place] [address]  Special Requests   Final    NONE Performed at [facility], [place] [address], [place], [place] [address]    Culture MULTIPLE SPECIES PRESENT, SUGGEST RECOLLECTION (A)  Final   Report Status 12/06/2017 FINAL  Final     Studies: No results found.  Scheduled Meds: . benztropine  0.5 mg Oral QHS  . budesonide  9 mg Oral Daily  . cholestyramine  4 g Oral 3 times per day  . diphenoxylate-atropine  1 tablet Oral TID  . enoxaparin (LOVENOX) injection  40 mg Subcutaneous Q24H  . feeding supplement (ENSURE ENLIVE)  237 mL Oral Q24H  . folic acid  1 mg Oral Daily  . Gerhardt's butt cream   Topical BID  . mesalamine  4 g Rectal QHS  . metoprolol tartrate  25 mg Oral BID  . multivitamin  1 tablet Oral Daily  . nicotine  21 mg Transdermal Q24H  . sodium bicarbonate  650 mg Oral Daily  . thiamine injection  100 mg Intravenous Daily   Continuous Infusions: . piperacillin-tazobactam (ZOSYN)  IV 3.375 g (12/10/17 0606)  . 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 If 7PM-7AM, please contact night-coverage at [email or web address], password [facility] 12/10/2017, 8:48 AM  LOS: 6 days

## 2017-12-10 NOTE — Progress Notes (Addendum)
CSW following for disposition assistance. See previous notes. Today, received call back from pt's Mother Izora Gala [phone]. She confirms that she has secured a new apartment for pt and preparing to move him in there at DC from hospital. Confirms that pt has been communicating with her throughout hospitalization and she is aware of care needs/plan as discussed with CSW (wound care, PT, etc.) Mother reports she is working with "Mr. McNeill" with [facility] and they are working to arrange having a nurse/aide come to pt's apartment to assist him after DC. Mother also planning to stay with him once he arrives home. CSW discussed that SNF has been recommended from therapy standpoint and that pt declining this. Mother reports she feels she can care for him at home with the assistance mentioned above. Also states she can transport him home from the hospital. Mother agreed to keep in contact with CSW re: his needs.  Plan: Per pt and his mother, plan to have pt go home (to newly secured apartment) at DC.   Sharren Bridge, MSW, LCSW Clinical Social Work 12/10/2017 [phone]  12:30- received call back from pt's [facility] case worker Netta Neat [phone] ex [phone]   She confirms team has secured new affordable housing for pt. Reports pt was living in boarding house in "unsanitary conditions" and housing was secured just before hospital admission.  Case worker states pt receives help with working on grooming and ADLs from [facility] program.  Case worker reports that peer support specialist from [facility] had been working on getting home health for pt (see mom's reporting this above as well) however they had not secured an agency prior to hospital admission. Aware pt's home health needs now have escalated and case worker states they would appreciate hospital assistance with DC planning.

## 2017-12-10 NOTE — Progress Notes (Signed)
CSW following to assist with disposition. See assessment 12/08/17.  CSW as of yet unable to get in contact with individuals pt named for assistance with support post- DC [employer] employment support team, [place] & Janett Billow [phone] ex [phone] who pt had stated was working toward home health support, and mother Izora Gala [phone] who pt states had secured pt a new apartment to live in). Have left voicemails and will continue attempting to gather collateral information so that appropriate DC plan can be made.   Pt maintains he will not agree to pursue or admit to SNF. Disposition pending.  Sharren Bridge, MSW, LCSW Clinical Social Work 12/10/2017 [phone]

## 2017-12-11 DIAGNOSIS — N179 Acute kidney failure, unspecified: Secondary | ICD-10-CM

## 2017-12-11 LAB — COMPREHENSIVE METABOLIC PANEL
ALBUMIN: 1.3 g/dL — AB (ref 3.5–5.0)
ALT: 58 U/L (ref 17–63)
AST: 41 U/L (ref 15–41)
Alkaline Phosphatase: 63 U/L (ref 38–126)
Anion gap: 7 (ref 5–15)
BUN: 10 mg/dL (ref 6–20)
CHLORIDE: 107 mmol/L (ref 101–111)
CO2: 27 mmol/L (ref 22–32)
CREATININE: 0.85 mg/dL (ref 0.61–1.24)
Calcium: 7.8 mg/dL — ABNORMAL LOW (ref 8.9–10.3)
GFR calc Af Amer: >60 mL/min (ref >60)
GFR calc non Af Amer: >60 mL/min (ref >60)
GLUCOSE: 112 mg/dL — AB (ref 65–99)
POTASSIUM: 3.8 mmol/L (ref 3.5–5.1)
SODIUM: 141 mmol/L (ref 135–145)
Total Bilirubin: 0.5 mg/dL (ref 0.3–1.2)
Total Protein: 6.7 g/dL (ref 6.5–8.1)

## 2017-12-11 LAB — CBC
HCT: 25.6 % — ABNORMAL LOW (ref 39.0–52.0)
Hemoglobin: 7.8 g/dL — ABNORMAL LOW (ref 13.0–17.0)
MCH: 28.4 pg (ref 26.0–34.0)
MCHC: 30.5 g/dL (ref 30.0–36.0)
MCV: 93.1 fL (ref 78.0–100.0)
PLATELETS: 463 10*3/uL — AB (ref 150–400)
RBC: 2.75 MIL/uL — AB (ref 4.22–5.81)
RDW: 15.7 % — AB (ref 11.5–15.5)
WBC: 20.6 10*3/uL — AB (ref 4.0–10.5)

## 2017-12-11 LAB — CK: CK TOTAL: 534 U/L — AB (ref 49–397)

## 2017-12-11 NOTE — Progress Notes (Signed)
Nutrition Follow-up  DOCUMENTATION CODES:   Underweight, Severe malnutrition in context of chronic illness  INTERVENTION:    Ensure Enlive po daily, each supplement provides 350 kcal and 20 grams of protein  Hormel shake po BID, each supplement provides 520kcal and 22g of protein.   NUTRITION DIAGNOSIS:   Severe Malnutrition related to chronic illness(Chronic ulcerative colitis s/p total colectomy) as evidenced by severe fat depletion, severe muscle depletion.  Ongoing  GOAL:   Patient will meet greater than or equal to 90% of their needs  Meeting  MONITOR:   PO intake, Supplement acceptance, Skin, I & O's, Labs  REASON FOR ASSESSMENT:   Malnutrition Screening Tool    ASSESSMENT:   Pt with PMH of schizophrenia, anemia, chronic diarrhea and ulcerative colitis/chronic ulcerative enterocolitis s/p total colectomy (2003) and small intestine surgery (2016). Pt with ileal pouch-anal anastomosis, who presents with ongoing diarrhea and SOB, found sepsis secondary to UTI   Pt unable to provide much information. Reports appetite is stable and that he's drinking all supplements being sent. Meal completions charted as 75% average for the last eight meals. Pt had scrambled eggs, english muffin, and pancakes for breakfast without any complication. A recent weight has not been obtained since last RD visit. Will continue with current interventions. Recommend pt to continue supplementation at facility to preserve lean body mass and aid in wound healing. Included protein supplement information in discharge instructions.   Patient noted to have frost bite on right second toe. Cleared by orthopedics and is to follow up in 3 weeks to reassess need for ampuation/debridement (allowing to demarcate prior to surgical intervention).   Medications reviewed and include: folic acid, MVI, sodium bicarbonate, IV abx  Labs reviewed.   Diet Order:  Diet Heart Room service appropriate? Yes; Fluid  consistency: Thin  EDUCATION NEEDS:   Not appropriate for education at this time  Skin:  Skin Assessment: Skin Integrity Issues: Skin Integrity Issues:: Other (Comment) Other: buttocks/hip/frost bite right second toe  Last BM:  12/10/17- 700 ml rectal tube  Height:   Ht Readings from Last 1 Encounters:  12/04/17 6' (1.829 m)    Weight:   Wt Readings from Last 1 Encounters:  12/04/17 120 lb (54.4 kg)    Ideal Body Weight:  80.9 kg  BMI:  Body mass index is 16.27 kg/m.  Estimated Nutritional Needs:   Kcal:  1700-1900  Protein:  85-95 grams  Fluid:  >/= 1.7 L/d    Mariana Single RD, LDN Clinical Nutrition Pager # - [phone]

## 2017-12-11 NOTE — Progress Notes (Signed)
Patient Demographics:    Christian Lamb, is a 41 y.o. male, DOB - 06/06/1976, RNH:[id number]  Admit date - 12/04/2017   Admitting Physician Norval Morton, MD  Outpatient Primary MD for the patient is [name]  LOS - 7   Chief Complaint  Patient presents with  . Diarrhea  . 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 Pt reported on admission that he lives in a house that is not clean, no acces to clean water and foot, no heating. He has also reported feet were cold and turning dark in color. His caseworker came to see him on the day of the admission and called EMS for pt to be admitted to the hospital.   Assessment/Plan:  1)Sepsis present on admission-  sepsis criteria met on admission, pt has Necrotic toes. blood cultures with no growth, urine culture with multiple species present, C. Diff neg, GI stool panel negative. Afebrile cont iv Zosyn for possible bowel/intra-abdominal infection  2)BL lower extremity, frostbite the his toes-  Necrotic right second toe, venous Doppler negative for DVTs. No evidence of  osteomyelitis on x ray.  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 LFT elevation likely from sepsis and rhabdomyolysis. hep panel and HIV negative. LFT's overall trending down  6)Metabolic Acidosis - secondary to diarrhea GI losses with dehydration , give bicarb, replace electrolytes  7)Chronic Anemia.-Due to poor nutritional intake, and underlying chronic colitis, no evidence of ongoing GI bleed   8)Skin wounds- POA-   Moisture associated skin damage to left hip and buttocks from excessive diarrhea and exposure to urine and stool during acute illness, present on admission.  - Left hip: 22 cm x 3 cm x 0.2 cm. Buttocks: Both gluteal areas affected: Each measures: 8 cm x 6 cm x 0.2  cm. WOC recommendations: Cleanse buttocks and left hip wounds with NS and pat dry. Apply Gerhardts butt paste twice daily and PRN soilage. No disposable briefs or underpads while in bed.   9)Severe PCM, underweight, hypoalbuminemia- BMI 16 , nutritional supplements vitamins as recommended by dietitian   10)Schizophrenia/Schizoaffective Disorder-  Stable. continue Benztropine,  Thorazine and patient gets Invega shots  11)Tobacco Abuse--- continue Nicotine patch   Code Status: full Family Communication: d/w patient,   Disposition Plan:  home with family  Consultants:  [facility]  Ortho  Procedures:  Rectal tube insertion  Antibiotics:  Zosyn02/28 -->  Vancomycin 03/03 -->  DVT Prophylaxis  : SCDs   Lab Results  Component Value Date   PLT 463 (H) 12/11/2017    Inpatient Medications  Scheduled Meds: . benztropine  0.5 mg Oral QHS  . budesonide  9 mg Oral Daily  . cholestyramine light  4 g Oral 3 times per day  . diphenoxylate-atropine  1 tablet Oral TID  . enoxaparin (LOVENOX) injection  40 mg Subcutaneous Q24H  . feeding supplement (ENSURE ENLIVE)  237 mL Oral Q24H  . folic acid  1 mg Oral Daily  . Gerhardt's butt cream   Topical BID  . mesalamine  4 g Rectal QHS  . metoprolol tartrate  25 mg Oral BID  . multivitamin  1 tablet Oral Daily  . nicotine  21 mg Transdermal Q24H  . sodium bicarbonate  650 mg Oral Daily   Continuous Infusions: . piperacillin-tazobactam (ZOSYN)  IV 3.375 g (12/11/17 1400)  . vancomycin Stopped (12/11/17 1630)   PRN Meds:.acetaminophen **OR** acetaminophen, alum & mag hydroxide-simeth, chlorproMAZINE, ipratropium-albuterol, LORazepam, ondansetron **OR** ondansetron (ZOFRAN) IV, oxyCODONE, sodium chloride    Anti-infectives (From admission, onward)   Start     Dose/Rate Route Frequency Ordered Stop   12/07/17 1500  vancomycin (VANCOCIN) 1,250 mg in sodium chloride 0.9 % 250 mL IVPB     1,250 mg 166.7 mL/hr over 90 Minutes Intravenous Every 24 hours 12/07/17 1403     12/05/17 1800  vancomycin (VANCOCIN) IVPB 750 mg/150 ml premix  Status:  Discontinued     750 mg 150 mL/hr over 60 Minutes Intravenous Every 24 hours 12/04/17 2117 12/05/17 0750   12/05/17 0100  piperacillin-tazobactam (ZOSYN) IVPB 3.375 g     3.375 g 12.5 mL/hr over 240 Minutes Intravenous Every 8 hours 12/04/17 2117     12/04/17  1830  vancomycin (VANCOCIN) IVPB 1000 mg/200 mL premix     1,000 mg 200 mL/hr over 60 Minutes Intravenous  Once 12/04/17 1823 12/04/17 2002   12/04/17 1830  piperacillin-tazobactam (ZOSYN) IVPB 3.375 g     3.375 g 100 mL/hr over 30 Minutes Intravenous  Once 12/04/17 1823 12/04/17 1902        Objective:   Vitals:   12/10/17 1456 12/10/17 2125 12/11/17 0537 12/11/17 1332  BP: (!) 103/56 (!) 115/58 111/73 108/60  Pulse: 98 (!) 105 99 (!) 104  Resp: 18 18 18 18   Temp: 97.9 F (36.6 C) 98.3 F (36.8 C) 97.7 F (36.5 C) 98.4 F (36.9 C)  TempSrc: Oral Oral Oral Oral  SpO2: 98% 97% 100% 98%  Weight:      Height:        Wt Readings from Last 3 Encounters:  12/04/17 54.4 kg (120 lb)  10/03/17 61.7 kg (136 lb)  07/14/17 59.4 kg (131 lb)     Intake/Output Summary (Last 24 hours) at 12/11/2017 1824 Last data filed at 12/11/2017 1424 Gross  per 24 hour  Intake 2020 ml  Output 3500 ml  Net -1480 ml     Physical Exam  Gen:- Awake Alert,  cachetic HEENT:- [place].AT, No sclera icterus Neck-Supple Neck,No JVD,.  Lungs-  CTAB  CV- S1, S2 normal Abd-  +ve B.Sounds, Abd Soft, No tenderness,    Extremity/Skin:-Necrotic changes and frostbite related changes of multiple toes bilaterally Psych-affect is flat Neuro-no new focal deficits, generalized weakness Rectal-rectal tube with liquid stool noted  Data Review:   Micro Results Recent Results (from the past 240 hour(s))  Blood Culture (routine x 2)     Status: None   Collection Time: 12/04/17  6:20 PM  Result Value Ref Range Status   Specimen Description   Final    BLOOD LEFT ARM Performed at [facility] [address] [place], [place] [address]    Special Requests   Final    IN PEDIATRIC BOTTLE Blood Culture adequate volume Performed at [facility] [address], [place], [place] [address]    Culture   Final    NO GROWTH 5 DAYS Performed at [facility], [place] [address],  [place], [place] [address]    Report Status 12/09/2017 FINAL  Final  Blood Culture (routine x 2)     Status: None   Collection Time: 12/04/17  6:46 PM  Result Value Ref Range Status   Specimen Description   Final    BLOOD RIGHT ANTECUBITAL Performed at [facility] [address] [place], [place] [address]    Special Requests   Final    BOTTLES DRAWN AEROBIC AND ANAEROBIC Blood Culture adequate volume Performed at [facility] [address], [place], [place] [address]    Culture   Final    NO GROWTH 5 DAYS Performed at [facility], [place] [address], [place], [place] [address]    Report Status 12/09/2017 FINAL  Final  C difficile quick scan w PCR reflex     Status: None   Collection Time: 12/04/17  7:16 PM  Result Value Ref Range Status   C Diff antigen NEGATIVE NEGATIVE Final   C Diff toxin NEGATIVE NEGATIVE Final   C Diff interpretation No C. difficile detected.  Final    Comment: Performed at [facility], [place] [address] [place], [place] [address]  Gastrointestinal Panel by PCR , Stool     Status: None   Collection Time: 12/04/17  7:16 PM  Result Value Ref Range Status   Campylobacter species NOT DETECTED NOT DETECTED Final   Plesimonas shigelloides NOT DETECTED NOT DETECTED Final   Salmonella species NOT DETECTED NOT DETECTED Final   Yersinia enterocolitica NOT DETECTED NOT DETECTED Final   Vibrio species NOT DETECTED NOT DETECTED Final   Vibrio cholerae NOT DETECTED NOT DETECTED Final   Enteroaggregative E coli (EAEC) NOT DETECTED NOT DETECTED Final   Enteropathogenic E coli (EPEC) NOT DETECTED NOT DETECTED Final   Enterotoxigenic E coli (ETEC) NOT DETECTED NOT DETECTED Final   Shiga like toxin producing E coli (STEC) NOT DETECTED NOT DETECTED Final   Shigella/Enteroinvasive E coli (EIEC) NOT DETECTED NOT DETECTED Final   Cryptosporidium NOT DETECTED NOT DETECTED Final   Cyclospora cayetanensis NOT DETECTED NOT  DETECTED Final   Entamoeba histolytica NOT DETECTED NOT DETECTED Final   Giardia lamblia NOT DETECTED NOT DETECTED Final   Adenovirus F40/41 NOT DETECTED NOT DETECTED Final   Astrovirus NOT DETECTED NOT DETECTED Final  Norovirus GI/GII NOT DETECTED NOT DETECTED Final   Rotavirus A NOT DETECTED NOT DETECTED Final   Sapovirus (I, II, IV, and V) NOT DETECTED NOT DETECTED Final    Comment: Performed at [facility], [place] [place], [place] [address]  MRSA PCR Screening     Status: None   Collection Time: 12/04/17 11:29 PM  Result Value Ref Range Status   MRSA by PCR NEGATIVE NEGATIVE Final    Comment:        The GeneXpert MRSA Assay (FDA approved for NASAL specimens only), is one component of a comprehensive MRSA colonization surveillance program. It is not intended to diagnose MRSA infection nor to guide or monitor treatment for MRSA infections. Performed at [facility], [place] [address], [place], [place] [address]   Culture, Urine     Status: Abnormal   Collection Time: 12/05/17  9:43 AM  Result Value Ref Range Status   Specimen Description   Final    URINE, CLEAN CATCH Performed at [facility], [place] [address] [place], [place] [address]    Special Requests   Final    NONE Performed at [facility], [place] [address], [place], [place] [address]    Culture MULTIPLE SPECIES PRESENT, SUGGEST RECOLLECTION (A)  Final   Report Status 12/06/2017 FINAL  Final    Radiology Reports Dg Chest 2 View  Result Date: 12/04/2017 CLINICAL DATA:  Shortness of breath. EXAM: CHEST  2 VIEW COMPARISON:  08/04/2008. FINDINGS: Mediastinum hilar structures are normal. Heart size normal. No focal infiltrate. No pleural effusion or pneumothorax. Thoracic spine scoliosis. IMPRESSION: No acute cardiopulmonary disease. Electronically Signed   By: Marcello Moores  Register   On: 12/04/2017 16:05   Dg Chest Port 1 View  Result Date:  12/04/2017 CLINICAL DATA:  42 year old male with weight loss, diarrhea, and sepsis. EXAM: PORTABLE CHEST 1 VIEW COMPARISON:  Chest radiograph dated 12/04/2017 FINDINGS: The lungs are clear. There is no pleural effusion pneumothorax. The cardiac silhouette is within normal limits. No acute osseous pathology. IMPRESSION: No active disease. Electronically Signed   By: Anner Crete M.D.   On: 12/04/2017 21:15   Dg Foot Complete Left  Result Date: 12/04/2017 CLINICAL DATA:  Foot pain.  Information. EXAM: LEFT FOOT - COMPLETE 3+ VIEW COMPARISON:  None. FINDINGS: No acute bony abnormality identified. No evidence of fracture dislocation. Mild degenerative changes first MTP joint. No evidence of inflammatory arthropathy. No radiopaque foreign body. IMPRESSION: Mild degenerative changes first MTP joint. No inflammatory arthropathy. No acute bony abnormality. Electronically Signed   By: Marcello Moores  Register   On: 12/04/2017 16:08   Dg Foot Complete Right  Result Date: 12/04/2017 CLINICAL DATA:  Foot pain with inflammation EXAM: RIGHT FOOT COMPLETE - 3+ VIEW COMPARISON:  Left foot radiographs 12/04/2017 FINDINGS: No fracture or malalignment. No definite periostitis or bone destruction. No radiopaque foreign body or soft tissue gas. IMPRESSION: No definite acute osseous abnormality Electronically Signed   By: Donavan Foil M.D.   On: 12/04/2017 16:07     CBC Recent Labs  Lab 12/07/17 0317 12/08/17 0445 12/09/17 0643 12/10/17 0443 12/11/17 0418  WBC 35.0* 26.3* 25.5* 22.3* 20.6*  HGB 7.6* 8.0* 7.8* 7.8* 7.8*  HCT 23.8* 25.8* 25.0* 25.3* 25.6*  PLT 338 418* 441* 453* 463*  MCV 90.5 90.5 90.6 92.7 93.1  MCH 28.9 28.1 28.3 28.6 28.4  MCHC 31.9 31.0 31.2 30.8 30.5  RDW 14.6 14.5 14.6 15.0 15.7*    Chemistries  Recent Labs  Lab 12/05/17 0330 12/06/17 0323 12/07/17 0317 12/08/17 0445 12/10/17 0443 12/11/17 0418  NA 143 138 138 140  --  141  K 4.2 3.4* 3.6 3.8  --  3.8  CL 114* 110 111 112*  --   107  CO2 21* 22 21* 23  --  27  GLUCOSE 71 100* 92 85  --  112*  BUN 35* 17 15 11   --  10  CREATININE 1.29* 1.12 0.86 0.89 0.86 0.85  CALCIUM 7.3* 7.6* 7.5* 7.6*  --  7.8*  AST 176* 133* 97* 78*  --  41  ALT 80* 73* 70* 69*  --  58  ALKPHOS 70 72 75 61  --  63  BILITOT 1.7* 1.4* 0.9 0.7  --  0.5   ------------------------------------------------------------------------------------------------------------------ No results for input(s): CHOL, HDL, LDLCALC, TRIG, CHOLHDL, LDLDIRECT in the last 72 hours.  Lab Results  Component Value Date   HGBA1C 4.9 02/12/2016   ------------------------------------------------------------------------------------------------------------------ No results for input(s): TSH, T4TOTAL, T3FREE, THYROIDAB in the last 72 hours.  Invalid input(s): FREET3 ------------------------------------------------------------------------------------------------------------------ No results for input(s): VITAMINB12, FOLATE, FERRITIN, TIBC, IRON, RETICCTPCT in the last 72 hours.  Coagulation profile No results for input(s): INR, PROTIME in the last 168 hours.  No results for input(s): DDIMER in the last 72 hours.  Cardiac Enzymes Recent Labs  Lab 12/04/17 1953  CKMB 50.2*   ------------------------------------------------------------------------------------------------------------------ No results found for: BNP   Roxan Hockey M.D on 12/11/2017 at 6:24 PM  Between 7am to 7pm - Pager - [phone]  After 7pm go to [email or web address] - password [email or web address]  [facility] -  Office  [phone]   Voice Recognition Viviann Spare dictation system was used to create this note, attempts have been made to correct errors. Please contact the author with questions and/or clarifications.

## 2017-12-12 LAB — CBC
HCT: 28.3 % — ABNORMAL LOW (ref 39.0–52.0)
Hemoglobin: 8.4 g/dL — ABNORMAL LOW (ref 13.0–17.0)
MCH: 28.2 pg (ref 26.0–34.0)
MCHC: 29.7 g/dL — ABNORMAL LOW (ref 30.0–36.0)
MCV: 95 fL (ref 78.0–100.0)
PLATELETS: 513 10*3/uL — AB (ref 150–400)
RBC: 2.98 MIL/uL — AB (ref 4.22–5.81)
RDW: 16.1 % — ABNORMAL HIGH (ref 11.5–15.5)
WBC: 19.2 10*3/uL — ABNORMAL HIGH (ref 4.0–10.5)

## 2017-12-12 LAB — CREATININE, SERUM
CREATININE: 0.85 mg/dL (ref 0.61–1.24)
GFR calc Af Amer: >60 mL/min (ref >60)
GFR calc non Af Amer: >60 mL/min (ref >60)

## 2017-12-12 LAB — CK: Total CK: 305 U/L (ref 49–397)

## 2017-12-12 MED ORDER — SODIUM CHLORIDE 0.9 % IV SOLN
INTRAVENOUS | Status: AC
  Administered 2017-12-12 – 2017-12-13 (×3): via INTRAVENOUS

## 2017-12-12 MED ORDER — SODIUM CHLORIDE 0.9 % IV BOLUS (SEPSIS)
250.0000 mL | Freq: Once | INTRAVENOUS | Status: AC
  Administered 2017-12-12: 250 mL via INTRAVENOUS

## 2017-12-12 NOTE — Progress Notes (Signed)
Patient Demographics:    Christian Lamb, is a 42 y.o. male, DOB - 26-Jan-1976, OAC:[id number]  Admit date - 12/04/2017   Admitting Physician Norval Morton, MD  Outpatient Primary MD for the patient is [facility] Total [facility]  LOS - 8   Chief Complaint  Patient presents with  . Diarrhea  . 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 He has also reported feet were cold and turning dark in color. His caseworker came to see him on the day of the admission and called EMS for pt to be admitted to the hospital.  Assessment/Plan:  1)Sepsis present on admission-  sepsis criteria met on admission, pt has Necrotic toes,  blood cultures neg, urine culture with multiple species present, C. Diff neg, GI stool panel negative. Afebrile,  cont iv Zosyn for possible bowel/intra-abdominal infection  2)BL lower extremity, frostbite the his toes-  Necrotic right second toe, venous Doppler negative for DVTs. No evidence of  osteomyelitis on x ray.  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 LFT elevation likely from sepsis and rhabdomyolysis. hep panel and HIV negative. LFT's overall trending down  6)Schizophrenia/Schizoaffective Disorder-  Stable. continue Benztropine, Thorazine and patient gets Invega shots  7)Chronic Anemia.-Due to poor nutritional intake, and underlying chronic colitis, no evidence of ongoing GI bleed   8)Skin wounds- POA-   Moisture associated skin damage to left hip and buttocks from excessive diarrhea and exposure to urine and stool during acute illness, present on admission.  - Left hip: 22 cm x 3 cm x 0.2 cm. Buttocks: Both gluteal areas affected: Each measures: 8 cm x  6 cm x 0.2 cm. WOC recommendations: Cleanse buttocks and left hip wounds with NS and pat dry. Apply Gerhardts butt paste twice daily and PRN soilage. No disposable briefs or underpads while in bed.   9)Severe PCM, underweight, hypoalbuminemia- BMI 16 , nutritional supplements vitamins as recommended by  dietitian   10)Tobacco Abuse--- continue Nicotine patch  Code Status:full Family Communication:d/w patient, RN (indicate person spoken with, relationship, and if by phone, the number) Disposition Plan:needs SNF. Not medically ready yet   Consultants:  [facility]  Ortho  Procedures:  Rectal tube insertion  Antibiotics:  Zosyn02/28 -->  Vancomycin 03/03 -->   DVT Prophylaxis  :  Lovenox  Lab Results  Component Value Date   PLT 513 (H) 12/12/2017    Inpatient Medications  Scheduled Meds: . benztropine  0.5 mg Oral QHS  . budesonide  9 mg Oral Daily  . cholestyramine light  4 g Oral 3 times per day  . diphenoxylate-atropine  1 tablet Oral TID  . enoxaparin (LOVENOX) injection  40 mg Subcutaneous Q24H  . feeding supplement (ENSURE ENLIVE)  237 mL Oral Q24H  . folic acid  1 mg Oral Daily  . Gerhardt's butt cream   Topical BID  . mesalamine  4 g Rectal QHS  . metoprolol tartrate  25 mg Oral BID  . multivitamin  1 tablet Oral Daily  . nicotine  21 mg Transdermal Q24H  . sodium bicarbonate  650 mg Oral Daily   Continuous Infusions: . sodium chloride 100 mL/hr at 12/12/17 1430  . piperacillin-tazobactam (ZOSYN)  IV Stopped (12/12/17 1751)  . vancomycin 1,250 mg (12/12/17 1730)   PRN Meds:.acetaminophen **OR** acetaminophen, alum & mag hydroxide-simeth, chlorproMAZINE, ipratropium-albuterol, LORazepam, ondansetron **OR** ondansetron (ZOFRAN) IV, oxyCODONE, sodium chloride    Anti-infectives (From admission, onward)   Start     Dose/Rate Route Frequency Ordered Stop   12/07/17 1500  vancomycin (VANCOCIN) 1,250 mg in sodium chloride 0.9 % 250 mL IVPB     1,250 mg 166.7 mL/hr over 90 Minutes Intravenous Every 24 hours 12/07/17 1403 12/13/17 1459   12/05/17 1800  vancomycin (VANCOCIN) IVPB 750 mg/150 ml premix  Status:  Discontinued     750 mg 150 mL/hr over 60 Minutes Intravenous Every 24 hours 12/04/17 2117 12/05/17 0750   12/05/17 0100   piperacillin-tazobactam (ZOSYN) IVPB 3.375 g     3.375 g 12.5 mL/hr over 240 Minutes Intravenous Every 8 hours 12/04/17 2117     12/04/17 1830  vancomycin (VANCOCIN) IVPB 1000 mg/200 mL premix     1,000 mg 200 mL/hr over 60 Minutes Intravenous  Once 12/04/17 1823 12/04/17 2002   12/04/17 1830  piperacillin-tazobactam (ZOSYN) IVPB 3.375 g     3.375 g 100 mL/hr over 30 Minutes Intravenous  Once 12/04/17 1823 12/04/17 1902        Objective:   Vitals:   12/11/17 2105 12/12/17 0528 12/12/17 1100 12/12/17 1408  BP: (!) 117/58 (!) 107/57 (!) 72/50 (!) 100/55  Pulse: 94 93 (!) 128 (!) 109  Resp: _0 Temp: 98.5 F (36.9 C) 98.5 F (36.9 C)  98.7 F (37.1 C)  TempSrc: Oral Oral  Oral  SpO2: 97% 99%  98%  Weight:      Height:        Wt Readings from Last 3 Encounters:  12/04/17 54.4 kg (120 lb)  10/03/17 61.7 kg (136 lb)  07/14/17 59.4 kg (131 lb)     Intake/Output Summary (Last 24 hours) at  12/12/2017 1852 Last data filed at 12/12/2017 1821 Gross per 24 hour  Intake 1180 ml  Output 5650 ml  Net -4470 ml     Physical Exam   Gen:- Awake Alert,  cachetic HEENT:- [place].AT, No sclera icterus Neck-Supple Neck,No JVD,.  Lungs-  CTAB , no wheezing CV- S1, S2 normal Abd-  +ve B.Sounds, Abd Soft, No tenderness,    Extremity/Skin:-Necrotic changes and frostbite related changes of multiple toes bilaterally Psych-affect is flat Neuro-no new focal deficits, generalized weakness Rectal-rectal tube with liquid stool noted   Data Review:   Micro Results Recent Results (from the past 240 hour(s))  Blood Culture (routine x 2)     Status: None   Collection Time: 12/04/17  6:20 PM  Result Value Ref Range Status   Specimen Description   Final    BLOOD LEFT ARM Performed at [facility] [address], [place], [place] [address]    Special Requests   Final    IN PEDIATRIC BOTTLE Blood Culture adequate volume Performed at [facility] [address] [place], [place] [address]    Culture   Final    NO GROWTH 5 DAYS Performed at [facility], [place] [address] [place], [place] [address]    Report Status 12/09/2017 FINAL  Final  Blood Culture (routine x 2)     Status: None   Collection Time: 12/04/17  6:46 PM  Result Value Ref Range Status   Specimen Description   Final    BLOOD RIGHT ANTECUBITAL Performed at [facility] [address], [place], [place] [address]    Special Requests   Final    BOTTLES DRAWN AEROBIC AND ANAEROBIC Blood Culture adequate volume Performed at [facility] [address], [place], [place] [address]    Culture   Final    NO GROWTH 5 DAYS Performed at [facility], [place] [address] [place], [place] [address]    Report Status 12/09/2017 FINAL  Final  C difficile quick scan w PCR reflex     Status: None   Collection Time: 12/04/17  7:16 PM  Result Value Ref Range Status   C Diff antigen NEGATIVE NEGATIVE Final   C Diff toxin NEGATIVE NEGATIVE Final   C Diff interpretation No C. difficile detected.  Final    Comment: Performed at [facility], [place] [address], [place], [place] [address]  Gastrointestinal Panel by PCR , Stool     Status: None   Collection Time: 12/04/17  7:16 PM  Result Value Ref Range Status   Campylobacter species NOT DETECTED NOT DETECTED Final   Plesimonas shigelloides NOT DETECTED NOT DETECTED Final   Salmonella species NOT DETECTED NOT DETECTED Final   Yersinia enterocolitica NOT DETECTED NOT DETECTED Final   Vibrio species NOT DETECTED NOT DETECTED Final   Vibrio cholerae NOT DETECTED NOT DETECTED Final   Enteroaggregative E coli (EAEC) NOT DETECTED NOT DETECTED Final   Enteropathogenic E coli (EPEC) NOT DETECTED NOT DETECTED Final   Enterotoxigenic E coli (ETEC) NOT DETECTED NOT DETECTED Final   Shiga like toxin producing E coli (STEC) NOT DETECTED NOT DETECTED Final   Shigella/Enteroinvasive E  coli (EIEC) NOT DETECTED NOT DETECTED Final   Cryptosporidium NOT DETECTED NOT DETECTED Final   Cyclospora cayetanensis NOT DETECTED NOT DETECTED Final   Entamoeba histolytica NOT DETECTED NOT DETECTED Final   Giardia lamblia NOT DETECTED NOT DETECTED Final   Adenovirus F40/41 NOT  DETECTED NOT DETECTED Final   Astrovirus NOT DETECTED NOT DETECTED Final   Norovirus GI/GII NOT DETECTED NOT DETECTED Final   Rotavirus A NOT DETECTED NOT DETECTED Final   Sapovirus (I, II, IV, and V) NOT DETECTED NOT DETECTED Final    Comment: Performed at [facility], [place] [place], [place] [address]  MRSA PCR Screening     Status: None   Collection Time: 12/04/17 11:29 PM  Result Value Ref Range Status   MRSA by PCR NEGATIVE NEGATIVE Final    Comment:        The GeneXpert MRSA Assay (FDA approved for NASAL specimens only), is one component of a comprehensive MRSA colonization surveillance program. It is not intended to diagnose MRSA infection nor to guide or monitor treatment for MRSA infections. Performed at [facility], [place] [address] [place], [place] [address]   Culture, Urine     Status: Abnormal   Collection Time: 12/05/17  9:43 AM  Result Value Ref Range Status   Specimen Description   Final    URINE, CLEAN CATCH Performed at [facility], [place] [address], [place], [place] [address]    Special Requests   Final    NONE Performed at [facility], [place] [address] [place], [place] [address]    Culture MULTIPLE SPECIES PRESENT, SUGGEST RECOLLECTION (A)  Final   Report Status 12/06/2017 FINAL  Final    Radiology Reports Dg Chest 2 View  Result Date: 12/04/2017 CLINICAL DATA:  Shortness of breath. EXAM: CHEST  2 VIEW COMPARISON:  08/04/2008. FINDINGS: Mediastinum hilar structures are normal. Heart size normal. No focal infiltrate. No pleural effusion or pneumothorax. Thoracic spine scoliosis. IMPRESSION: No acute  cardiopulmonary disease. Electronically Signed   By: Marcello Moores  Register   On: 12/04/2017 16:05   Dg Chest Port 1 View  Result Date: 12/04/2017 CLINICAL DATA:  42 year old male with weight loss, diarrhea, and sepsis. EXAM: PORTABLE CHEST 1 VIEW COMPARISON:  Chest radiograph dated 12/04/2017 FINDINGS: The lungs are clear. There is no pleural effusion pneumothorax. The cardiac silhouette is within normal limits. No acute osseous pathology. IMPRESSION: No active disease. Electronically Signed   By: Anner Crete M.D.   On: 12/04/2017 21:15   Dg Foot Complete Left  Result Date: 12/04/2017 CLINICAL DATA:  Foot pain.  Information. EXAM: LEFT FOOT - COMPLETE 3+ VIEW COMPARISON:  None. FINDINGS: No acute bony abnormality identified. No evidence of fracture dislocation. Mild degenerative changes first MTP joint. No evidence of inflammatory arthropathy. No radiopaque foreign body. IMPRESSION: Mild degenerative changes first MTP joint. No inflammatory arthropathy. No acute bony abnormality. Electronically Signed   By: Marcello Moores  Register   On: 12/04/2017 16:08   Dg Foot Complete Right  Result Date: 12/04/2017 CLINICAL DATA:  Foot pain with inflammation EXAM: RIGHT FOOT COMPLETE - 3+ VIEW COMPARISON:  Left foot radiographs 12/04/2017 FINDINGS: No fracture or malalignment. No definite periostitis or bone destruction. No radiopaque foreign body or soft tissue gas. IMPRESSION: No definite acute osseous abnormality Electronically Signed   By: Donavan Foil M.D.   On: 12/04/2017 16:07     CBC Recent Labs  Lab 12/08/17 0445 12/09/17 0643 12/10/17 0443 12/11/17 0418 12/12/17 0444  WBC 26.3* 25.5* 22.3* 20.6* 19.2*  HGB 8.0* 7.8* 7.8* 7.8* 8.4*  HCT 25.8* 25.0* 25.3* 25.6* 28.3*  PLT 418* 441* 453* 463* 513*  MCV 90.5 90.6 92.7 93.1 95.0  MCH 28.1 28.3 28.6 28.4 28.2  MCHC 31.0 31.2 30.8  30.5 29.7*  RDW 14.5 14.6 15.0 15.7* 16.1*    Chemistries  Recent Labs  Lab 12/06/17 0323 12/07/17 0317  12/08/17 0445 12/10/17 0443 12/11/17 0418 12/12/17 0444  NA 138 138 140  --  141  --   K 3.4* 3.6 3.8  --  3.8  --   CL 110 111 112*  --  107  --   CO2 22 21* 23  --  27  --   GLUCOSE 100* 92 85  --  112*  --   BUN _0 --  10  --   CREATININE 1.12 0.86 0.89 0.86 0.85 0.85  CALCIUM 7.6* 7.5* 7.6*  --  7.8*  --   AST 133* 97* 78*  --  41  --   ALT 73* 70* 69*  --  58  --   ALKPHOS 72 75 61  --  63  --   BILITOT 1.4* 0.9 0.7  --  0.5  --    ------------------------------------------------------------------------------------------------------------------ No results for input(s): CHOL, HDL, LDLCALC, TRIG, CHOLHDL, LDLDIRECT in the last 72 hours.  Lab Results  Component Value Date   HGBA1C 4.9 02/12/2016   ------------------------------------------------------------------------------------------------------------------ No results for input(s): TSH, T4TOTAL, T3FREE, THYROIDAB in the last 72 hours.  Invalid input(s): FREET3 ------------------------------------------------------------------------------------------------------------------ No results for input(s): VITAMINB12, FOLATE, FERRITIN, TIBC, IRON, RETICCTPCT in the last 72 hours.  Coagulation profile No results for input(s): INR, PROTIME in the last 168 hours.  No results for input(s): DDIMER in the last 72 hours.  Cardiac Enzymes No results for input(s): CKMB, TROPONINI, MYOGLOBIN in the last 168 hours.  Invalid input(s): CK ------------------------------------------------------------------------------------------------------------------ No results found for: BNP   Roxan Hockey M.D on 12/12/2017 at 6:52 PM  Between 7am to 7pm - Pager - [phone]  After 7pm go to [email or web address] - password [email or web address]  [facility] -  Office  [phone]   Voice Recognition Viviann Spare dictation system was used to create this note, attempts have been made to correct errors. Please contact the author with questions and/or  clarifications.

## 2017-12-12 NOTE — Progress Notes (Signed)
Spoke with pt's mother concerning discharge plans. She agreed with [facility]. Pt's address is [place], [place].

## 2017-12-12 NOTE — Progress Notes (Signed)
Physical Therapy Treatment Patient Details Name: Christian Lamb MRN: [id number] DOB: [birth year] Today's Date: 12/12/2017    History of Present Illness Christian Lamb is a 42 y.o. 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  Patient  found to also have excoriated buttock s from diarrhea, also with darkened toes, for ABI's to eavaluate. negative DVT.    PT Comments    Assisted OOB to amb in hallway with walker.  Very unsteady gait and excessive lean on walker due to B feet impaired circulation   Follow Up Recommendations  SNF;Supervision/Assistance - 24 hour     Equipment Recommendations  Other (comment)    Recommendations for Other Services       Precautions / Restrictions Precautions Precautions: Fall Precaution Comments: rectal tube and B LE s/p frost bite Restrictions Weight Bearing Restrictions: No    Mobility  Bed Mobility Overal bed mobility: Needs Assistance Bed Mobility: Supine to Sit     Supine to sit: Min guard     General bed mobility comments: used rail to lift trunk, able to move feet off bed with minguard assist  Transfers Overall transfer level: Needs assistance Equipment used: Rolling walker (2 wheeled) Transfers: Sit to/from [facility] Sit to Stand: Min assist;Mod assist Stand pivot transfers: Min assist;Mod assist       General transfer comment: 50% VC's on proper hand placement and turn completion.  VC's for safety  Ambulation/Gait Ambulation/Gait assistance: Min assist Ambulation Distance (Feet): 45 Feet Assistive device: Rolling walker (2 wheeled) Gait Pattern/deviations: Step-through pattern;Staggering left;Staggering right Gait velocity: decreased   General Gait Details:  very unsteady gait with lean on walker.     Stairs            Wheelchair Mobility    Modified Rankin (Stroke Patients Only)       Balance                                            Cognition Arousal/Alertness: Awake/alert Behavior During Therapy: WFL for tasks assessed/performed Overall Cognitive Status: Within Functional Limits for tasks assessed                                 General Comments: required MAX encouragement      Exercises      General Comments        Pertinent Vitals/Pain Pain Location: B feet Pain Descriptors / Indicators: Burning;Discomfort;Grimacing;Guarding    Home Living                      Prior Function            PT Goals (current goals can now be found in the care plan section) Progress towards PT goals: Progressing toward goals    Frequency    Min 2X/week      PT Plan Current plan remains appropriate    Co-evaluation              AM-PAC PT "6 Clicks" Daily Activity  Outcome Measure  Difficulty turning over in bed (including adjusting bedclothes, sheets and blankets)?: A Lot Difficulty moving from lying on back to sitting  on the side of the bed? : A Lot Difficulty sitting down on and standing up from a chair with arms (e.g., wheelchair, bedside commode, etc,.)?: A Lot Help needed moving to and from a bed to chair (including a wheelchair)?: A Lot Help needed walking in hospital room?: A Lot Help needed climbing 3-5 steps with a railing? : Total 6 Click Score: 11    End of Session Equipment Utilized During Treatment: Gait belt Activity Tolerance: Patient limited by fatigue Patient left: with chair alarm set;in chair;with call bell/phone within reach Nurse Communication: Mobility status PT Visit Diagnosis: Unsteadiness on feet (R26.81);Muscle weakness (generalized) (M62.81) Pain - Right/Left: Left Pain - part of body: Ankle and joints of foot     Time:  1015-1030 PT Time Calculation (min) (ACUTE ONLY): 15 min  Charges:  $Gait Training: 8-22 mins                    G Codes:       Rica Koyanagi  PTA [facility]  Acute  Rehab Pager      [phone]

## 2017-12-13 LAB — CBC
HCT: 27.6 % — ABNORMAL LOW (ref 39.0–52.0)
Hemoglobin: 8.4 g/dL — ABNORMAL LOW (ref 13.0–17.0)
MCH: 28.5 pg (ref 26.0–34.0)
MCHC: 30.4 g/dL (ref 30.0–36.0)
MCV: 93.6 fL (ref 78.0–100.0)
PLATELETS: 472 10*3/uL — AB (ref 150–400)
RBC: 2.95 MIL/uL — AB (ref 4.22–5.81)
RDW: 16.4 % — ABNORMAL HIGH (ref 11.5–15.5)
WBC: 13 10*3/uL — AB (ref 4.0–10.5)

## 2017-12-13 LAB — BASIC METABOLIC PANEL
ANION GAP: 5 (ref 5–15)
BUN: 11 mg/dL (ref 6–20)
CO2: 27 mmol/L (ref 22–32)
Calcium: 8.1 mg/dL — ABNORMAL LOW (ref 8.9–10.3)
Chloride: 109 mmol/L (ref 101–111)
Creatinine, Ser: 0.85 mg/dL (ref 0.61–1.24)
Glucose, Bld: 85 mg/dL (ref 65–99)
POTASSIUM: 3.9 mmol/L (ref 3.5–5.1)
SODIUM: 141 mmol/L (ref 135–145)

## 2017-12-13 LAB — COMPREHENSIVE METABOLIC PANEL
ALBUMIN: 1.5 g/dL — AB (ref 3.5–5.0)
ALT: 46 U/L (ref 17–63)
AST: 25 U/L (ref 15–41)
Alkaline Phosphatase: 65 U/L (ref 38–126)
Anion gap: 6 (ref 5–15)
BUN: 10 mg/dL (ref 6–20)
CHLORIDE: 107 mmol/L (ref 101–111)
CO2: 27 mmol/L (ref 22–32)
CREATININE: 0.76 mg/dL (ref 0.61–1.24)
Calcium: 7.7 mg/dL — ABNORMAL LOW (ref 8.9–10.3)
GFR calc Af Amer: >60 mL/min (ref >60)
GFR calc non Af Amer: >60 mL/min (ref >60)
Glucose, Bld: 82 mg/dL (ref 65–99)
Potassium: 3.7 mmol/L (ref 3.5–5.1)
SODIUM: 140 mmol/L (ref 135–145)
Total Bilirubin: 0.2 mg/dL — ABNORMAL LOW (ref 0.3–1.2)
Total Protein: 6.9 g/dL (ref 6.5–8.1)

## 2017-12-13 LAB — CK: Total CK: 193 U/L (ref 49–397)

## 2017-12-13 MED ORDER — LOPERAMIDE HCL 2 MG PO CAPS
2.0000 mg | ORAL_CAPSULE | ORAL | Status: DC | PRN
  Administered 2017-12-14: 2 mg via ORAL
  Filled 2017-12-13: qty 1

## 2017-12-13 MED ORDER — SODIUM BICARBONATE 650 MG PO TABS
650.0000 mg | ORAL_TABLET | Freq: Every day | ORAL | Status: DC
  Administered 2017-12-13 – 2017-12-18 (×6): 650 mg via ORAL
  Filled 2017-12-13 (×6): qty 1

## 2017-12-13 MED ORDER — LOPERAMIDE HCL 2 MG PO CAPS
4.0000 mg | ORAL_CAPSULE | Freq: Once | ORAL | Status: AC
  Administered 2017-12-13: 4 mg via ORAL
  Filled 2017-12-13: qty 2

## 2017-12-13 MED ORDER — DIPHENOXYLATE-ATROPINE 2.5-0.025 MG PO TABS
1.0000 | ORAL_TABLET | Freq: Four times a day (QID) | ORAL | Status: DC
  Administered 2017-12-13 – 2017-12-18 (×20): 1 via ORAL
  Filled 2017-12-13 (×21): qty 1

## 2017-12-13 MED ORDER — HYDROCORTISONE NA SUCCINATE PF 100 MG IJ SOLR
50.0000 mg | Freq: Two times a day (BID) | INTRAMUSCULAR | Status: AC
  Administered 2017-12-13 – 2017-12-14 (×3): 50 mg via INTRAVENOUS
  Filled 2017-12-13 (×3): qty 2

## 2017-12-13 MED ORDER — SODIUM BICARBONATE 650 MG PO TABS: 650.0000 mg | ORAL_TABLET | Freq: Two times a day (BID) | ORAL | Status: DC

## 2017-12-13 NOTE — Progress Notes (Signed)
Patient Demographics:    Christian Lamb, is a 42 y.o. male, DOB - [birth year], IRJ:[id number]  Admit date - 12/04/2017   Admitting Physician Norval Morton, MD  Outpatient Primary MD for the patient is [name]  LOS - 9   Chief Complaint  Patient presents with  . Diarrhea  . 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 He has also reported feet were cold and turning dark in color. His caseworker came to see him on the day of the admission and called EMS for pt to be admitted to the hospital.  Assessment/Plan:  1)Sepsis present on admission-  sepsis criteria met on admission, pt has Necrotic toes,  blood cultures neg, urine culture with multiple species present, C. Diff neg, GI stool panel negative. Afebrile,  cont iv Zosyn for possible bowel/intra-abdominal infection, BBC is down to 13, please note that the patient is on steroids, Solu-Cortef added on 12/13/17 due to hemodynamic concerns and  the patient was chronically on steroids so white count may actually go higher  2)BL lower extremity, frostbite the his toes-  Necrotic right second toe, venous Doppler negative for DVTs. No evidence of osteomyelitis on x ray.  Continue IV Zosyn, orthopedics is following, recommended allowing to demarcate prior to further surgical recommendations, with subsequent outpatient follow-up with orthopedics to determine level of amputation  3)AKI superimposed on CKD 3--    resolved rhabdomyolysis , CKs have normalized, electrolytes and renal function is normalized,  continue IV fluids due to soft BP ,  metabolic acidosis induced by chronic diarrhea has resolved, patient is on bicarb tablets which  helped  4)Ulcerative colitis with chronic Diarrhea- Diarrhea persist, change Lomotil to 4 times daily, use Imodium and cholestyramine as prescribed 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 LFT elevation likely from sepsis and rhabdomyolysis. hep panel and HIV negative. LFT's overall trending down  6)Schizophrenia/Schizoaffective Disorder-  Stable. continue Benztropine, Thorazine and patient gets Invega shots, next dose 12/16/17   7)Chronic Anemia.-Due to poor nutritional intake, and underlying chronic colitis, no evidence  of ongoing GI bleed , Hgb is stable at 8.4  8)Skin wounds- POA-   Moisture associated skin damage to left hip and buttocks from excessive diarrhea and exposure to urine and stool during acute illness, present on admission.  - Left hip: 22 cm x 3 cm x 0.2 cm. Buttocks: Both gluteal areas affected: Each measures: 8 cm x 6 cm x 0.2  cm. WOC recommendations: Cleanse buttocks and left hip wounds with NS and pat dry. Apply Gerhardts butt paste twice daily and PRN soilage. No disposable briefs or underpads while in bed.   9)Severe PCM, underweight, hypoalbuminemia- BMI 16 , nutritional supplements vitamins as recommended by dietitian , albumin is 1.5  10)Tobacco Abuse--- continue Nicotine patch  11)Disposition-patient and mom declined transfer to skilled nursing facility for rehab, they want discharge [facility] with [facility]  Code Status:full Family Communication:d/w patient,  Disposition Plan:needs SNF. Not medically ready yet   Consultants:  [facility]  Ortho  Procedures:  Rectal tube insertion  Antibiotics:  Zosyn02/28 -->  Vancomycin 03/03 -->   DVT Prophylaxis  :  Lovenox  Lab Results  Component Value Date   PLT 472 (H) 12/13/2017    Inpatient Medications  Scheduled Meds: . benztropine  0.5 mg Oral QHS  . budesonide  9 mg Oral Daily  . cholestyramine light  4 g Oral 3 times per day  . diphenoxylate-atropine  1 tablet Oral QID  . enoxaparin (LOVENOX) injection  40 mg Subcutaneous Q24H  . feeding supplement (ENSURE ENLIVE)  237 mL Oral Q24H  . folic acid  1 mg Oral Daily  . Gerhardt's butt cream   Topical BID  . hydrocortisone sod succinate (SOLU-CORTEF) inj  50 mg Intravenous Q12H  . loperamide  4 mg Oral Once  . mesalamine  4 g Rectal QHS  . multivitamin  1 tablet Oral Daily  . nicotine  21 mg Transdermal Q24H  . sodium bicarbonate  650 mg Oral Daily   Continuous Infusions: . piperacillin-tazobactam (ZOSYN)  IV 3.375 g (12/13/17 1306)   PRN Meds:.acetaminophen **OR** acetaminophen, chlorproMAZINE, ipratropium-albuterol, loperamide, LORazepam, ondansetron **OR** ondansetron (ZOFRAN) IV, oxyCODONE, sodium chloride    Anti-infectives (From admission, onward)   Start     Dose/Rate Route Frequency Ordered Stop   12/07/17 1500  vancomycin  (VANCOCIN) 1,250 mg in sodium chloride 0.9 % 250 mL IVPB     1,250 mg 166.7 mL/hr over 90 Minutes Intravenous Every 24 hours 12/07/17 1403 12/12/17 2011   12/05/17 1800  vancomycin (VANCOCIN) IVPB 750 mg/150 ml premix  Status:  Discontinued     750 mg 150 mL/hr over 60 Minutes Intravenous Every 24 hours 12/04/17 2117 12/05/17 0750   12/05/17 0100  piperacillin-tazobactam (ZOSYN) IVPB 3.375 g     3.375 g 12.5 mL/hr over 240 Minutes Intravenous Every 8 hours 12/04/17 2117 12/14/17 0559   12/04/17 1830  vancomycin (VANCOCIN) IVPB 1000 mg/200 mL premix     1,000 mg 200 mL/hr over 60 Minutes Intravenous  Once 12/04/17 1823 12/04/17 2002   12/04/17 1830  piperacillin-tazobactam (ZOSYN) IVPB 3.375 g     3.375 g 100 mL/hr over 30 Minutes Intravenous  Once 12/04/17 1823 12/04/17 1902        Objective:   Vitals:   12/12/17 2113 12/13/17 0530 12/13/17 1445 12/13/17 1524  BP: 119/77 120/65 96/62 107/67  Pulse: (!) 102 100 (!) 108   Resp: _0 Temp: 98.3 F (36.8 C) 98.2 F (36.8  C) 98.6 F (37 C)   TempSrc: Oral Oral Oral   SpO2: 98% 98% 100%   Weight:      Height:        Wt Readings from Last 3 Encounters:  12/04/17 54.4 kg (120 lb)  10/03/17 61.7 kg (136 lb)  07/14/17 59.4 kg (131 lb)     Intake/Output Summary (Last 24 hours) at 12/13/2017 1655 Last data filed at 12/13/2017 1500 Gross per 24 hour  Intake 2306.67 ml  Output 5200 ml  Net -2893.33 ml     Physical Exam   Gen:- Awake Alert,  cachetic HEENT:- [place].AT, No sclera icterus Neck-Supple Neck,No JVD,.  Lungs-  CTAB , no wheezing CV- S1, S2 normal Abd-  +ve B.Sounds, Abd Soft, No tenderness,    Extremity/Skin:-Necrotic changes and frostbite related changes of multiple toes bilaterally Psych-affect is appropriate, oriented x3 Neuro-no new focal deficits, generalized weakness Rectal-rectal tube with liquid stool noted   Data Review:   Micro Results Recent Results (from the past 240 hour(s))  Blood Culture  (routine x 2)     Status: None   Collection Time: 12/04/17  6:20 PM  Result Value Ref Range Status   Specimen Description   Final    BLOOD LEFT ARM Performed at [facility] [address] [place], [place] [address]    Special Requests   Final    IN PEDIATRIC BOTTLE Blood Culture adequate volume Performed at [facility] [address] [place], [place] [address]    Culture   Final    NO GROWTH 5 DAYS Performed at [facility], [place] [address] [place], [place] [address]    Report Status 12/09/2017 FINAL  Final  Blood Culture (routine x 2)     Status: None   Collection Time: 12/04/17  6:46 PM  Result Value Ref Range Status   Specimen Description   Final    BLOOD RIGHT ANTECUBITAL Performed at [facility] [address], [place], [place] [address]    Special Requests   Final    BOTTLES DRAWN AEROBIC AND ANAEROBIC Blood Culture adequate volume Performed at [facility] [address] [place], [place] [address]    Culture   Final    NO GROWTH 5 DAYS Performed at [facility], [place] [address], [place], [place] [address]    Report Status 12/09/2017 FINAL  Final  C difficile quick scan w PCR reflex     Status: None   Collection Time: 12/04/17  7:16 PM  Result Value Ref Range Status   C Diff antigen NEGATIVE NEGATIVE Final   C Diff toxin NEGATIVE NEGATIVE Final   C Diff interpretation No C. difficile detected.  Final    Comment: Performed at [facility], [place] [address], [place], [place] [address]  Gastrointestinal Panel by PCR , Stool     Status: None   Collection Time: 12/04/17  7:16 PM  Result Value Ref Range Status   Campylobacter species NOT DETECTED NOT DETECTED Final   Plesimonas shigelloides NOT DETECTED NOT DETECTED Final   Salmonella species NOT DETECTED NOT DETECTED Final   Yersinia enterocolitica NOT DETECTED NOT DETECTED Final   Vibrio species NOT  DETECTED NOT DETECTED Final   Vibrio cholerae NOT DETECTED NOT DETECTED Final   Enteroaggregative E coli (EAEC) NOT DETECTED NOT DETECTED Final   Enteropathogenic E coli (EPEC) NOT DETECTED NOT DETECTED Final   Enterotoxigenic E coli (ETEC) NOT  DETECTED NOT DETECTED Final   Shiga like toxin producing E coli (STEC) NOT DETECTED NOT DETECTED Final   Shigella/Enteroinvasive E coli (EIEC) NOT DETECTED NOT DETECTED Final   Cryptosporidium NOT DETECTED NOT DETECTED Final   Cyclospora cayetanensis NOT DETECTED NOT DETECTED Final   Entamoeba histolytica NOT DETECTED NOT DETECTED Final   Giardia lamblia NOT DETECTED NOT DETECTED Final   Adenovirus F40/41 NOT DETECTED NOT DETECTED Final   Astrovirus NOT DETECTED NOT DETECTED Final   Norovirus GI/GII NOT DETECTED NOT DETECTED Final   Rotavirus A NOT DETECTED NOT DETECTED Final   Sapovirus (I, II, IV, and V) NOT DETECTED NOT DETECTED Final    Comment: Performed at [facility], [place] [place], [place] [address]  MRSA PCR Screening     Status: None   Collection Time: 12/04/17 11:29 PM  Result Value Ref Range Status   MRSA by PCR NEGATIVE NEGATIVE Final    Comment:        The GeneXpert MRSA Assay (FDA approved for NASAL specimens only), is one component of a comprehensive MRSA colonization surveillance program. It is not intended to diagnose MRSA infection nor to guide or monitor treatment for MRSA infections. Performed at [facility], [place] [address], [place], [place] [address]   Culture, Urine     Status: Abnormal   Collection Time: 12/05/17  9:43 AM  Result Value Ref Range Status   Specimen Description   Final    URINE, CLEAN CATCH Performed at [facility], [place] [address] [place], [place] [address]    Special Requests   Final    NONE Performed at [facility], [place] [address], [place], [place] [address]    Culture MULTIPLE SPECIES PRESENT, SUGGEST  RECOLLECTION (A)  Final   Report Status 12/06/2017 FINAL  Final    Radiology Reports Dg Chest 2 View  Result Date: 12/04/2017 CLINICAL DATA:  Shortness of breath. EXAM: CHEST  2 VIEW COMPARISON:  08/04/2008. FINDINGS: Mediastinum hilar structures are normal. Heart size normal. No focal infiltrate. No pleural effusion or pneumothorax. Thoracic spine scoliosis. IMPRESSION: No acute cardiopulmonary disease. Electronically Signed   By: Marcello Moores  Register   On: 12/04/2017 16:05   Dg Chest Port 1 View  Result Date: 12/04/2017 CLINICAL DATA:  42 year old male with weight loss, diarrhea, and sepsis. EXAM: PORTABLE CHEST 1 VIEW COMPARISON:  Chest radiograph dated 12/04/2017 FINDINGS: The lungs are clear. There is no pleural effusion pneumothorax. The cardiac silhouette is within normal limits. No acute osseous pathology. IMPRESSION: No active disease. Electronically Signed   By: Anner Crete M.D.   On: 12/04/2017 21:15   Dg Foot Complete Left  Result Date: 12/04/2017 CLINICAL DATA:  Foot pain.  Information. EXAM: LEFT FOOT - COMPLETE 3+ VIEW COMPARISON:  None. FINDINGS: No acute bony abnormality identified. No evidence of fracture dislocation. Mild degenerative changes first MTP joint. No evidence of inflammatory arthropathy. No radiopaque foreign body. IMPRESSION: Mild degenerative changes first MTP joint. No inflammatory arthropathy. No acute bony abnormality. Electronically Signed   By: Marcello Moores  Register   On: 12/04/2017 16:08   Dg Foot Complete Right  Result Date: 12/04/2017 CLINICAL DATA:  Foot pain with inflammation EXAM: RIGHT FOOT COMPLETE - 3+ VIEW COMPARISON:  Left foot radiographs 12/04/2017 FINDINGS: No fracture or malalignment. No definite periostitis or bone destruction. No radiopaque foreign body or soft tissue gas. IMPRESSION: No definite acute osseous abnormality Electronically Signed   By: Madie Reno.D.  On: 12/04/2017 16:07     CBC Recent Labs  Lab 12/09/17 0643  12/10/17 0443 12/11/17 0418 12/12/17 0444 12/13/17 0457  WBC 25.5* 22.3* 20.6* 19.2* 13.0*  HGB 7.8* 7.8* 7.8* 8.4* 8.4*  HCT 25.0* 25.3* 25.6* 28.3* 27.6*  PLT 441* 453* 463* 513* 472*  MCV 90.6 92.7 93.1 95.0 93.6  MCH 28.3 28.6 28.4 28.2 28.5  MCHC 31.2 30.8 30.5 29.7* 30.4  RDW 14.6 15.0 15.7* 16.1* 16.4*    Chemistries  Recent Labs  Lab 12/07/17 0317 12/08/17 0445 12/10/17 0443 12/11/17 0418 12/12/17 0444 12/13/17 0457 12/13/17 1217  NA 138 140  --  141  --  141 140  K 3.6 3.8  --  3.8  --  3.9 3.7  CL 111 112*  --  107  --  109 107  CO2 21* 23  --  27  --  27 27  GLUCOSE 92 85  --  112*  --  85 82  BUN 15 11  --  10  --  11 10  CREATININE 0.86 0.89 0.86 0.85 0.85 0.85 0.76  CALCIUM 7.5* 7.6*  --  7.8*  --  8.1* 7.7*  AST 97* 78*  --  41  --   --  25  ALT 70* 69*  --  58  --   --  46  ALKPHOS 75 61  --  63  --   --  65  BILITOT 0.9 0.7  --  0.5  --   --  0.2*   ------------------------------------------------------------------------------------------------------------------ No results for input(s): CHOL, HDL, LDLCALC, TRIG, CHOLHDL, LDLDIRECT in the last 72 hours.  Lab Results  Component Value Date   HGBA1C 4.9 02/12/2016   ------------------------------------------------------------------------------------------------------------------ No results for input(s): TSH, T4TOTAL, T3FREE, THYROIDAB in the last 72 hours.  Invalid input(s): FREET3 ------------------------------------------------------------------------------------------------------------------ No results for input(s): VITAMINB12, FOLATE, FERRITIN, TIBC, IRON, RETICCTPCT in the last 72 hours.  Coagulation profile No results for input(s): INR, PROTIME in the last 168 hours.  No results for input(s): DDIMER in the last 72 hours.  Cardiac Enzymes No results for input(s): CKMB, TROPONINI, MYOGLOBIN in the last 168 hours.  Invalid input(s):  CK ------------------------------------------------------------------------------------------------------------------ No results found for: BNP   Roxan Hockey M.D on 12/13/2017 at 4:55 PM  Between 7am to 7pm - Pager - [phone]  After 7pm go to [email or web address] - password [email or web address]  [facility] -  Office  [phone]   Voice Recognition Viviann Spare dictation system was used to create this note, attempts have been made to correct errors. Please contact the author with questions and/or clarifications.

## 2017-12-14 LAB — CBC
HEMATOCRIT: 30.6 % — AB (ref 39.0–52.0)
Hemoglobin: 9.2 g/dL — ABNORMAL LOW (ref 13.0–17.0)
MCH: 28.4 pg (ref 26.0–34.0)
MCHC: 30.1 g/dL (ref 30.0–36.0)
MCV: 94.4 fL (ref 78.0–100.0)
PLATELETS: 518 10*3/uL — AB (ref 150–400)
RBC: 3.24 MIL/uL — ABNORMAL LOW (ref 4.22–5.81)
RDW: 17 % — AB (ref 11.5–15.5)
WBC: 12.6 10*3/uL — ABNORMAL HIGH (ref 4.0–10.5)

## 2017-12-14 MED ORDER — TRAZODONE HCL 50 MG PO TABS
50.0000 mg | ORAL_TABLET | Freq: Once | ORAL | Status: AC
  Administered 2017-12-14: 50 mg via ORAL
  Filled 2017-12-14: qty 1

## 2017-12-14 MED ORDER — BENZTROPINE MESYLATE 0.5 MG PO TABS
0.5000 mg | ORAL_TABLET | Freq: Once | ORAL | Status: AC
  Administered 2017-12-14: 0.5 mg via ORAL

## 2017-12-14 NOTE — Progress Notes (Addendum)
Patient Demographics:    Christian Lamb, is a 42 y.o. male, DOB - [birth year], SXQ:[id number]  Admit date - 12/04/2017   Admitting Physician Norval Morton, MD  Outpatient Primary MD for the patient is [name]  LOS - 10   Chief Complaint  Patient presents with  . Diarrhea  . Shortness of Breath        Subjective:    Christian Lamb today has no fevers, no emesis,  No chest pain, BP is better, tachycardia persist (at baseline), oral intake is fair, diarrhea is slightly better, spoke with his mother at [phone]-   Assessment  & Plan :    Principal Problem:   Sepsis ([name]) Active Problems:   Schizoaffective disorder, bipolar type ([name])   Tobacco use disorder   Ulcerative colitis with complication ([name])   Anemia of chronic disease   AKI (acute kidney injury) ([name])   Rhabdomyolysis   Elevated liver enzymes   Protein-calorie malnutrition, severe   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 He has also reported feet were cold and turning dark in color. His caseworker came to see him on the day of the admission and called EMS for pt to be admitted to the hospital.  Assessment/Plan:  1)Sepsis Present on Admission-  sepsis criteria met on admission, pt has Necrotic toes,  blood cultures neg, urine culture with multiple species present, C. Diff neg, GI stool panel negative. Afebrile,  Treated with iv Zosyn for possible bowel/intra-abdominal infection, WBC is down to 12k from a peak of 32  k, please note that the patient is on steroids,    2)BL Lower Extremity, Frostbite the his Toes-  Necrotic right second toe, venous Doppler negative for DVTs. No evidence of osteomyelitis on x ray.  Was Treated with IV Zosyn, orthopedic consult appreciated , recommended allowing demarcation of necrotic areas prior to further surgical recommendations, with subsequent outpatient follow-up with orthopedics to determine level of amputation  3)AKI superimposed on CKD 3--    resolved rhabdomyolysis , CKs have normalized, electrolytes and renal function is normalized,  continue IV fluids due to soft BP ,  metabolic acidosis induced by chronic diarrhea has resolved, patient is on bicarb tablets which  helped  4)Ulcerative colitis with chronic Diarrhea- Diarrhea is slowly improving,  Lomotil was changed to 4 times daily, use Imodium and cholestyramine as prescribed 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 LFT elevation likely from sepsis and rhabdomyolysis. hep panel and HIV negative. LFT's normalized  6)Schizophrenia/Schizoaffective Disorder-  Stable. continue Benztropine, Thorazine and patient gets Invega shots, next dose of Invega is 12/16/17  7)Chronic Anemia.-Due to poor nutritional intake, and underlying chronic colitis, no evidence of ongoing GI bleed , Hgb is stable above 8  8)Skin wounds- POA-   Moisture associated skin damage to left hip and buttocks from excessive diarrhea and exposure to urine and stool during acute illness, present on admission.  - Left hip: 22 cm x 3 cm x 0.2 cm. Buttocks: Both gluteal areas  affected: Each measures: 8 cm x 6 cm x 0.2 cm. WOC recommendations: Cleanse buttocks and left hip wounds with NS and pat dry. Apply Gerhardts butt paste twice daily and PRN soilage. No disposable briefs or underpads while in bed.   9)Severe PCM, underweight, hypoalbuminemia- BMI 16 , nutritional supplements vitamins as recommended by dietitian , albumin is 1.5  10)Tobacco Abuse--- continue Nicotine patch  11)Disposition-patient and mom  repeatedly declined transfer to skilled nursing facility for rehab, they want discharge home with family with [facility], Pt was having very profuse/large volume watery diarrhea, high risk for dehydration, electrolyte abnormalities and acute kidney injury, with changes to Lomotil and Imodium,  diarrhea is slowly and slightly improving, may discharge home when volume and frequency of diarrhea improves enough to minimize his risk for significant dehydration, electrolyte abnormalities and acute kidney injury. D/w  his mother at [phone]    Code Status:full Family Communication:d/w patient, spoke with his mother at [phone]---  Disposition Plan: Home with Home health,  Not medically ready yet, spoke with his mother at [phone]---   Consultants:  [facility]  Ortho  Procedures:  Rectal tube insertion  Antibiotics:  Zosyn02/28 -->  Vancomycin 03/03 -->  DVT Prophylaxis  :  Lovenox  Lab Results  Component Value Date   PLT 518 (H) 12/14/2017   Inpatient Medications  Scheduled Meds: . benztropine  0.5 mg Oral QHS  . budesonide  9 mg Oral Daily  . cholestyramine light  4 g Oral 3 times per day  . diphenoxylate-atropine  1 tablet Oral QID  . enoxaparin (LOVENOX) injection  40 mg Subcutaneous Q24H  . feeding supplement (ENSURE ENLIVE)  237 mL Oral Q24H  . folic acid  1 mg Oral Daily  . Gerhardt's butt cream   Topical BID  . mesalamine  4 g Rectal QHS  . multivitamin  1 tablet Oral Daily  . nicotine  21 mg  Transdermal Q24H  . sodium bicarbonate  650 mg Oral Daily   Continuous Infusions:  PRN Meds:.acetaminophen **OR** acetaminophen, chlorproMAZINE, ipratropium-albuterol, loperamide, LORazepam, ondansetron **OR** ondansetron (ZOFRAN) IV, oxyCODONE, sodium chloride    Anti-infectives (From admission, onward)   Start     Dose/Rate Route Frequency Ordered Stop   12/07/17 1500  vancomycin (VANCOCIN) 1,250 mg in sodium chloride 0.9 % 250 mL IVPB     1,250 mg 166.7 mL/hr over 90 Minutes Intravenous Every 24 hours 12/07/17 1403 12/12/17 2011   12/05/17 1800  vancomycin (VANCOCIN) IVPB 750 mg/150 ml premix  Status:  Discontinued     750 mg 150 mL/hr over 60 Minutes Intravenous Every 24 hours 12/04/17 2117 12/05/17 0750   12/05/17 0100  piperacillin-tazobactam (ZOSYN) IVPB 3.375 g     3.375 g 12.5 mL/hr over 240 Minutes Intravenous Every 8 hours 12/04/17 2117 12/14/17 0559   12/04/17 1830  vancomycin (VANCOCIN) IVPB 1000 mg/200 mL premix     1,000 mg 200 mL/hr over 60 Minutes Intravenous  Once 12/04/17 1823 12/04/17 2002   12/04/17 1830  piperacillin-tazobactam (ZOSYN) IVPB 3.375 g     3.375 g 100  mL/hr over 30 Minutes Intravenous  Once 12/04/17 1823 12/04/17 1902        Objective:   Vitals:   12/13/17 1524 12/13/17 2023 12/14/17 0447 12/14/17 1310  BP: 107/67 100/64 107/73 113/68  Pulse:  100 (!) 116 (!) 125  Resp:  18 18 16   Temp:  98.2 F (36.8 C) 98.4 F (36.9 C) 98.4 F (36.9 C)  TempSrc:  Oral Oral Oral  SpO2:  100% 99% 98%  Weight:      Height:        Wt Readings from Last 3 Encounters:  12/04/17 54.4 kg (120 lb)  10/03/17 61.7 kg (136 lb)  07/14/17 59.4 kg (131 lb)     Intake/Output Summary (Last 24 hours) at 12/14/2017 1747 Last data filed at 12/14/2017 1515 Gross per 24 hour  Intake 480 ml  Output 5150 ml  Net -4670 ml    Physical Exam  Gen:- Awake Alert,  cachetic HEENT:- [place].AT, No sclera icterus Neck-Supple Neck,No JVD,.  Lungs-  CTAB , no wheezing CV-  S1, S2 normal, HR 108 Abd-  +ve B.Sounds, Abd Soft, No tenderness,    Extremity/Skin:-Necrotic changes and frostbite related changes of multiple toes bilaterally Psych-affect is appropriate, oriented x3 Neuro-no new focal deficits, generalized weakness Rectal-rectal tube with liquid stool noted   Data Review:   Micro Results Recent Results (from the past 240 hour(s))  Blood Culture (routine x 2)     Status: None   Collection Time: 12/04/17  6:20 PM  Result Value Ref Range Status   Specimen Description   Final    BLOOD LEFT ARM Performed at [facility] [address] [place], [place] [address]    Special Requests   Final    IN PEDIATRIC BOTTLE Blood Culture adequate volume Performed at [facility] [address], [place], [place] [address]    Culture   Final    NO GROWTH 5 DAYS Performed at [facility], [place] [address], [place], [place] [address]    Report Status 12/09/2017 FINAL  Final  Blood Culture (routine x 2)     Status: None   Collection Time: 12/04/17  6:46 PM  Result Value Ref Range Status   Specimen Description   Final    BLOOD RIGHT ANTECUBITAL Performed at [facility] [address], [place], [place] [address]    Special Requests   Final    BOTTLES DRAWN AEROBIC AND ANAEROBIC Blood Culture adequate volume Performed at [facility] [address], [place], [place] [address]    Culture   Final    NO GROWTH 5 DAYS Performed at [facility], [place] [address] [place],  [address]    Report Status 12/09/2017 FINAL  Final  C difficile quick scan w PCR reflex     Status: None   Collection Time: 12/04/17  7:16 PM  Result Value Ref Range Status   C Diff antigen NEGATIVE NEGATIVE Final   C Diff toxin NEGATIVE NEGATIVE Final   C Diff interpretation No C. difficile detected.  Final    Comment: Performed at [facility], [place] [address],  [place],  [address]  Gastrointestinal Panel by PCR , Stool     Status: None   Collection Time: 12/04/17  7:16 PM  Result Value Ref Range Status   Campylobacter species NOT DETECTED NOT DETECTED Final   Plesimonas shigelloides NOT DETECTED NOT DETECTED Final   Salmonella species  NOT DETECTED NOT DETECTED Final   Yersinia enterocolitica NOT DETECTED NOT DETECTED Final   Vibrio species NOT DETECTED NOT DETECTED Final   Vibrio cholerae NOT DETECTED NOT DETECTED Final   Enteroaggregative E coli (EAEC) NOT DETECTED NOT DETECTED Final   Enteropathogenic E coli (EPEC) NOT DETECTED NOT DETECTED Final   Enterotoxigenic E coli (ETEC) NOT DETECTED NOT DETECTED Final   Shiga like toxin producing E coli (STEC) NOT DETECTED NOT DETECTED Final   Shigella/Enteroinvasive E coli (EIEC) NOT DETECTED NOT DETECTED Final   Cryptosporidium NOT DETECTED NOT DETECTED Final   Cyclospora cayetanensis NOT DETECTED NOT DETECTED Final   Entamoeba histolytica NOT DETECTED NOT DETECTED Final   Giardia lamblia NOT DETECTED NOT DETECTED Final   Adenovirus F40/41 NOT DETECTED NOT DETECTED Final   Astrovirus NOT DETECTED NOT DETECTED Final   Norovirus GI/GII NOT DETECTED NOT DETECTED Final   Rotavirus A NOT DETECTED NOT DETECTED Final   Sapovirus (I, II, IV, and V) NOT DETECTED NOT DETECTED Final    Comment: Performed at [facility], [address] [place], [place] [address]  MRSA PCR Screening     Status: None   Collection Time: 12/04/17 11:29 PM  Result Value Ref Range Status   MRSA by PCR NEGATIVE NEGATIVE Final    Comment:        The GeneXpert MRSA Assay (FDA approved for NASAL specimens only), is one component of a comprehensive MRSA colonization surveillance program. It is not intended to diagnose MRSA infection nor to guide or monitor treatment for MRSA infections. Performed at [facility], [place] [address] [place], [place] [address]   Culture, Urine     Status: Abnormal    Collection Time: 12/05/17  9:43 AM  Result Value Ref Range Status   Specimen Description   Final    URINE, CLEAN CATCH Performed at [facility], [place] [address], [place], [place] [address]    Special Requests   Final    NONE Performed at [facility], [place] [address] [place], [place] [address]    Culture MULTIPLE SPECIES PRESENT, SUGGEST RECOLLECTION (A)  Final   Report Status 12/06/2017 FINAL  Final    Radiology Reports Dg Chest 2 View  Result Date: 12/04/2017 CLINICAL DATA:  Shortness of breath. EXAM: CHEST  2 VIEW COMPARISON:  08/04/2008. FINDINGS: Mediastinum hilar structures are normal. Heart size normal. No focal infiltrate. No pleural effusion or pneumothorax. Thoracic spine scoliosis. IMPRESSION: No acute cardiopulmonary disease. Electronically Signed   By: Marcello Moores  Register   On: 12/04/2017 16:05   Dg Chest Port 1 View  Result Date: 12/04/2017 CLINICAL DATA:  42 year old male with weight loss, diarrhea, and sepsis. EXAM: PORTABLE CHEST 1 VIEW COMPARISON:  Chest radiograph dated 12/04/2017 FINDINGS: The lungs are clear. There is no pleural effusion pneumothorax. The cardiac silhouette is within normal limits. No acute osseous pathology. IMPRESSION: No active disease. Electronically Signed   By: Anner Crete M.D.   On: 12/04/2017 21:15   Dg Foot Complete Left  Result Date: 12/04/2017 CLINICAL DATA:  Foot pain.  Information. EXAM: LEFT FOOT - COMPLETE 3+ VIEW COMPARISON:  None. FINDINGS: No acute bony abnormality identified. No evidence of fracture dislocation. Mild degenerative changes first MTP joint. No evidence of inflammatory arthropathy. No radiopaque foreign body. IMPRESSION: Mild degenerative changes first MTP joint. No inflammatory arthropathy. No acute bony abnormality. Electronically Signed   By: Marcello Moores  Register   On: 12/04/2017 16:08   Dg Foot Complete  Right  Result Date: 12/04/2017 CLINICAL DATA:  Foot pain with inflammation  EXAM: RIGHT FOOT COMPLETE - 3+ VIEW COMPARISON:  Left foot radiographs 12/04/2017 FINDINGS: No fracture or malalignment. No definite periostitis or bone destruction. No radiopaque foreign body or soft tissue gas. IMPRESSION: No definite acute osseous abnormality Electronically Signed   By: Donavan Foil M.D.   On: 12/04/2017 16:07     CBC Recent Labs  Lab 12/10/17 0443 12/11/17 0418 12/12/17 0444 12/13/17 0457 12/14/17 0529  WBC 22.3* 20.6* 19.2* 13.0* 12.6*  HGB 7.8* 7.8* 8.4* 8.4* 9.2*  HCT 25.3* 25.6* 28.3* 27.6* 30.6*  PLT 453* 463* 513* 472* 518*  MCV 92.7 93.1 95.0 93.6 94.4  MCH 28.6 28.4 28.2 28.5 28.4  MCHC 30.8 30.5 29.7* 30.4 30.1  RDW 15.0 15.7* 16.1* 16.4* 17.0*    Chemistries  Recent Labs  Lab 12/08/17 0445 12/10/17 0443 12/11/17 0418 12/12/17 0444 12/13/17 0457 12/13/17 1217  NA 140  --  141  --  141 140  K 3.8  --  3.8  --  3.9 3.7  CL 112*  --  107  --  109 107  CO2 23  --  27  --  27 27  GLUCOSE 85  --  112*  --  85 82  BUN 11  --  10  --  11 10  CREATININE 0.89 0.86 0.85 0.85 0.85 0.76  CALCIUM 7.6*  --  7.8*  --  8.1* 7.7*  AST 78*  --  41  --   --  25  ALT 69*  --  58  --   --  46  ALKPHOS 61  --  63  --   --  65  BILITOT 0.7  --  0.5  --   --  0.2*   ------------------------------------------------------------------------------------------------------------------ No results for input(s): CHOL, HDL, LDLCALC, TRIG, CHOLHDL, LDLDIRECT in the last 72 hours.  Lab Results  Component Value Date   HGBA1C 4.9 02/12/2016   ------------------------------------------------------------------------------------------------------------------ No results for input(s): TSH, T4TOTAL, T3FREE, THYROIDAB in the last 72 hours.  Invalid input(s): FREET3 ------------------------------------------------------------------------------------------------------------------ No results for input(s): VITAMINB12, FOLATE, FERRITIN, TIBC, IRON, RETICCTPCT in the last 72  hours.  Coagulation profile No results for input(s): INR, PROTIME in the last 168 hours.  No results for input(s): DDIMER in the last 72 hours.  Cardiac Enzymes No results for input(s): CKMB, TROPONINI, MYOGLOBIN in the last 168 hours.  Invalid input(s): CK ------------------------------------------------------------------------------------------------------------------ No results found for: BNP  Spoke with  his mother at [phone]---     Roxan Hockey M.D on 12/14/2017 at 5:47 PM  Between 7am to 7pm - Pager - [phone]  After 7pm go to [email or web address] - password [email or web address]  [facility] -  Office  [phone]   Voice Recognition Viviann Spare dictation system was used to create this note, attempts have been made to correct errors. Please contact the author with questions and/or clarifications.

## 2017-12-15 MED ORDER — CODEINE SULFATE 30 MG PO TABS
15.0000 mg | ORAL_TABLET | Freq: Two times a day (BID) | ORAL | Status: AC
  Administered 2017-12-15 – 2017-12-16 (×4): 15 mg via ORAL
  Filled 2017-12-15 (×4): qty 1

## 2017-12-15 MED ORDER — CODEINE SULFATE 15 MG PO TABS: 15.0000 mg | ORAL_TABLET | Freq: Two times a day (BID) | ORAL | Status: DC

## 2017-12-15 MED ORDER — TRAZODONE HCL 50 MG PO TABS
50.0000 mg | ORAL_TABLET | Freq: Every evening | ORAL | Status: DC | PRN
  Administered 2017-12-15 – 2017-12-17 (×2): 50 mg via ORAL
  Filled 2017-12-15 (×2): qty 1

## 2017-12-15 NOTE — Progress Notes (Signed)
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 He has also reported feet were cold and turning dark in color. His caseworker came to see him on the day of the admission and called EMS for pt to be admitted to the hospital.    Assessment/Plan:  Sepsis Present on Admission-sepsis criteria met on admission,pt hasNecrotic toes,  blood cultures neg, urine culture with multiple species present, C. Diff neg, GI stool panel negative. Afebrile,  Treated with iv Zosynfor possible bowel/intra-abdominal infection, WBC is down to 12k from a peak of 32 k, please note that the patient is on steroids,    BL Lower Extremity, Frostbite the his Toes-Necrotic right second toe,venous Dopplernegative forDVTs. No evidence of osteomyelitis on x ray. Was Treated with IV Zosyn, orthopedic consult appreciated ,recommendedallowing demarcation of necrotic areas prior to further surgical recommendations,with subsequent outpatient follow-up with orthopedics to determine level of amputation  AKIsuperimposed on CKD 3--  resolved rhabdomyolysis , CKs have normalized, electrolytes and renal function is normalized,  continue IV fluids due to soft BP,  metabolic acidosis induced by chronic diarrhea has resolved, patient is on bicarb tablets which  helped  Ulcerative colitis withchronicDiarrhea-Diarrheais not  improving,  Lomotil was changed to 4 times daily, use Imodium and cholestyramine as prescribedulcerative  colitis/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 Unable to remove rectal tube. Will try codeine due to profuse non resolving diarrhea   Elevated LFT-Drug screen negative, alcohol < 10 on presentation. LFT elevation likely from sepsis and rhabdomyolysis. hep panel and HIV negative. LFT's normalized  Schizophrenia/SchizoaffectiveDisorder-Stable. continue Benztropine,Thorazine and patient getsInvega shots, next dose of Invega is 12/16/17       ChronicAnemia.-Due to poor nutritional intake, and underlying chronic colitis, no evidence of ongoing GI bleed, Hgb is stable above 8  Skin wounds- POA-Moisture associated skin damage to left hip and buttocks from excessive diarrhea and exposure to urine and stool during acute illness, present on admission.  - Left hip: 22 cm x 3 cm x 0.2 cm. Buttocks: Both gluteal areas affected: Each measures: 8 cm x 6 cm x 0.2 cm. WOC recommendations: Cleanse buttocks and left hip wounds with NS and pat dry. Apply Gerhardts butt paste twice daily and PRN soilage. No disposable briefs or underpads while in bed.   Severe PCM, underweight, hypoalbuminemia- BMI 16 ,nutritional supplements vitamins as recommended by dietitian, albumin is 1.5  TobaccoAbuse---continue Nicotine patch  Disposition-patient and mom  repeatedly declined transfer to skilled nursing facility for rehab, they [facility], Pt was having very profuse/large volume watery diarrhea, high risk for dehydration, electrolyte abnormalities and acute kidney injury, with changes to Lomotil and Imodium,  diarrhea is slowly and slightly improving, may  discharge home when volume and frequency of diarrhea improves enough to minimize his risk for significant dehydration, electrolyte abnormalities and acute kidney injury. D/w  his mother at [phone]     Code Status: full Family Communication: d/w patient,. RN (indicate person spoken with, relationship, and if by phone, the number) Disposition Plan:  Pend clinical improvement. ? [place]   Consultants:  [facility]  Ortho      Procedures:  none  Antibiotics: Anti-infectives (From admission, onward)   Start     Dose/Rate Route Frequency Ordered Stop   12/07/17 1500  vancomycin (VANCOCIN) 1,250 mg in sodium chloride 0.9 % 250 mL IVPB     1,250 mg 166.7 mL/hr over 90 Minutes Intravenous Every 24 hours 12/07/17 1403 12/12/17 2011   12/05/17 1800  vancomycin (VANCOCIN) IVPB 750 mg/150 ml premix  Status:  Discontinued     750 mg 150 mL/hr over 60 Minutes Intravenous Every 24 hours 12/04/17 2117 12/05/17 0750   12/05/17 0100  piperacillin-tazobactam (ZOSYN) IVPB 3.375 g  Status:  Discontinued     3.375 g 12.5 mL/hr over 240 Minutes Intravenous Every 8 hours 12/04/17 2117 12/14/17 0559   12/04/17 1830  vancomycin (VANCOCIN) IVPB 1000 mg/200 mL premix     1,000 mg 200 mL/hr over 60 Minutes Intravenous  Once 12/04/17 1823 12/04/17 2002   12/04/17 1830  piperacillin-tazobactam (ZOSYN) IVPB 3.375 g     3.375 g 100 mL/hr over 30 Minutes Intravenous  Once 12/04/17 1823 12/04/17 1902        (indicate start date, and stop date if known)  HPI/Subjective: Alert. Still having profuse diarrhea. No acute abdominal pains   Objective: Vitals:   12/14/17 2110 12/15/17 0428  BP: 124/77 119/82  Pulse: (!) 103 92  Resp: 18 18  Temp: 97.9 F (36.6 C) 97.9 F (36.6 C)  SpO2: 97% 99%    Intake/Output Summary (Last 24 hours) at 12/15/2017 1123 Last data filed at 12/15/2017 0900 Gross per 24 hour  Intake 720 ml  Output 2000 ml  Net -1280 ml   Filed Weights   12/04/17 2107  Weight: 54.4  kg (120 lb)    Exam:   General:  No distress   Cardiovascular: s1.s2 rrr  Respiratory: CTA BL  Abdomen: soft, nt, nd   Musculoskeletal: dry gangrene in toes    Data Reviewed: Basic Metabolic Panel: Recent Labs  Lab 12/10/17 0443 12/11/17 0418 12/12/17 0444 12/13/17 0457 12/13/17 1217  NA  --  141  --  141 140  K  --  3.8  --  3.9 3.7  CL  --  107  --  109 107  CO2  --  27  --  27 27  GLUCOSE  --  112*  --  85 82  BUN  --  10  --  11 10  CREATININE 0.86 0.85 0.85 0.85 0.76  CALCIUM  --  7.8*  --  8.1* 7.7*   Liver Function Tests: Recent Labs  Lab 12/11/17 0418 12/13/17 1217  AST 41 25  ALT 58 46  ALKPHOS 63 65  BILITOT 0.5 0.2*  PROT 6.7 6.9  ALBUMIN 1.3* 1.5*   No results for input(s): LIPASE, AMYLASE in the last 168 hours. No results for input(s): AMMONIA in the last 168 hours. CBC: Recent Labs  Lab 12/10/17 0443 12/11/17 0418 12/12/17 0444 12/13/17 0457 12/14/17 0529  WBC 22.3* 20.6* 19.2* 13.0* 12.6*  HGB 7.8* 7.8* 8.4* 8.4* 9.2*  HCT 25.3* 25.6* 28.3* 27.6* 30.6*  MCV 92.7 93.1 95.0 93.6 94.4  PLT 453* 463* 513* 472* 518*   Cardiac Enzymes: Recent Labs  Lab 12/09/17 0643 12/10/17  1994 12/11/17 0418 12/12/17 0444 12/13/17 0457  CKTOTAL 1,563* 874* 534* 305 193   BNP (last 3 results) No results for input(s): BNP in the last 8760 hours.  ProBNP (last 3 results) No results for input(s): PROBNP in the last 8760 hours.  CBG: No results for input(s): GLUCAP in the last 168 hours.  No results found for this or any previous visit (from the past 240 hour(s)).   Studies: No results found.  Scheduled Meds: . benztropine  0.5 mg Oral QHS  . budesonide  9 mg Oral Daily  . cholestyramine light  4 g Oral 3 times per day  . diphenoxylate-atropine  1 tablet Oral QID  . enoxaparin (LOVENOX) injection  40 mg Subcutaneous Q24H  . feeding supplement (ENSURE ENLIVE)  237 mL Oral Q24H  . folic acid  1 mg Oral Daily  . Gerhardt's butt cream    Topical BID  . mesalamine  4 g Rectal QHS  . multivitamin  1 tablet Oral Daily  . nicotine  21 mg Transdermal Q24H  . 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 If 7PM-7AM, please contact night-coverage at [email or web address], password [facility] 12/15/2017, 11:23 AM  LOS: 11 days

## 2017-12-15 NOTE — Progress Notes (Signed)
Physical Therapy Treatment Patient Details Name: Christian Lamb MRN: [id number] DOB: [birth year] Today's Date: 12/15/2017    History of Present Illness Staley Lunz is a 42 y.o. 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  Patient  found to also have excoriated buttock s from diarrhea, also with darkened toes, for ABI's to eavaluate. negative DVT.    PT Comments    Assisted OOB to amb a limited distance with walker.  Very unsteady gait and increased HR to 142.    Follow Up Recommendations  SNF;Supervision/Assistance - 24 hour     Equipment Recommendations       Recommendations for Other Services       Precautions / Restrictions Precautions Precautions: Fall Precaution Comments: rectal tube and B LE s/p frost bite Restrictions Weight Bearing Restrictions: No    Mobility  Bed Mobility Overal bed mobility: Needs Assistance Bed Mobility: Supine to Sit     Supine to sit: Supervision     General bed mobility comments: used rail to lift trunk, able to move feet off bed with supervision  Transfers Overall transfer level: Needs assistance Equipment used: Rolling walker (2 wheeled) Transfers: Sit to/from [facility] Sit to Stand: Min guard Stand pivot transfers: Min guard       General transfer comment: 50% VC's on proper hand placement and turn completion.  VC's for safety  Ambulation/Gait Ambulation/Gait assistance: Min guard Ambulation Distance (Feet): 55 Feet Assistive device: Rolling walker (2 wheeled) Gait Pattern/deviations: Step-through pattern;Staggering left;Staggering right Gait velocity: decreased   General Gait Details: very unsteady gait with lean on walker.     Stairs             Wheelchair Mobility    Modified Rankin (Stroke Patients Only)       Balance                                            Cognition     Overall Cognitive Status: Within Functional Limits for tasks assessed                                 General Comments: required MAX encouragement      Exercises      General Comments        Pertinent Vitals/Pain Pain Assessment: No/denies pain    Home Living                      Prior Function            PT Goals (current goals can now be found in the care plan section) Progress towards PT goals: Progressing toward goals    Frequency    Min 2X/week      PT Plan Current plan remains appropriate    Co-evaluation              AM-PAC PT "6 Clicks" Daily Activity  Outcome Measure  Difficulty turning over in bed (including adjusting bedclothes, sheets and blankets)?: A Little Difficulty moving from lying on back to sitting on the side of the bed? : A Little Difficulty sitting down on and standing up from a  chair with arms (e.g., wheelchair, bedside commode, etc,.)?: A Little Help needed moving to and from a bed to chair (including a wheelchair)?: A Little Help needed walking in hospital room?: A Little Help needed climbing 3-5 steps with a railing? : A Lot 6 Click Score: 17    End of Session Equipment Utilized During Treatment: Gait belt Activity Tolerance: Patient limited by fatigue Patient left: with chair alarm set;in chair;with call bell/phone within reach Nurse Communication: Mobility status PT Visit Diagnosis: Unsteadiness on feet (R26.81);Muscle weakness (generalized) (M62.81)     Time: 8115-7262 PT Time Calculation (min) (ACUTE ONLY): 24 min  Charges:  $Gait Training: 8-22 mins $Therapeutic Activity: 8-22 mins                    G Codes:       Rica Koyanagi  PTA [facility]  Acute  Rehab Pager      [phone]

## 2017-12-16 NOTE — Progress Notes (Signed)
Pt Dropped Lomotil pill on floor. Wasted in sharps container. Pulled additional dose from pyxis

## 2017-12-16 NOTE — Progress Notes (Signed)
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 He has also reported feet were cold and turning dark in color. His caseworker came to see him on the day of the admission and called EMS for pt to be admitted to the hospital.    Assessment/Plan:  Sepsis Present on Admission-sepsis criteria met on admission,pt hasNecrotic toes,  blood cultures neg, urine culture with multiple species present, C. Diff neg, GI stool panel negative. Afebrile,  Treated with iv Zosynfor possible bowel/intra-abdominal infection, WBC is down to 12k from a peak of 32 k, please note that the patient is on steroids,    BL Lower Extremity, Frostbite the his Toes-Necrotic right second toe,venous Dopplernegative forDVTs. No evidence of osteomyelitis on x ray. Was Treated with IV Zosyn, orthopedic consult appreciated ,recommendedallowing demarcation of necrotic areas prior to further surgical recommendations,with subsequent outpatient follow-up with orthopedics to determine level of amputation  AKIsuperimposed on CKD 3--  resolved rhabdomyolysis , CKs have normalized, electrolytes and renal function is normalized,  continue IV fluids due to soft BP,  metabolic acidosis induced by chronic diarrhea has resolved, patient is on bicarb tablets which  helped  Ulcerative colitis withchronicDiarrhea-Diarrheais not  improving,  Lomotil was changed to 4 times daily, use Imodium and cholestyramine as prescribedulcerative  colitis/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 Unable to remove rectal tube. Started codeine due to profuse non resolving diarrhea. Today, stool is formed. Will d/c rectal tube, monitor 24-48 hrs    Elevated LFT-Drug screen negative, alcohol < 10 on presentation. LFT elevation likely from sepsis and rhabdomyolysis. hep panel and HIV negative. LFT's normalized  Schizophrenia/SchizoaffectiveDisorder-Stable. continue Benztropine,Thorazine and patient getsInvega shots, next dose of Invega is 12/16/17       ChronicAnemia.-Due to poor nutritional intake, and underlying chronic colitis, no evidence of ongoing GI bleed, Hgb is stable above 8  Skin wounds- POA-Moisture associated skin damage to left hip and buttocks from excessive diarrhea and exposure to urine and stool during acute illness, present on admission.  - Left hip: 22 cm x 3 cm x 0.2 cm. Buttocks: Both gluteal areas affected: Each measures: 8 cm x 6 cm x 0.2 cm. WOC recommendations: Cleanse buttocks and left hip wounds with NS and pat dry. Apply Gerhardts butt paste twice daily and PRN soilage. No disposable briefs or underpads while in bed.   Severe PCM, underweight, hypoalbuminemia- BMI 16 ,nutritional supplements vitamins as recommended by dietitian, albumin is 1.5  TobaccoAbuse---continue Nicotine patch  Disposition-patient and mom  repeatedly declined transfer to skilled nursing facility for rehab, they [facility], Pt was having very profuse/large volume watery diarrhea, high risk for dehydration, electrolyte abnormalities and acute kidney injury, with changes to Lomotil  and Imodium,  diarrhea is slowly and slightly improving, may discharge home when volume and frequency of diarrhea improves enough to minimize his risk  for significant dehydration, electrolyte abnormalities and acute kidney injury. D/w  his mother at [phone]     Code Status: full Family Communication: d/w patient,. RN (indicate person spoken with, relationship, and if by phone, the number) Disposition Plan:  Pend clinical improvement. ? [facility].    Consultants:  [facility]  Ortho      Procedures:  none  Antibiotics: Anti-infectives (From admission, onward)   Start     Dose/Rate Route Frequency Ordered Stop   12/07/17 1500  vancomycin (VANCOCIN) 1,250 mg in sodium chloride 0.9 % 250 mL IVPB     1,250 mg 166.7 mL/hr over 90 Minutes Intravenous Every 24 hours 12/07/17 1403 12/12/17 2011   12/05/17 1800  vancomycin (VANCOCIN) IVPB 750 mg/150 ml premix  Status:  Discontinued     750 mg 150 mL/hr over 60 Minutes Intravenous Every 24 hours 12/04/17 2117 12/05/17 0750   12/05/17 0100  piperacillin-tazobactam (ZOSYN) IVPB 3.375 g  Status:  Discontinued     3.375 g 12.5 mL/hr over 240 Minutes Intravenous Every 8 hours 12/04/17 2117 12/14/17 0559   12/04/17 1830  vancomycin (VANCOCIN) IVPB 1000 mg/200 mL premix     1,000 mg 200 mL/hr over 60 Minutes Intravenous  Once 12/04/17 1823 12/04/17 2002   12/04/17 1830  piperacillin-tazobactam (ZOSYN) IVPB 3.375 g     3.375 g 100 mL/hr over 30 Minutes Intravenous  Once 12/04/17 1823 12/04/17 1902       (indicate start date, and stop date if known)  HPI/Subjective: Alert. Still having profuse diarrhea. No acute abdominal pains   Objective: Vitals:   12/15/17 1341 12/16/17 0537  BP: 106/64 117/77  Pulse: (!) 114 (!) 106  Resp: 18 18  Temp: 98.2 F (36.8 C) 98.5 F (36.9 C)  SpO2: 100% 100%    Intake/Output Summary (Last 24 hours) at 12/16/2017 0947 Last data filed at 12/16/2017 0925 Gross per 24 hour  Intake 300 ml  Output 3550  ml  Net -3250 ml   Filed Weights   12/04/17 2107  Weight: 54.4 kg (120 lb)    Exam:   General:  No distress   Cardiovascular: s1.s2 rrr  Respiratory: CTA BL  Abdomen: soft, nt, nd   Musculoskeletal: dry gangrene in toes    Data Reviewed: Basic Metabolic Panel: Recent Labs  Lab 12/10/17 0443 12/11/17 0418 12/12/17 0444 12/13/17 0457 12/13/17 1217  NA  --  141  --  141 140  K  --  3.8  --  3.9 3.7  CL  --  107  --  109 107  CO2  --  27  --  27 27  GLUCOSE  --  112*  --  85 82  BUN  --  10  --  11 10  CREATININE 0.86 0.85 0.85 0.85 0.76  CALCIUM  --  7.8*  --  8.1* 7.7*   Liver Function Tests: Recent Labs  Lab 12/11/17 0418 12/13/17 1217  AST 41 25  ALT 58 46  ALKPHOS 63 65  BILITOT 0.5 0.2*  PROT 6.7 6.9  ALBUMIN 1.3* 1.5*   No results for input(s): LIPASE, AMYLASE in the last 168 hours. No results for input(s): AMMONIA in the last 168 hours. CBC: Recent Labs  Lab 12/10/17 0443 12/11/17 0418 12/12/17 0444 12/13/17 0457 12/14/17 0529  WBC 22.3* 20.6* 19.2* 13.0* 12.6*  HGB 7.8* 7.8* 8.4* 8.4* 9.2*  HCT 25.3* 25.6* 28.3* 27.6* 30.6*  MCV 92.7 93.1 95.0 93.6 94.4  PLT 453* 463* 513* 472*  518*   Cardiac Enzymes: Recent Labs  Lab 12/10/17 0443 12/11/17 0418 12/12/17 0444 12/13/17 0457  CKTOTAL 874* 534* 305 193   BNP (last 3 results) No results for input(s): BNP in the last 8760 hours.  ProBNP (last 3 results) No results for input(s): PROBNP in the last 8760 hours.  CBG: No results for input(s): GLUCAP in the last 168 hours.  No results found for this or any previous visit (from the past 240 hour(s)).   Studies: No results found.  Scheduled Meds: . benztropine  0.5 mg Oral QHS  . budesonide  9 mg Oral Daily  . cholestyramine light  4 g Oral 3 times per day  . codeine  15 mg Oral BID  . diphenoxylate-atropine  1 tablet Oral QID  . enoxaparin (LOVENOX) injection  40 mg Subcutaneous Q24H  . feeding supplement (ENSURE ENLIVE)  237  mL Oral Q24H  . folic acid  1 mg Oral Daily  . Gerhardt's butt cream   Topical BID  . mesalamine  4 g Rectal QHS  . multivitamin  1 tablet Oral Daily  . nicotine  21 mg Transdermal Q24H  . 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 If 7PM-7AM, please contact night-coverage at [email or web address], password [facility] 12/16/2017, 9:47 AM  LOS: 12 days

## 2017-12-16 NOTE — Progress Notes (Signed)
Received pt in good condition. Agree with previous RN assessment

## 2017-12-17 DIAGNOSIS — D638 Anemia in other chronic diseases classified elsewhere: Secondary | ICD-10-CM

## 2017-12-17 MED ORDER — ENSURE ENLIVE PO LIQD
237.0000 mL | Freq: Two times a day (BID) | ORAL | Status: DC
  Administered 2017-12-17 – 2017-12-18 (×3): 237 mL via ORAL

## 2017-12-17 NOTE — Progress Notes (Addendum)
Nutrition Follow-up  DOCUMENTATION CODES:   Underweight, Severe malnutrition in context of chronic illness  INTERVENTION:    Ensure Enlive BID, each supplement provides 350 kcal and 20 grams of protein  Discontinue Hormel shakepo BID, each supplement provides 520kcal and 22g of protein.  NUTRITION DIAGNOSIS:   Severe Malnutrition related to chronic illness(Chronic ulcerative colitis s/p total colectomy) as evidenced by severe fat depletion, severe muscle depletion.  Ongoing  GOAL:   Patient will meet greater than or equal to 90% of their needs  Meeting  MONITOR:   PO intake, Supplement acceptance, Skin, I & O's, Labs  REASON FOR ASSESSMENT:   Malnutrition Screening Tool    ASSESSMENT:   Pt with PMH of schizophrenia, anemia, chronic diarrhea and ulcerative colitis/chronic ulcerative enterocolitis s/p total colectomy (2003) and small intestine surgery (2016). Pt with ileal pouch-anal anastomosis, who presents with ongoing diarrhea and SOB, found sepsis secondary to UTI   Rectal tube removed 3/12. Stool is formed, diarrhea has resolved at this time. Pt reports appetite is great consuming 100% of all meals. Pt had eggs with pancakes this morning. He is drinking one Ensure per day. Reports he has not received mighty shakes. Amendable to drinking two ensures instead of mighty shakes. RD to change order. A recent weight has not been obtained since last RD visit.   Medications reviewed and include: folic acid, MVI, 975 mg sodium bicarbonate Labs reviewed.   Diet Order:  Diet Heart Room service appropriate? Yes; Fluid consistency: Thin  EDUCATION NEEDS:   Not appropriate for education at this time  Skin:  Skin Assessment: Skin Integrity Issues: Skin Integrity Issues:: Other (Comment) Other: buttocks/hip/frost bite right second toe  Last BM:  12/16/17  Height:   Ht Readings from Last 1 Encounters:  12/04/17 6' (1.829 m)    Weight:   Wt Readings from Last 1  Encounters:  12/04/17 120 lb (54.4 kg)    Ideal Body Weight:  80.9 kg  BMI:  Body mass index is 16.27 kg/m.  Estimated Nutritional Needs:   Kcal:  1700-1900  Protein:  85-95 grams  Fluid:  >/= 1.7 L/d    Mariana Single RD, LDN Clinical Nutrition Pager # - [phone]

## 2017-12-17 NOTE — Progress Notes (Addendum)
Patient Demographics:    Christian Lamb, is a 42 y.o. male, DOB - January 14, 1976, KCL:[id number]  Admit date - 12/04/2017   Admitting Physician Norval Morton, MD  Outpatient Primary MD for the patient is Christian, Jinny [facility]  LOS - 29   Chief Complaint  Patient presents with  . Diarrhea  . 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 He has also reported feet were cold and turning dark in color. His caseworker came to see him on the day of the admission and called EMS for pt to be admitted to the hospital.  Assessment/Plan:  1)Sepsis Present on Admission-sepsis criteria met on admission,pt hasNecrotic toes,  blood cultures neg, urine culture with multiple species present, C. Diff neg, GI stool panel negative. Afebrile, Treated with iv Zosynfor possible bowel/intra-abdominal infection, WBC is down to 12k from a peak of 32 k, please note that the patient is on steroids,    2)BL Lower Extremity, Frostbite the his  Toes-Necrotic right second toe,venous Dopplernegative forDVTs. No evidence of osteomyelitis on x ray. Was Treated with IV Zosyn, orthopedic consult appreciated ,recommendedallowing demarcation of necrotic areas prior to further surgical recommendations,with subsequent outpatient follow-up with orthopedics to determine level of amputation  3)AKIsuperimposed on CKD 3--  resolved rhabdomyolysis , CKs have normalized, electrolytes and renal function is normalized,  continue IV fluids due to soft BP,  metabolic acidosis induced by chronic diarrhea has resolved, patient is on bicarb tablets which  helped  4)Ulcerative colitis withchronicDiarrhea-Diarrheais slowly improving,  Lomotil was changed to 4 times daily, use Imodium and cholestyramine as 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 LFT elevation likely from sepsis and rhabdomyolysis. hep panel and HIV negative. LFT's normalized  6)Schizophrenia/SchizoaffectiveDisorder-Stable. continue Benztropine,Thorazine and patient getsInvega shots, next dose of Invega was 12/16/17, d/w Pharmacist...they will look into  giving Invega or formulary substitute on 12/17/17        7)ChronicAnemia.-Due to poor nutritional intake, and underlying chronic colitis, no evidence of ongoing GI bleed, Hgb is stable above 8  8)Skin wounds- POA-Moisture associated skin damage to left hip and  buttocks from excessive diarrhea and exposure to urine and stool during acute illness, present on admission.  - Left hip: 22 cm x 3 cm x 0.2 cm. Buttocks: Both gluteal areas  affected: Each measures: 8 cm x 6 cm x 0.2 cm. WOC recommendations: Cleanse buttocks and left hip wounds with NS and pat dry. Apply Gerhardts butt paste twice daily and PRN soilage. No disposable briefs or underpads while in bed.   9)Severe PCM, underweight, hypoalbuminemia- BMI 16 ,nutritional supplements vitamins as recommended by dietitian, albumin is 1.5  10)TobaccoAbuse---continue Nicotine patch  11)Disposition-patient and mom  repeatedly declined transfer to skilled nursing facility for rehab, they want discharge home with family with [facility], on Lomotil and Imodium,  diarrhea has improved.  Unable to reach  his mother at [phone] on 12/17/17 despite multiple attempts   Code Status:full Family Communication:d/w patient, previously spoke with his mother at [phone]---  Disposition Plan: Home with Home health,  Not medically ready yet,previously  spoke with his mother at [phone]---   Consultants:  [facility]  Ortho  Procedures:  Rectal tube insertion  Antibiotics:  Zosyn02/28 -->  Vancomycin 03/03 -->  DVT Prophylaxis  :  Lovenox       Lab Results  Component Value Date   PLT 518 (H) 12/14/2017    Inpatient Medications  Scheduled Meds: . benztropine  0.5 mg Oral QHS  . budesonide  9 mg Oral Daily  . cholestyramine light  4 g Oral 3 times per day  . diphenoxylate-atropine  1 tablet Oral QID  . enoxaparin (LOVENOX) injection  40 mg Subcutaneous Q24H  . feeding supplement (ENSURE ENLIVE)  237 mL Oral BID BM  . folic acid  1 mg Oral Daily  . Gerhardt's butt cream   Topical BID  . mesalamine  4 g Rectal QHS  . multivitamin  1 tablet Oral Daily  . nicotine  21 mg Transdermal Q24H  . sodium bicarbonate  650 mg Oral Daily   Continuous Infusions: PRN Meds:.acetaminophen **OR** acetaminophen, chlorproMAZINE, ipratropium-albuterol, loperamide, LORazepam, ondansetron **OR** ondansetron (ZOFRAN) IV, oxyCODONE, sodium  chloride, traZODone    Anti-infectives (From admission, onward)   Start     Dose/Rate Route Frequency Ordered Stop   12/07/17 1500  vancomycin (VANCOCIN) 1,250 mg in sodium chloride 0.9 % 250 mL IVPB     1,250 mg 166.7 mL/hr over 90 Minutes Intravenous Every 24 hours 12/07/17 1403 12/12/17 2011   12/05/17 1800  vancomycin (VANCOCIN) IVPB 750 mg/150 ml premix  Status:  Discontinued     750 mg 150 mL/hr over 60 Minutes Intravenous Every 24 hours 12/04/17 2117 12/05/17 0750   12/05/17 0100  piperacillin-tazobactam (ZOSYN) IVPB 3.375 g  Status:  Discontinued     3.375 g 12.5 mL/hr over 240 Minutes Intravenous Every 8 hours 12/04/17 2117 12/14/17 0559   12/04/17 1830  vancomycin (VANCOCIN) IVPB 1000 mg/200 mL premix     1,000 mg 200 mL/hr over 60 Minutes Intravenous  Once 12/04/17 1823 12/04/17 2002   12/04/17 1830  piperacillin-tazobactam (ZOSYN) IVPB 3.375 g     3.375 g 100 mL/hr over 30 Minutes Intravenous  Once 12/04/17 1823 12/04/17 1902        Objective:   Vitals:   12/16/17 0537 12/16/17 2146 12/17/17 0416 12/17/17 1303  BP: 117/77 104/78 109/74   Pulse: (!) 106 (!) 145 (!) 115 (!) 123  Resp: _0 Temp: 98.5 F (36.9 C) 98.1 F (36.7 C) (!) 97.5 F (  36.4 C) 97.8 F (36.6 C)  TempSrc: Oral Oral Oral Oral  SpO2: 100% 100% 100% 100%  Weight:      Height:        Wt Readings from Last 3 Encounters:  12/04/17 54.4 kg (120 lb)  10/03/17 61.7 kg (136 lb)  07/14/17 59.4 kg (131 lb)     Intake/Output Summary (Last 24 hours) at 12/17/2017 1637 Last data filed at 12/17/2017 1300 Gross per 24 hour  Intake 600 ml  Output 2150 ml  Net -1550 ml     Physical Exam  Gen:- Awake Alert,  cachetic HEENT:- [place].AT, No sclera icterus Neck-Supple Neck,No JVD,.  Lungs-  CTAB , no wheezing CV- S1, S2 normal, HR 108 Abd-  +ve B.Sounds, Abd Soft, No tenderness,    Extremity/Skin:-Necrotic changes and frostbite related changes of multiple toes bilaterally Psych-affect is  appropriate, oriented x3 Neuro-no new focal deficits, generalized weakness Rectal-rectal tube has been removed    Data Review:   Micro Results No results found for this or any previous visit (from the past 240 hour(s)).  Radiology Reports Dg Chest 2 View  Result Date: 12/04/2017 CLINICAL DATA:  Shortness of breath. EXAM: CHEST  2 VIEW COMPARISON:  08/04/2008. FINDINGS: Mediastinum hilar structures are normal. Heart size normal. No focal infiltrate. No pleural effusion or pneumothorax. Thoracic spine scoliosis. IMPRESSION: No acute cardiopulmonary disease. Electronically Signed   By: Marcello Moores  Register   On: 12/04/2017 16:05   Dg Chest Port 1 View  Result Date: 12/04/2017 CLINICAL DATA:  42 year old male with weight loss, diarrhea, and sepsis. EXAM: PORTABLE CHEST 1 VIEW COMPARISON:  Chest radiograph dated 12/04/2017 FINDINGS: The lungs are clear. There is no pleural effusion pneumothorax. The cardiac silhouette is within normal limits. No acute osseous pathology. IMPRESSION: No active disease. Electronically Signed   By: Anner Crete M.D.   On: 12/04/2017 21:15   Dg Foot Complete Left  Result Date: 12/04/2017 CLINICAL DATA:  Foot pain.  Information. EXAM: LEFT FOOT - COMPLETE 3+ VIEW COMPARISON:  None. FINDINGS: No acute bony abnormality identified. No evidence of fracture dislocation. Mild degenerative changes first MTP joint. No evidence of inflammatory arthropathy. No radiopaque foreign body. IMPRESSION: Mild degenerative changes first MTP joint. No inflammatory arthropathy. No acute bony abnormality. Electronically Signed   By: Marcello Moores  Register   On: 12/04/2017 16:08   Dg Foot Complete Right  Result Date: 12/04/2017 CLINICAL DATA:  Foot pain with inflammation EXAM: RIGHT FOOT COMPLETE - 3+ VIEW COMPARISON:  Left foot radiographs 12/04/2017 FINDINGS: No fracture or malalignment. No definite periostitis or bone destruction. No radiopaque foreign body or soft tissue gas. IMPRESSION: No  definite acute osseous abnormality Electronically Signed   By: Donavan Foil M.D.   On: 12/04/2017 16:07     CBC Recent Labs  Lab 12/11/17 0418 12/12/17 0444 12/13/17 0457 12/14/17 0529  WBC 20.6* 19.2* 13.0* 12.6*  HGB 7.8* 8.4* 8.4* 9.2*  HCT 25.6* 28.3* 27.6* 30.6*  PLT 463* 513* 472* 518*  MCV 93.1 95.0 93.6 94.4  MCH 28.4 28.2 28.5 28.4  MCHC 30.5 29.7* 30.4 30.1  RDW 15.7* 16.1* 16.4* 17.0*    Chemistries  Recent Labs  Lab 12/11/17 0418 12/12/17 0444 12/13/17 0457 12/13/17 1217  NA 141  --  141 140  K 3.8  --  3.9 3.7  CL 107  --  109 107  CO2 27  --  27 27  GLUCOSE 112*  --  85 82  BUN 10  --  11  10  CREATININE 0.85 0.85 0.85 0.76  CALCIUM 7.8*  --  8.1* 7.7*  AST 41  --   --  25  ALT 58  --   --  46  ALKPHOS 63  --   --  65  BILITOT 0.5  --   --  0.2*   ------------------------------------------------------------------------------------------------------------------ No results for input(s): CHOL, HDL, LDLCALC, TRIG, CHOLHDL, LDLDIRECT in the last 72 hours.  Lab Results  Component Value Date   HGBA1C 4.9 02/12/2016   ------------------------------------------------------------------------------------------------------------------ No results for input(s): TSH, T4TOTAL, T3FREE, THYROIDAB in the last 72 hours.  Invalid input(s): FREET3 ------------------------------------------------------------------------------------------------------------------ No results for input(s): VITAMINB12, FOLATE, FERRITIN, TIBC, IRON, RETICCTPCT in the last 72 hours.  Coagulation profile No results for input(s): INR, PROTIME in the last 168 hours.  No results for input(s): DDIMER in the last 72 hours.  Cardiac Enzymes No results for input(s): CKMB, TROPONINI, MYOGLOBIN in the last 168 hours.  Invalid input(s): CK ------------------------------------------------------------------------------------------------------------------ No results found for: BNP   Roxan Hockey M.D on 12/17/2017 at 4:37 PM  Between 7am to 7pm - Pager - [phone]  After 7pm go to [email or web address] - password [email or web address]  [facility] -  Office  [phone]   Voice Recognition Viviann Spare dictation system was used to create this note, attempts have been made to correct errors. Please contact the author with questions and/or clarifications.

## 2017-12-17 NOTE — Progress Notes (Signed)
Unable to reach pt's mother x2. Left voice mail.

## 2017-12-17 NOTE — Progress Notes (Signed)
Physical Therapy Treatment Patient Details Name: Christian Lamb MRN: [id number] DOB: [birth year] Today's Date: 12/17/2017    History of Present Illness Christian Lamb is a 42 y.o. 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  Patient  found to also have excoriated buttock s from diarrhea, also with darkened toes, for ABI's to eavaluate. negative DVT.    PT Comments    The patient demonstrates improved balance. HR continue to increase quickly with activity from 110 to 133 sitting to 157 while ambulating. RN aware. Continue PT.   Follow Up Recommendations  SNF;Supervision/Assistance - 24 hour     Equipment Recommendations  Other (comment)    Recommendations for Other Services       Precautions / Restrictions Precautions Precautions: Fall Precaution Comments: monitor HR    Mobility  Bed Mobility Overal bed mobility: Independent                Transfers Overall transfer level: Needs assistance Equipment used: Rolling walker (2 wheeled) Transfers: Sit to/from Stand Sit to Stand: Min guard         General transfer comment: 50% VC's on proper hand placement and turn completion.  VC's for safety  Ambulation/Gait Ambulation/Gait assistance: Min guard Ambulation Distance (Feet): 200 Feet Assistive device: Rolling walker (2 wheeled) Gait Pattern/deviations: Step-through pattern     General Gait Details: gait is much steadier today and no noted balance loss. HR 110- 157. RN aware and gave clearance to increase distance.   Stairs            Wheelchair Mobility    Modified Rankin (Stroke Patients Only)       Balance                                            Cognition Arousal/Alertness:  Awake/alert Behavior During Therapy: Flat affect;WFL for tasks assessed/performed Overall Cognitive Status: Within Functional Limits for tasks assessed                                        Exercises      General Comments        Pertinent Vitals/Pain Pain Assessment: No/denies pain    Home Living                      Prior Function            PT Goals (current goals can now be found in the care plan section) Progress towards PT goals: Progressing toward goals    Frequency           PT Plan Current plan remains appropriate    Co-evaluation              AM-PAC PT "6 Clicks" Daily Activity  Outcome Measure  Difficulty turning over in bed (including adjusting bedclothes, sheets and blankets)?: None Difficulty moving from lying on back to sitting on the side of the bed? : None Difficulty sitting down on and standing up from a chair with arms (e.g., wheelchair, bedside commode, etc,.)?: A Little Help needed moving to and from a bed to chair (including  a wheelchair)?: A Little Help needed walking in hospital room?: A Lot Help needed climbing 3-5 steps with a railing? : A Lot 6 Click Score: 18    End of Session Equipment Utilized During Treatment: Gait belt Activity Tolerance: Patient tolerated treatment well Patient left: in bed;with call bell/phone within reach;with bed alarm set Nurse Communication: Mobility status PT Visit Diagnosis: Unsteadiness on feet (R26.81);Muscle weakness (generalized) (M62.81)     Time: 5672-0919 PT Time Calculation (min) (ACUTE ONLY): 19 min  Charges:  $Gait Training: 8-22 mins                    G CodesTresa Endo PT [phone]    Claretha Cooper 12/17/2017, 4:42 PM

## 2017-12-18 LAB — COMPREHENSIVE METABOLIC PANEL
ALK PHOS: 67 U/L (ref 38–126)
ALT: 34 U/L (ref 17–63)
ANION GAP: 7 (ref 5–15)
AST: 17 U/L (ref 15–41)
Albumin: 2 g/dL — ABNORMAL LOW (ref 3.5–5.0)
BILIRUBIN TOTAL: 0.5 mg/dL (ref 0.3–1.2)
BUN: 15 mg/dL (ref 6–20)
CALCIUM: 8.7 mg/dL — AB (ref 8.9–10.3)
CO2: 29 mmol/L (ref 22–32)
Chloride: 104 mmol/L (ref 101–111)
Creatinine, Ser: 0.76 mg/dL (ref 0.61–1.24)
GFR calc Af Amer: >60 mL/min (ref >60)
Glucose, Bld: 97 mg/dL (ref 65–99)
POTASSIUM: 4 mmol/L (ref 3.5–5.1)
Sodium: 140 mmol/L (ref 135–145)
TOTAL PROTEIN: 7.9 g/dL (ref 6.5–8.1)

## 2017-12-18 LAB — CBC
HCT: 33 % — ABNORMAL LOW (ref 39.0–52.0)
HEMOGLOBIN: 9.9 g/dL — AB (ref 13.0–17.0)
MCH: 29.1 pg (ref 26.0–34.0)
MCHC: 30 g/dL (ref 30.0–36.0)
MCV: 97.1 fL (ref 78.0–100.0)
Platelets: 471 10*3/uL — ABNORMAL HIGH (ref 150–400)
RBC: 3.4 MIL/uL — ABNORMAL LOW (ref 4.22–5.81)
RDW: 17.9 % — AB (ref 11.5–15.5)
WBC: 7.1 10*3/uL (ref 4.0–10.5)

## 2017-12-18 MED ORDER — PALIPERIDONE ER 6 MG PO TB24: 12.0000 mg | ORAL_TABLET | Freq: Every day | ORAL | 0 refills | Status: DC

## 2017-12-18 MED ORDER — TRAZODONE HCL 50 MG PO TABS: 50.0000 mg | ORAL_TABLET | Freq: Every evening | ORAL | 1 refills | Status: DC | PRN

## 2017-12-18 MED ORDER — CHLORPROMAZINE HCL 25 MG PO TABS: 25.0000 mg | ORAL_TABLET | Freq: Three times a day (TID) | ORAL | 1 refills | Status: DC | PRN

## 2017-12-18 MED ORDER — ENSURE ENLIVE PO LIQD: 237.0000 mL | Freq: Two times a day (BID) | ORAL | 3 refills | Status: DC

## 2017-12-18 MED ORDER — DIPHENOXYLATE-ATROPINE 2.5-0.025 MG PO TABS: 1.0000 | ORAL_TABLET | Freq: Four times a day (QID) | ORAL | 2 refills | Status: DC

## 2017-12-18 MED ORDER — NICOTINE 21 MG/24HR TD PT24: 21.0000 mg | MEDICATED_PATCH | TRANSDERMAL | 1 refills | Status: DC

## 2017-12-18 MED ORDER — OXYCODONE HCL 5 MG PO TABS: 5.0000 mg | ORAL_TABLET | ORAL | 0 refills | Status: DC | PRN

## 2017-12-18 MED ORDER — B COMPLEX PO TABS: 1.0000 | ORAL_TABLET | Freq: Every day | ORAL | 1 refills | Status: DC

## 2017-12-18 MED ORDER — IPRATROPIUM-ALBUTEROL 0.5-2.5 (3) MG/3ML IN SOLN: 3.0000 mL | RESPIRATORY_TRACT | 1 refills | Status: DC | PRN

## 2017-12-18 MED ORDER — ATENOLOL 25 MG PO TABS
12.5000 mg | ORAL_TABLET | Freq: Every day | ORAL | Status: DC
  Administered 2017-12-18: 12.5 mg via ORAL
  Filled 2017-12-18: qty 1

## 2017-12-18 MED ORDER — LORAZEPAM 0.5 MG PO TABS: 0.5000 mg | ORAL_TABLET | ORAL | 0 refills | Status: DC | PRN

## 2017-12-18 MED ORDER — ATENOLOL 25 MG PO TABS: 12.5000 mg | ORAL_TABLET | Freq: Every day | ORAL | 1 refills | Status: DC

## 2017-12-18 MED ORDER — LOPERAMIDE HCL 2 MG PO CAPS: 2.0000 mg | ORAL_CAPSULE | Freq: Three times a day (TID) | ORAL | 2 refills | Status: DC

## 2017-12-18 MED ORDER — ONDANSETRON HCL 4 MG PO TABS: 4.0000 mg | ORAL_TABLET | Freq: Four times a day (QID) | ORAL | 2 refills | Status: DC | PRN

## 2017-12-18 MED ORDER — FOLIC ACID 1 MG PO TABS: 1.0000 mg | ORAL_TABLET | Freq: Every day | ORAL | 3 refills | Status: DC

## 2017-12-18 MED ORDER — GERHARDT'S BUTT CREAM: 1.0000 "application " | TOPICAL_CREAM | Freq: Two times a day (BID) | CUTANEOUS | 2 refills | Status: DC

## 2017-12-18 MED ORDER — BUDESONIDE 3 MG PO CPEP: 9.0000 mg | ORAL_CAPSULE | Freq: Every day | ORAL | 3 refills | Status: DC

## 2017-12-18 MED ORDER — PROSIGHT PO TABS: 1.0000 | ORAL_TABLET | Freq: Every day | ORAL | 2 refills | Status: DC

## 2017-12-18 MED ORDER — SODIUM BICARBONATE 650 MG PO TABS: 650.0000 mg | ORAL_TABLET | Freq: Every day | ORAL | 2 refills | Status: DC

## 2017-12-18 MED ORDER — BENZTROPINE MESYLATE 0.5 MG PO TABS: 0.5000 mg | ORAL_TABLET | Freq: Every evening | ORAL | 2 refills | Status: DC

## 2017-12-18 MED ORDER — MESALAMINE 4 G RE ENEM: 4.0000 g | ENEMA | Freq: Every day | RECTAL | 2 refills | Status: DC

## 2017-12-18 NOTE — Discharge Instructions (Signed)
Follow up with Dr Lorin Mercy (Orthopedics) in 2 weeks to reevaluate your toes and to determine if amputations needed,  multiple necrotic toes  Medication noncompliance/Severe chronic diarrhea with electrolyte abnormalities, multiple necrotic toes  Nutrition [facility] Stay Proper nutrition can help your body recover from illness and injury.   Foods and beverages high in protein, vitamins, and minerals help rebuild muscle loss, promote healing, & reduce fall risk.   In addition to eating healthy foods, a nutrition shake is an easy, delicious way to get the nutrition you need during and after your hospital stay  It is recommended that you continue to drink 3 bottles per day of: Ensure Enlive for at least 1 month (30 days) after your hospital stay   Tips for adding a nutrition shake into your routine: As allowed, drink one with vitamins or medications instead of water or juice Enjoy one as a tasty mid-morning or afternoon snack Drink cold or make a milkshake out of it Drink one instead of milk with cereal or snacks Use as a coffee creamer   Available at the following grocery stores and pharmacies:           * [facility] [phone]            For COUPONS visit: www.ensure.com/join or http://dawson-may.com/   Suggested Substitutions Ensure Plus = Boost Plus = Carnation Breakfast Essentials = Boost Compact Ensure Active Clear = Boost Breeze Glucerna Shake = Boost Glucose Control = Carnation Breakfast Essentials SUGAR FREE   Follow up with Dr Lorin Mercy (Orthopedics) in 2 weeks to reevaluate your toes and to determine if amputations needed,  multiple necrotic toes  Medication noncompliance/Severe chronic diarrhea with electrolyte abnormalities, multiple necrotic toes

## 2017-12-18 NOTE — Progress Notes (Signed)
Call to mother again this am with no answer, VM left. [phone].

## 2017-12-18 NOTE — Discharge Summary (Signed)
See prior d/c summary

## 2017-12-18 NOTE — Discharge Summary (Signed)
Christian Lamb, is a 42 y.o. male  DOB Apr 01, 1976  MRN [id number].  Admission date:  12/04/2017  Admitting Physician  Norval Morton, MD  Discharge Date:  12/18/2017   Primary MD  [facility], [name]  Recommendations for primary care physician for things to follow:   Follow up with Dr Lorin Mercy (Orthopedics) in 2 weeks to reevaluate your toes and to determine if amputations needed,  multiple necrotic toes  Medication noncompliance/Severe chronic diarrhea with electrolyte abnormalities, multiple necrotic toes   Admission Diagnosis  Chronic diarrhea [K52.9] Acute cystitis with hematuria [N30.01] Acute kidney injury ([place]) [N17.9] Sepsis ([place]) [A41.9]   Discharge Diagnosis  Chronic diarrhea [K52.9] Acute cystitis with hematuria [N30.01] Acute kidney injury ([place]) [N17.9] Sepsis ([place]) [A41.9]    Principal Problem:   Sepsis ([place]) Active Problems:   Schizoaffective disorder, bipolar type ([place])   Tobacco use disorder   Ulcerative colitis with complication ([place])   Anemia of chronic disease   AKI (acute kidney injury) ([place])   Rhabdomyolysis   Elevated liver enzymes   Protein-calorie malnutrition, severe   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 Anemia   . Anxiety   . Schizophrenia ([place])   . Ulcerative colitis [facility])     Past Surgical History:  Procedure Laterality Date  . SMALL INTESTINE SURGERY  2016   in [place]  . TOTAL COLECTOMY  2003       HPI  from the history and physical done on the day of admission:    HPI: Christian Lamb is a 42 y.o. 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  He makes note that the  diarrhea symptoms have been ongoing over the last several months for which he has been under the care of Dr. Havery Moros of [facility].  However, however was last seen on 07/14/2017 for pouchoscopy and had been started on budesonide advised to follow-up, but has missed his recent appointments.  He reports having 6 or more loose mostly nonbloody diarrhea stools per day.  He tries to keep himself hydrated, but reports having to drink excessive amounts of water to try and keep up.  Everything that he eats runs right through him.  He makes note that he lives in a "junk house" where water and food is unsanitary.  Due to the symptoms patient reports lying in bed for extended periods of time as he is unable to always make it to the bathroom in time.  Over the last 2 days he reports having more shortness of breath with intermittent wheezing.  Associated complaints of chills, intermittent abdominal pain with bowel movements, intermittent blood in his stool( does not recall the last time this is occurred), dysuria, generalized weakness, loss of feeling in his feet, and bilateral foot swelling.  Denies any chest pain, cough, change in  vision, or focal weakness.  His caseworker come to see him today and had called EMS.  En route with EMS patient was given 10 mg of albuterol, 0.5 mg of Atrovent, 1500 mL of normal saline bolus.  ED Course: Upon admission into the emergency room patient was noted to be febrile up to 102.3 F, heart rates 140-152, respirations 11-20, blood pressures 90/71 to 115/75, and O2 saturations maintained on room air.  Labs revealed WBC 25.7, hemoglobin 11 potassium 5.2, BUN 56, creatinine 1.6, calcium 7.5, lactic acid 1.77, AST 164, ALT 88, total bilirubin 1.6.  Urine drug screen and alcohol level were noted to be unremarkable.  Chest x-ray negative for any acute abnormalities.  Urinalysis was positive for many bacteria, nitrites, and leukocyte esterase.  Sepsis protocol had been initiated with patient  being given 2 L of normal saline IV fluids with empiric antibiotics of vancomycin and Zosyn.  CPK level was pending as well as consult to vascular surgery due to discoloration of the lower extremities.       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 He has also reported feet were cold and turning dark in color. His caseworker came to see him on the day of the admission and called EMS for pt to be admitted to the hospital.  Assessment/Plan:  1)SepsisPresent onAdmission-sepsis criteria met on admission,pt hasNecrotic toes, blood cultures neg, urine culture with multiple species present, C. Diff neg, GI stool panel negative. Afebrile, Treated withiv Zosynfor possible bowel/intra-abdominal infection,leukocytosis resolved, from a peak of 32 k, at the time of discharge white count was 7.1  2)BLLowerExtremity,Frostbite the hisToes-Necrotic right second toe,venous Dopplernegative forDVTs. No evidence of osteomyelitis on x ray.Was Treated withIV Zosyn, orthopedicconsult appreciated,recommendedallowing demarcation of necrotic areasprior to further surgical recommendations,with subsequent outpatient follow-up with orthopedics to determine level of amputation, will f/u with Dr Lorin Mercy orthopedics post  Discharge in a couple weeks  3)AKIsuperimposed on CKD 3--resolved rhabdomyolysis , CKs have normalized, electrolytes and renal function is normalized,  metabolic acidosis induced by chronic diarrhea has resolved, patient was on bicarb tablets which helped  4)Ulcerative colitis  withchronicDiarrhea-Diarrheahas improved,Lomotil was changed to 4 times daily scheduled, use Imodium prn, patient had 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 LFT elevation likely from sepsis and rhabdomyolysis. hep panel and HIV negative. LFT'snormalized  6)Schizophrenia/SchizoaffectiveDisorder-Stable. continue Benztropine,Thorazine and patient getsInvega shots, next doseof Invega was3/12/19, d/w Pharmacist... Patient is to follow-up with outpatient psychiatrist to get Invega shots  7)ChronicAnemia.-Due to poor nutritional intake, and underlying chronic colitis, no evidence of ongoing GI bleed, Hgb is down trending up, hgb 9.9  8)Skin wounds- POA-Moisture associated skin damage to left hip and buttocks from excessive diarrhea and exposure to urine and stool during acute illness, present on admission.  - Left hip: 22 cm x 3 cm x 0.2 cm. Buttocks: Both gluteal areas affected: Each measures: 8 cm x 6 cm x 0.2 cm. WOC recommendations: Cleanse buttocks and left  hip wounds with NS and pat dry. Apply Gerhardts butt paste twice daily and PRN soilage. No disposable briefs or underpads while in bed.   9)Severe PCM, underweight, hypoalbuminemia- BMI 16 ,nutritional supplements vitamins as recommended by dietitian, albumin was 1.5  10)TobaccoAbuse---continue Nicotine patch  11)Disposition-patient and momrepeatedlydeclined transfer to skilled nursing facility for rehab, they want discharge [facility], on [place] has improved.  ,  Code Status:full Family Communication:d/w patient, previously spoke with his mother at [phone]---  Disposition Plan: Home with Home health,spoke with his mother at [phone]---, patient and his mother continue to decline placement to skilled nursing facility for Rehab and to improve medication compliance  Consultants:  [facility]  Ortho  Procedures:  Rectal tube insertion  Antibiotics:  Zosyn02/28 -->  Vancomycin 03/03 -->  Discharge Condition: stable  Follow UP  Follow-up Information    Marybelle Killings, MD Follow up in 3 week(s).   Specialty:  Orthopedic Surgery Contact information: [place] [address] [address], [facility] Follow up.   Specialty:  Home Health Services Why:  [facility] will Call you in 24 to 48 hours after discharge. I you have not heard from [facility] call [phone]. Contact information: [address] [place] [address] [phone]            Consults obtained - Gi/Ortho  Diet and Activity recommendation:  As advised  Discharge Instructions     Discharge Instructions    Call MD for:   Complete by:  As directed    Follow up with Dr Lorin Mercy (Orthopedics) in 2 weeks to reevaluate your toes and to determine if amputations needed,  multiple necrotic toes   Call MD for:  difficulty breathing, headache or visual disturbances   Complete by:  As directed    Call MD for:  persistant dizziness or light-headedness   Complete by:  As directed    Call MD for:  persistant nausea and vomiting   Complete by:  As directed    Call MD for:  redness, tenderness, or signs of infection (pain, swelling, redness, odor or green/yellow discharge around incision site)   Complete by:  As directed    Call MD for:  severe uncontrolled pain   Complete by:  As directed    Call MD for:  temperature >100.4   Complete by:  As directed    Diet general   Complete by:  As directed     Discharge instructions   Complete by:  As directed    1)Medication noncompliance/Severe chronic diarrhea with electrolyte abnormalities, multiple necrotic toes  2)Take medications as prescribed  3)Follow-up with psychiatry to get Roland Earl injections  4)Follow up with Dr Lorin Mercy (Orthopedics) in 2 weeks to reevaluate your toes and to determine if amputations needed,  multiple necrotic toes   Increase activity slowly   Complete by:  As directed         Discharge Medications     Allergies as of 12/18/2017   No Known Allergies     Medication List    TAKE these medications   atenolol 25 MG tablet Commonly known as:  TENORMIN Take 0.5 tablets (12.5 mg total) by mouth daily. Start taking on:  12/19/2017   b complex vitamins tablet Take 1 tablet by mouth daily. What changed:  how much to take   benztropine 0.5 MG tablet Commonly known  as:  COGENTIN Take 1 tablet (0.5 mg total) by mouth Nightly.   budesonide 3 MG 24 hr capsule Commonly known as:  ENTOCORT EC Take 3 capsules (9 mg total) by mouth daily. Start taking on:  12/19/2017   chlorproMAZINE 25 MG tablet Commonly known as:  THORAZINE Take 1 tablet (25 mg total) by mouth 3 (three) times daily as needed for hiccoughs.   diphenoxylate-atropine 2.5-0.025 MG tablet Commonly known as:  LOMOTIL Take 1 tablet by mouth 4 (four) times daily.   feeding supplement (ENSURE ENLIVE) Liqd Take 237 mLs by mouth 2 (two) times daily between meals.   folic acid 1 MG tablet Commonly known as:  FOLVITE Take 1 tablet (1 mg total) by mouth daily. Start taking on:  12/19/2017   Gerhardt's butt cream Crea Apply 1 application topically 2 (two) times daily.   ipratropium-albuterol 0.5-2.5 (3) MG/3ML Soln Commonly known as:  DUONEB Take 3 mLs by nebulization every 4 (four) hours as needed.   loperamide 2 MG capsule Commonly known as:  IMODIUM Take 1 capsule (2 mg total) by mouth 3 (three) times daily. For diarrhea   LORazepam  0.5 MG tablet Commonly known as:  ATIVAN Take 1 tablet (0.5 mg total) by mouth every 4 (four) hours as needed for anxiety.   mesalamine 4 g enema Commonly known as:  ROWASA Place 60 mLs (4 g total) rectally at bedtime.   multivitamin Tabs tablet Take 1 tablet by mouth daily. Start taking on:  12/19/2017   nicotine 21 mg/24hr patch Commonly known as:  NICODERM CQ - dosed in mg/24 hours Place 1 patch (21 mg total) onto the skin daily.   ondansetron 4 MG tablet Commonly known as:  ZOFRAN Take 1 tablet (4 mg total) by mouth every 6 (six) hours as needed for nausea.   oxyCODONE 5 MG immediate release tablet Commonly known as:  Oxy IR/ROXICODONE Take 1 tablet (5 mg total) by mouth every 4 (four) hours as needed for moderate pain or severe pain.   paliperidone 156 MG/ML Susp injection Commonly known as:  INVEGA SUSTENNA Inject 1 mL (156 mg total) into the muscle once.   paliperidone 6 MG 24 hr tablet Commonly known as:  INVEGA Take 2 tablets (12 mg total) by mouth at bedtime.   sodium bicarbonate 650 MG tablet Take 1 tablet (650 mg total) by mouth daily. Start taking on:  12/19/2017   traZODone 50 MG tablet Commonly known as:  DESYREL Take 1 tablet (50 mg total) by mouth at bedtime as needed for sleep.       Major procedures and Radiology Reports - PLEASE review detailed and final reports for all details, in brief -    Dg Chest 2 View  Result Date: 12/04/2017 CLINICAL DATA:  Shortness of breath. EXAM: CHEST  2 VIEW COMPARISON:  08/04/2008. FINDINGS: Mediastinum hilar structures are normal. Heart size normal. No focal infiltrate. No pleural effusion or pneumothorax. Thoracic spine scoliosis. IMPRESSION: No acute cardiopulmonary disease. Electronically Signed   By: Marcello Moores  Register   On: 12/04/2017 16:05   Dg Chest Port 1 View  Result Date: 12/04/2017 CLINICAL DATA:  42 year old male with weight loss, diarrhea, and sepsis. EXAM: PORTABLE CHEST 1 VIEW COMPARISON:  Chest  radiograph dated 12/04/2017 FINDINGS: The lungs are clear. There is no pleural effusion pneumothorax. The cardiac silhouette is within normal limits. No acute osseous pathology. IMPRESSION: No active disease. Electronically Signed   By: Anner Crete M.D.   On: 12/04/2017 21:15  Dg Foot Complete Left  Result Date: 12/04/2017 CLINICAL DATA:  Foot pain.  Information. EXAM: LEFT FOOT - COMPLETE 3+ VIEW COMPARISON:  None. FINDINGS: No acute bony abnormality identified. No evidence of fracture dislocation. Mild degenerative changes first MTP joint. No evidence of inflammatory arthropathy. No radiopaque foreign body. IMPRESSION: Mild degenerative changes first MTP joint. No inflammatory arthropathy. No acute bony abnormality. Electronically Signed   By: Marcello Moores  Register   On: 12/04/2017 16:08   Dg Foot Complete Right  Result Date: 12/04/2017 CLINICAL DATA:  Foot pain with inflammation EXAM: RIGHT FOOT COMPLETE - 3+ VIEW COMPARISON:  Left foot radiographs 12/04/2017 FINDINGS: No fracture or malalignment. No definite periostitis or bone destruction. No radiopaque foreign body or soft tissue gas. IMPRESSION: No definite acute osseous abnormality Electronically Signed   By: Donavan Foil M.D.   On: 12/04/2017 16:07    Micro Results    No results found for this or any previous visit (from the past 240 hour(s)).     Today   Subjective    Christian Lamb today has no new complaints, diarrhea has improved significantly, no fevers, no abdominal pain, no vomiting, eating and drinking well, patient and his mother continue to refuse transfer to skilled nursing facility for rehab          Patient has been seen and examined prior to discharge   Objective   Blood pressure 102/62, pulse 91, temperature 98.2 F (36.8 C), temperature source Oral, resp. rate 18, height 6' (1.829 m), weight 54.4 kg (120 lb), SpO2 99 %.   Intake/Output Summary (Last 24 hours) at 12/18/2017 1724 Last data filed at  12/18/2017 1350 Gross per 24 hour  Intake 120 ml  Output 1425 ml  Net -1305 ml    Exam Gen:- Awake Alert, cachetic HEENT:- [place].AT,No sclera icterus Neck-Supple Neck,No JVD,.  Lungs- CTAB, no wheezing CV- S1, S2 normal, HR 96 Abd- +ve B.Sounds, Abd Soft, Notenderness,  Extremity/Skin:-Necrotic changes and frostbite related changes of multiple toes bilaterally Psych-affect is appropriate, oriented x3 Neuro-no new focal deficits, generalized weakness     Data Review   CBC w Diff:  Lab Results  Component Value Date   WBC 7.1 12/18/2017   HGB 9.9 (L) 12/18/2017   HCT 33.0 (L) 12/18/2017   PLT 471 (H) 12/18/2017   LYMPHOPCT 10 12/04/2017   MONOPCT 4 12/04/2017   EOSPCT 0 12/04/2017   BASOPCT 0 12/04/2017    CMP:  Lab Results  Component Value Date   NA 140 12/18/2017   K 4.0 12/18/2017   CL 104 12/18/2017   CO2 29 12/18/2017   BUN 15 12/18/2017   CREATININE 0.76 12/18/2017   PROT 7.9 12/18/2017   ALBUMIN 2.0 (L) 12/18/2017   BILITOT 0.5 12/18/2017   ALKPHOS 67 12/18/2017   AST 17 12/18/2017   ALT 34 12/18/2017  .   Total Discharge time is about 33 minutes  Roxan Hockey M.D on 12/18/2017 at 5:24 PM  [facility]   Office  [phone]  Voice Recognition Viviann Spare dictation system was used to create this note, attempts have been made to correct errors. Please contact the author with questions and/or clarifications.

## 2017-12-19 NOTE — Discharge Summary (Signed)
See prior d/c summary

## 2017-12-22 ENCOUNTER — Telehealth (INDEPENDENT_AMBULATORY_CARE_PROVIDER_SITE_OTHER): Payer: Self-pay | Admitting: Orthopaedic Surgery

## 2017-12-22 NOTE — Telephone Encounter (Signed)
Nicolette with [facility] is trying to find a provider to approve orders for home health visits for this patient. She did say there was a pending appt for Dr. Lorin Mercy from an ER visit 12/04/17. She did say patient has some mental issues and is not capable of scheduling an appt. If we can ok verbal orders, she is requesting   1 x for 1 week 2 x for 1 week 1 x for 7 weeks 3 PRN Visits  CB# [phone]

## 2017-12-23 NOTE — Telephone Encounter (Signed)
Ok thanks 

## 2017-12-23 NOTE — Telephone Encounter (Signed)
I called Christian Lamb and advised. Also advised patient does not have follow up appt with [place] if someone is there to help him schedule when they visit.

## 2017-12-23 NOTE — Telephone Encounter (Signed)
Please advise. Patient does not have appt scheduled with you as of now.

## 2017-12-31 ENCOUNTER — Ambulatory Visit: Payer: Self-pay | Admitting: Nurse Practitioner

## 2018-01-05 DIAGNOSIS — M869 Osteomyelitis, unspecified: Secondary | ICD-10-CM

## 2018-01-05 HISTORY — DX: Osteomyelitis, unspecified: M86.9

## 2018-01-06 ENCOUNTER — Ambulatory Visit (INDEPENDENT_AMBULATORY_CARE_PROVIDER_SITE_OTHER): Payer: Self-pay | Admitting: Orthopaedic Surgery

## 2018-01-09 ENCOUNTER — Ambulatory Visit (INDEPENDENT_AMBULATORY_CARE_PROVIDER_SITE_OTHER): Payer: Self-pay | Admitting: Orthopaedic Surgery

## 2018-01-12 ENCOUNTER — Telehealth (INDEPENDENT_AMBULATORY_CARE_PROVIDER_SITE_OTHER): Payer: Self-pay | Admitting: Orthopaedic Surgery

## 2018-01-12 NOTE — Telephone Encounter (Signed)
Please advise on PT.

## 2018-01-12 NOTE — Telephone Encounter (Signed)
I did not order the PT but fine to send out HHPT for ambulation.  He is very weak. thanks

## 2018-01-12 NOTE — Telephone Encounter (Signed)
Patients mother called on behalf of patient wanting to let you know that PT hasn't contacted him yet and he is really needing to be seen. If you could give her a call back at [phone]

## 2018-01-13 NOTE — Telephone Encounter (Signed)
I called and spoke with Nicolette with [facility].  They have been seeing the patient for home health nurse visits. I gave the verbal order for physical therapy.  They will have someone out to evaluate the patient by the end of this week or the beginning of next week.  I left voicemail for patient's mother advising.

## 2018-01-16 ENCOUNTER — Inpatient Hospital Stay (HOSPITAL_COMMUNITY)
Admission: EM | Admit: 2018-01-16 | Discharge: 2018-01-21 | DRG: 240 | Disposition: A | Discharge status: Skilled Nursing Facility | Payer: Medicaid Other | Attending: Family Medicine | Admitting: Family Medicine

## 2018-01-16 ENCOUNTER — Emergency Department (HOSPITAL_COMMUNITY): Payer: Medicaid Other

## 2018-01-16 ENCOUNTER — Encounter (HOSPITAL_COMMUNITY): Payer: Self-pay | Admitting: Emergency Medicine

## 2018-01-16 DIAGNOSIS — F419 Anxiety disorder, unspecified: Secondary | ICD-10-CM | POA: Diagnosis present

## 2018-01-16 DIAGNOSIS — L97529 Non-pressure chronic ulcer of other part of left foot with unspecified severity: Secondary | ICD-10-CM | POA: Diagnosis present

## 2018-01-16 DIAGNOSIS — Z79899 Other long term (current) drug therapy: Secondary | ICD-10-CM

## 2018-01-16 DIAGNOSIS — M869 Osteomyelitis, unspecified: Secondary | ICD-10-CM | POA: Diagnosis present

## 2018-01-16 DIAGNOSIS — J449 Chronic obstructive pulmonary disease, unspecified: Secondary | ICD-10-CM | POA: Diagnosis present

## 2018-01-16 DIAGNOSIS — M868X7 Other osteomyelitis, ankle and foot: Secondary | ICD-10-CM | POA: Diagnosis present

## 2018-01-16 DIAGNOSIS — M19071 Primary osteoarthritis, right ankle and foot: Secondary | ICD-10-CM

## 2018-01-16 DIAGNOSIS — M86271 Subacute osteomyelitis, right ankle and foot: Secondary | ICD-10-CM | POA: Diagnosis not present

## 2018-01-16 DIAGNOSIS — L97519 Non-pressure chronic ulcer of other part of right foot with unspecified severity: Secondary | ICD-10-CM | POA: Diagnosis present

## 2018-01-16 DIAGNOSIS — Z9049 Acquired absence of other specified parts of digestive tract: Secondary | ICD-10-CM

## 2018-01-16 DIAGNOSIS — F17210 Nicotine dependence, cigarettes, uncomplicated: Secondary | ICD-10-CM | POA: Diagnosis present

## 2018-01-16 DIAGNOSIS — F250 Schizoaffective disorder, bipolar type: Secondary | ICD-10-CM | POA: Diagnosis present

## 2018-01-16 DIAGNOSIS — M8600 Acute hematogenous osteomyelitis, unspecified site: Secondary | ICD-10-CM | POA: Diagnosis not present

## 2018-01-16 DIAGNOSIS — I10 Essential (primary) hypertension: Secondary | ICD-10-CM | POA: Diagnosis present

## 2018-01-16 DIAGNOSIS — M8618 Other acute osteomyelitis, other site: Secondary | ICD-10-CM

## 2018-01-16 DIAGNOSIS — F17200 Nicotine dependence, unspecified, uncomplicated: Secondary | ICD-10-CM | POA: Diagnosis present

## 2018-01-16 DIAGNOSIS — K529 Noninfective gastroenteritis and colitis, unspecified: Secondary | ICD-10-CM | POA: Diagnosis present

## 2018-01-16 DIAGNOSIS — K51919 Ulcerative colitis, unspecified with unspecified complications: Secondary | ICD-10-CM | POA: Diagnosis present

## 2018-01-16 DIAGNOSIS — I96 Gangrene, not elsewhere classified: Secondary | ICD-10-CM

## 2018-01-16 HISTORY — DX: Gangrene, not elsewhere classified: I96

## 2018-01-16 HISTORY — DX: Osteomyelitis, unspecified: M86.9

## 2018-01-16 LAB — CBC WITH DIFFERENTIAL/PLATELET
Basophils Absolute: 0 10*3/uL (ref 0.0–0.1)
Basophils Relative: 0 %
EOS ABS: 0.2 10*3/uL (ref 0.0–0.7)
EOS PCT: 2 %
HCT: 39.5 % (ref 39.0–52.0)
HEMOGLOBIN: 12.3 g/dL — AB (ref 13.0–17.0)
Lymphocytes Relative: 15 %
Lymphs Abs: 1.7 10*3/uL (ref 0.7–4.0)
MCH: 28.8 pg (ref 26.0–34.0)
MCHC: 31.1 g/dL (ref 30.0–36.0)
MCV: 92.5 fL (ref 78.0–100.0)
Monocytes Absolute: 1.1 10*3/uL — ABNORMAL HIGH (ref 0.1–1.0)
Monocytes Relative: 10 %
NEUTROS ABS: 8.5 10*3/uL — AB (ref 1.7–7.7)
NEUTROS PCT: 73 %
PLATELETS: 268 10*3/uL (ref 150–400)
RBC: 4.27 MIL/uL (ref 4.22–5.81)
RDW: 13.8 % (ref 11.5–15.5)
WBC: 11.5 10*3/uL — AB (ref 4.0–10.5)

## 2018-01-16 LAB — BASIC METABOLIC PANEL
Anion gap: 9 (ref 5–15)
BUN: 21 mg/dL — AB (ref 6–20)
CHLORIDE: 103 mmol/L (ref 101–111)
CO2: 26 mmol/L (ref 22–32)
CREATININE: 0.96 mg/dL (ref 0.61–1.24)
Calcium: 9.1 mg/dL (ref 8.9–10.3)
GFR calc Af Amer: >60 mL/min (ref >60)
GFR calc non Af Amer: >60 mL/min (ref >60)
GLUCOSE: 91 mg/dL (ref 65–99)
Potassium: 4.2 mmol/L (ref 3.5–5.1)
SODIUM: 138 mmol/L (ref 135–145)

## 2018-01-16 LAB — I-STAT CG4 LACTIC ACID, ED: LACTIC ACID, VENOUS: 0.62 mmol/L (ref 0.5–1.9)

## 2018-01-16 MED ORDER — VANCOMYCIN HCL 10 G IV SOLR
1500.0000 mg | Freq: Once | INTRAVENOUS | Status: AC
  Administered 2018-01-16: 1500 mg via INTRAVENOUS
  Filled 2018-01-16: qty 1500

## 2018-01-16 MED ORDER — VANCOMYCIN HCL 10 G IV SOLR
1250.0000 mg | INTRAVENOUS | Status: DC
  Administered 2018-01-17 – 2018-01-19 (×3): 1250 mg via INTRAVENOUS
  Filled 2018-01-16 (×6): qty 1250

## 2018-01-16 MED ORDER — CEFEPIME HCL 2 G IJ SOLR
2.0000 g | Freq: Once | INTRAMUSCULAR | Status: AC
  Administered 2018-01-16: 2 g via INTRAVENOUS
  Filled 2018-01-16: qty 2

## 2018-01-16 MED ORDER — PIPERACILLIN-TAZOBACTAM 3.375 G IVPB
3.3750 g | Freq: Three times a day (TID) | INTRAVENOUS | Status: DC
  Administered 2018-01-17 – 2018-01-20 (×9): 3.375 g via INTRAVENOUS
  Filled 2018-01-16 (×12): qty 50

## 2018-01-16 NOTE — H&P (Addendum)
History and Physical    [name] QAS:[id number] DOB: [birth year] DOA: 01/16/2018  PCP: [name]   Patient coming from: Home   Chief Complaint: Drainage from right toes  HPI: Christian Lamb is a 42 y.o. male with medical history significant for schizo-affective disorder, ulcerative colitis with chronic diarrhea, tobacco abuse who presented to the ED with complaints of drainage from his right toes of one day duration.  Patient's cannot feel his toes so he cannot tell if there is pain.  No reports of redness.  No fever or chills no vomiting, no malaise. Recent hospital admission 2/28- 12/18/17, managed for diarrhea, but was found to have discolored toes-first bite.  At that time he reported he lived in a junk house, water and food were unsanitary.  X-rays were negative for osteomyelitis.  Orthopedics was consulted inpatient, recommendations were for patient to follow-up as an outpatient to determine level of amputation.  Patient had appointment scheduled for April 5th, patient's said he missed that appointment because it was raining did not want to get his feet wet, rescheduled for April 24th.  ED Course: Mild intermittent tachycardia to 104.  Systolic blood pressure low 100s. WBC-11.5.  Lactic acid within normal limits 0.62.  X-ray of the right foot suggested early erosion of grade to 12th that is new presumed osteomyelitis.  He was started on IV vancomycin and Zosyn in the ED. Dr. to the orthopedics was consulted with recommendations to admit under hospitalist service to [facility]: As per HPI otherwise 10 point review of systems negative.   Past Medical History:  Diagnosis Date  . Anemia   . Anxiety   . Schizophrenia ([place])   . Ulcerative colitis [facility])     Past Surgical History:  Procedure Laterality Date  . SMALL INTESTINE SURGERY  2016   in [place]  . TOTAL COLECTOMY  2003     reports that he has been smoking cigarettes  and cigars.  He has been smoking about 0.25 packs per day. He has never used smokeless tobacco. He reports that he does not drink alcohol or use drugs.  No Known Allergies  Family History  Problem Relation Age of Onset  . Colon cancer Father 52  . Stomach cancer Neg Hx     Prior to Admission medications   Medication Sig Start Date End Date Taking? Authorizing Provider  atenolol (TENORMIN) 25 MG tablet Take 0.5 tablets (12.5 mg total) by mouth daily. 12/19/17  Yes Dawit Tankard, Courage, MD  b complex vitamins tablet Take 1 tablet by mouth daily. 12/18/17  Yes Alexus Galka, Courage, MD  benztropine (COGENTIN) 0.5 MG tablet Take 1 tablet (0.5 mg total) by mouth Nightly. 12/18/17  Yes Karissa Meenan, Courage, MD  budesonide (ENTOCORT EC) 3 MG 24 hr capsule Take 3 capsules (9 mg total) by mouth daily. 12/19/17  Yes Nakaya Mishkin, Courage, MD  chlorproMAZINE (THORAZINE) 25 MG tablet Take 1 tablet (25 mg total) by mouth 3 (three) times daily as needed for hiccoughs. 12/18/17  Yes Audrie Kuri, Courage, MD  diphenoxylate-atropine (LOMOTIL) 2.5-0.025 MG tablet Take 1 tablet by mouth 4 (four) times daily. 12/18/17  Yes Cintya Daughety, Courage, MD  folic acid (FOLVITE) 1 MG tablet Take 1 tablet (1 mg total) by mouth daily. 12/19/17  Yes Tra Wilemon, Courage, MD  Hydrocortisone (GERHARDT'S BUTT CREAM) CREA Apply 1 application topically 2 (two) times daily. 12/18/17  Yes Crescentia Boutwell, Courage, MD  ipratropium-albuterol (DUONEB) 0.5-2.5 (3) MG/3ML SOLN Take 3 mLs by nebulization every 4 (  four) hours as needed. 12/18/17  Yes Aubrea Meixner, Courage, MD  loperamide (IMODIUM) 2 MG capsule Take 1 capsule (2 mg total) by mouth 3 (three) times daily. For diarrhea 12/18/17  Yes Doni Widmer, Courage, MD  LORazepam (ATIVAN) 0.5 MG tablet Take 1 tablet (0.5 mg total) by mouth every 4 (four) hours as needed for anxiety. 12/18/17  Yes Elease Swarm, Courage, MD  mesalamine (ROWASA) 4 g enema Place 60 mLs (4 g total) rectally at bedtime. 12/18/17  Yes Nicolemarie Wooley, Courage, MD  multivitamin  (PROSIGHT) TABS tablet Take 1 tablet by mouth daily. 12/19/17  Yes Shaquira Moroz, Courage, MD  ondansetron (ZOFRAN) 4 MG tablet Take 1 tablet (4 mg total) by mouth every 6 (six) hours as needed for nausea. 12/18/17  Yes Kyndle Schlender, Courage, MD  paliperidone (INVEGA) 6 MG 24 hr tablet Take 2 tablets (12 mg total) by mouth at bedtime. 12/18/17  Yes Kelsie Zaborowski, Courage, MD  sodium bicarbonate 650 MG tablet Take 1 tablet (650 mg total) by mouth daily. 12/19/17  Yes Roxan Hockey, MD  traZODone (DESYREL) 50 MG tablet Take 1 tablet (50 mg total) by mouth at bedtime as needed for sleep. 12/18/17  Yes Donnetta Gillin, Courage, MD  feeding supplement, ENSURE ENLIVE, (ENSURE ENLIVE) LIQD Take 237 mLs by mouth 2 (two) times daily between meals. 12/18/17   Roxan Hockey, MD  nicotine (NICODERM CQ - DOSED IN MG/24 HOURS) 21 mg/24hr patch Place 1 patch (21 mg total) onto the skin daily. Patient not taking: Reported on 01/16/2018 12/18/17   Roxan Hockey, MD  oxyCODONE (OXY IR/ROXICODONE) 5 MG immediate release tablet Take 1 tablet (5 mg total) by mouth every 4 (four) hours as needed for moderate pain or severe pain. Patient not taking: Reported on 01/16/2018 12/18/17   Roxan Hockey, MD  paliperidone (INVEGA SUSTENNA) 156 MG/ML SUSP injection Inject 1 mL (156 mg total) into the muscle once. Patient not taking: Reported on 01/16/2018 02/20/16   Hildred Priest, MD    Physical Exam: Vitals:   01/16/18 2030 01/16/18 2100 01/16/18 2130 01/16/18 2200  BP: 106/68 104/66 107/67 101/62  Pulse: 95 92 (!) 104 90  Resp: 15  16 14   Temp:      TempSrc:      SpO2: 100% 100% 100% 99%  Weight:      Height:        Constitutional: NAD, calm, comfortable Vitals:   01/16/18 2030 01/16/18 2100 01/16/18 2130 01/16/18 2200  BP: 106/68 104/66 107/67 101/62  Pulse: 95 92 (!) 104 90  Resp: 15  16 14   Temp:      TempSrc:      SpO2: 100% 100% 100% 99%  Weight:      Height:       Eyes: PERRL, lids and conjunctivae normal ENMT:  Mucous membranes are dry. Posterior pharynx clear of any exudate or lesions.Normal dentition.  Neck: normal, supple, no masses, no thyromegaly Respiratory: clear to auscultation bilaterally, no wheezing, no crackles. Normal respiratory effort. No accessory muscle use.  Cardiovascular: Regular rate and rhythm, no murmurs / rubs / gallops. No extremity edema. 2+ pedal pulses-dorsalis pedis bilaterally. .  Abdomen: no tenderness, no masses palpated. No hepatosplenomegaly. Bowel sounds positive.  Musculoskeletal: Bilateral lower extremity skin extremely dry with scaling, toes bilateral feet black appearing Right worse thane left, drainage from ulcers between toes on the right. Good ROM, no contractures. Normal muscle tone.  Skin: Skin breakdown between toes on the right, toe discolorations Neurologic: CN 2-12 grossly intact.  Strength 5/5 in all 4.  Psychiatric: Normal judgment and insight. Alert and oriented x 3. Normal mood.        Labs on Admission: I have personally reviewed following labs and imaging studies  CBC: Recent Labs  Lab 01/16/18 1910  WBC 11.5*  NEUTROABS 8.5*  HGB 12.3*  HCT 39.5  MCV 92.5  PLT 536   Basic Metabolic Panel: Recent Labs  Lab 01/16/18 1910  NA 138  K 4.2  CL 103  CO2 26  GLUCOSE 91  BUN 21*  CREATININE 0.96  CALCIUM 9.1    Radiological Exams on Admission: Dg Foot Complete Right  Result Date: 01/16/2018 CLINICAL DATA:  Black toes.  Evaluate for osteomyelitis. EXAM: RIGHT FOOT COMPLETE - 3+ VIEW COMPARISON:  12/04/2017 FINDINGS: There is newly seen cortical erosion involving the tuft of the toe. No fracture or malalignment. No soft tissue gas or opaque foreign body. IMPRESSION: Early erosion of the great toe tuft that is new from 12/04/2017, presumed osteomyelitis. Electronically Signed   By: Monte Fantasia M.D.   On: 01/16/2018 18:34    EKG: None  Assessment/Plan Principal Problem:   Osteomyelitis (HCC) Active Problems:   Schizoaffective  disorder, bipolar type (HCC)   Tobacco use disorder   Chronic diarrhea   Ulcerative colitis with complication (HCC)   Necrotic toes, osteomyelitis- 2/2 frostbite right worse than left. Good DP pulses bilat. with ulcers and osteomyelitis of the big toe suggested on right foot x-ray.  Intermittent tachycardia in ED-history of chronic tachycardia, otherwise patient does not meet sepsis criteria.  Lactic acid 0.62. -Continue IV vancomycin and Zosyn started in ED -Orthopedics consulted in ED follow-up recs -N.p.o. from midnight pending Ortho eval -CBC BMP a.m. while on antibiotics - Will get blood cultures ( received antibiotics) - Soft bp, dry membranes- Ns 100cc/hr X 12 hrs  Schizoaffective disorder-stable -Continue home Invega, benztropine, trazodone, Thorazine as needed -New home PRN Ativan   Ulcerative colitis and chronic diarrhea-status post colectomy with ileal pouch-anal anastomosis.  Endoscopic evaluation of his pouch in June 2018 suggested possible Crohn's disease.  - Cont home mesalamine -Continue home Imodium as needed, if diarrhea resume scheduled lomotil  Chronic tachycardia- possibly related to benztropine use -Continue home low-dose atenolol 12.5 mg daily    DVT prophylaxis: Scds Code Status: Full Family Communication: None at bedside Disposition Plan: > 2 days, may need placement Consults called: Dr. Sharol Given Admission status: Inpt, med-surg    Bethena Roys MD [facility] Pager [phone] From 6PM-2AM.  Otherwise please contact night-coverage [email or web address] Password [email or web address]  01/16/2018, 10:33 PM

## 2018-01-16 NOTE — ED Notes (Signed)
Bed: WA09 Expected date:  Expected time:  Means of arrival:  Comments: Bed 6

## 2018-01-16 NOTE — Progress Notes (Signed)
Pharmacy Antibiotic Note  Christian Lamb is a 42 y.o. male admitted on 01/16/2018 with Osteomyelitis.  Pharmacy has been consulted for Vancomycin and Zosyn  dosing.  Plan: Cefepime 2gm iv x1 Zosyn 3.375g IV q8h (4 hour infusion).   Vancomycin 1.5gm iv x1, then 1269m iv q24hr  Goal AUC = 400 - 500 for all indications, except meningitis (goal AUC > 500 and Cmin 15-20 mcg/mL)   Height: 6' (182.9 cm) Weight: 130 lb (59 kg) IBW/kg (Calculated) : 77.6  Temp (24hrs), Avg:98.2 F (36.8 C), Min:98.1 F (36.7 C), Max:98.2 F (36.8 C)  Recent Labs  Lab 01/16/18 1910 01/16/18 1916  WBC 11.5*  --   CREATININE 0.96  --   LATICACIDVEN  --  0.62    Estimated Creatinine Clearance: 83.7 mL/min (by C-G formula based on SCr of 0.96 mg/dL).    No Known Allergies  Antimicrobials this admission: Vancomycin 01/16/2018 >> Cefepime 01/16/2018 x1 Zosyn 01/16/2018 >>    Dose adjustments this admission: -  Microbiology results: -  Thank you for allowing pharmacy to be a part of this patient's care.  Christian Lamb 01/16/2018 11:58 PM

## 2018-01-16 NOTE — ED Provider Notes (Addendum)
[facility] DEPT Provider Note   CSN: [id number] Arrival date & time: 01/16/18  1623     History   Chief Complaint Chief Complaint  Patient presents with  . foot drainage    HPI Christian Lamb is a 42 y.o. male presenting for evaluation of right foot drainage.  Pt states he was in the hospital last month and found to have frostbite of bilateral feet.  Since then, he has had black skin changes of bilateral toes.  Today, he noticed drainage from his toes of his right foot.  He states he has no sensation of his distal feet, so cannot tell if they are hurting.  He denies fevers, chills, chest pain or shortness breath, nausea, vomiting, abdominal pain, or swelling.  He has not on any antibiotics currently.  He has an appointment with Dr. Lorin Mercy in 2 wks.   HPI  Past Medical History:  Diagnosis Date  . Anemia   . Anxiety   . Schizophrenia ([place])   . Ulcerative colitis [facility])     Patient Active Problem List   Diagnosis Date Noted  . Osteomyelitis ([place]) 01/16/2018  . 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 Anemia of chronic disease 12/05/2017  . AKI (acute kidney injury) ([place]) 12/05/2017  . Rhabdomyolysis 12/05/2017  . Elevated liver enzymes 12/05/2017  . Protein-calorie malnutrition, severe 12/05/2017  . Sepsis ([place]) 12/04/2017  . Loss of weight 03/21/2017  . Chronic diarrhea 03/21/2017  . Ulcerative colitis with complication ([place]) 22/11/5425  . Tobacco use disorder 02/12/2016  . Schizoaffective disorder, bipolar type ([place]) 02/11/2016  . Chronic tachycardia 01/13/2015  . Chronic ulcerative enterocolitis ([place]) 01/13/2015  . 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 SMALL INTESTINE SURGERY  2016   in [place]  . TOTAL COLECTOMY  2003        Home Medications    Prior to Admission medications   Medication Sig Start Date End Date Taking? Authorizing Provider  atenolol (TENORMIN) 25 MG  tablet Take 0.5 tablets (12.5 mg total) by mouth daily. 12/19/17  Yes Emokpae, Courage, MD  b complex vitamins tablet Take 1 tablet by mouth daily. 12/18/17  Yes Emokpae, Courage, MD  benztropine (COGENTIN) 0.5 MG tablet Take 1 tablet (0.5 mg total) by mouth Nightly. 12/18/17  Yes Emokpae, Courage, MD  budesonide (ENTOCORT EC) 3 MG 24 hr capsule Take 3 capsules (9 mg total) by mouth daily. 12/19/17  Yes Emokpae, Courage, MD  chlorproMAZINE (THORAZINE) 25 MG tablet Take 1 tablet (25 mg total) by mouth 3 (three) times daily as needed for hiccoughs. 12/18/17  Yes Emokpae, Courage, MD  diphenoxylate-atropine (LOMOTIL) 2.5-0.025 MG tablet Take 1 tablet by mouth 4 (four) times daily. 12/18/17  Yes Emokpae, Courage, MD  folic acid (FOLVITE) 1 MG tablet Take 1 tablet (1 mg total) by mouth daily. 12/19/17  Yes Emokpae, Courage, MD  Hydrocortisone (GERHARDT'S BUTT CREAM) CREA Apply 1 application topically 2 (two) times daily. 12/18/17  Yes Emokpae, Courage, MD  ipratropium-albuterol (DUONEB) 0.5-2.5 (3) MG/3ML SOLN Take 3 mLs by nebulization every 4 (four) hours as needed. 12/18/17  Yes Emokpae, Courage, MD  loperamide (IMODIUM) 2 MG capsule Take 1 capsule (2 mg total) by mouth 3 (three) times daily. For diarrhea 12/18/17  Yes Emokpae, Courage, MD  LORazepam (ATIVAN) 0.5 MG tablet Take 1 tablet (0.5 mg total) by mouth every 4 (four) hours as needed for anxiety. 12/18/17  Yes Roxan Hockey, MD  mesalamine (ROWASA) 4  g enema Place 60 mLs (4 g total) rectally at bedtime. 12/18/17  Yes Emokpae, Courage, MD  multivitamin (PROSIGHT) TABS tablet Take 1 tablet by mouth daily. 12/19/17  Yes Emokpae, Courage, MD  ondansetron (ZOFRAN) 4 MG tablet Take 1 tablet (4 mg total) by mouth every 6 (six) hours as needed for nausea. 12/18/17  Yes Emokpae, Courage, MD  paliperidone (INVEGA) 6 MG 24 hr tablet Take 2 tablets (12 mg total) by mouth at bedtime. 12/18/17  Yes Emokpae, Courage, MD  sodium bicarbonate 650 MG tablet Take 1 tablet (650  mg total) by mouth daily. 12/19/17  Yes Roxan Hockey, MD  traZODone (DESYREL) 50 MG tablet Take 1 tablet (50 mg total) by mouth at bedtime as needed for sleep. 12/18/17  Yes Emokpae, Courage, MD  feeding supplement, ENSURE ENLIVE, (ENSURE ENLIVE) LIQD Take 237 mLs by mouth 2 (two) times daily between meals. 12/18/17   Roxan Hockey, MD  nicotine (NICODERM CQ - DOSED IN MG/24 HOURS) 21 mg/24hr patch Place 1 patch (21 mg total) onto the skin daily. Patient not taking: Reported on 01/16/2018 12/18/17   Roxan Hockey, MD  oxyCODONE (OXY IR/ROXICODONE) 5 MG immediate release tablet Take 1 tablet (5 mg total) by mouth every 4 (four) hours as needed for moderate pain or severe pain. Patient not taking: Reported on 01/16/2018 12/18/17   Roxan Hockey, MD  paliperidone (INVEGA SUSTENNA) 156 MG/ML SUSP injection Inject 1 mL (156 mg total) into the muscle once. Patient not taking: Reported on 01/16/2018 02/20/16   Hildred Priest, MD    Family History Family History  Problem Relation Age of Onset  . Colon cancer Father 69  . Stomach cancer Neg Hx     Social History Social History   Tobacco Use  . Smoking status: Current Every Day Smoker    Packs/day: 0.25    Types: Cigarettes, Cigars  . Smokeless tobacco: Never Used  Substance Use Topics  . Alcohol use: No    Alcohol/week: 0.0 oz  . Drug use: No     Allergies   Patient has no known allergies.   Review of Systems Review of Systems  Skin: Positive for color change and wound.       Blackened skin distal toes bilaterally (chronic), new drainage from toes of R foot  All other systems reviewed and are negative.    Physical Exam Updated Vital Signs BP 105/67   Pulse (!) 102   Temp 98.2 F (36.8 C) (Oral)   Resp 16   Ht 6' (1.829 m)   Wt 59 kg (130 lb)   SpO2 99%   BMI 17.63 kg/m   Physical Exam  Constitutional: He is oriented to person, place, and time. He appears well-developed and well-nourished. No distress.   HENT:  Head: Normocephalic and atraumatic.  Eyes: EOM are normal.  Neck: Normal range of motion.  Cardiovascular: Normal rate, regular rhythm and intact distal pulses.  Pulmonary/Chest: Effort normal and breath sounds normal. No respiratory distress. He has no wheezes.  Abdominal: He exhibits no distension.  Musculoskeletal: Normal range of motion.  Full active ROM of toes. Pedal pulses intact bilaterally  Neurological: He is alert and oriented to person, place, and time.  Skin:  Blackening of skin of distal toes bilaterally.  Significant skin peeling of bilateral feet.  Purulent foul-smelling drainage from between all toes of the right foot. R foot is warm. No red streaking noted.  Psychiatric: He has a normal mood and affect.  Nursing note and vitals  reviewed.  R foot:      L foot:     ED Treatments / Results  Labs (all labs ordered are listed, but only abnormal results are displayed) Labs Reviewed  CBC WITH DIFFERENTIAL/PLATELET - Abnormal; Notable for the following components:      Result Value   WBC 11.5 (*)    Hemoglobin 12.3 (*)    Neutro Abs 8.5 (*)    Monocytes Absolute 1.1 (*)    All other components within normal limits  BASIC METABOLIC PANEL - Abnormal; Notable for the following components:   BUN 21 (*)    All other components within normal limits  AEROBIC CULTURE (SUPERFICIAL SPECIMEN)  I-STAT CG4 LACTIC ACID, ED    EKG None  Radiology Dg Foot Complete Right  Result Date: 01/16/2018 CLINICAL DATA:  Black toes.  Evaluate for osteomyelitis. EXAM: RIGHT FOOT COMPLETE - 3+ VIEW COMPARISON:  12/04/2017 FINDINGS: There is newly seen cortical erosion involving the tuft of the toe. No fracture or malalignment. No soft tissue gas or opaque foreign body. IMPRESSION: Early erosion of the great toe tuft that is new from 12/04/2017, presumed osteomyelitis. Electronically Signed   By: Monte Fantasia M.D.   On: 01/16/2018 18:34    Procedures Procedures  (including critical care time)  Medications Ordered in ED Medications  vancomycin (VANCOCIN) 1,500 mg in sodium chloride 0.9 % 500 mL IVPB (0 mg Intravenous Stopped 01/16/18 2308)  ceFEPIme (MAXIPIME) 2 g in sodium chloride 0.9 % 100 mL IVPB (0 g Intravenous Stopped 01/16/18 1952)     Initial Impression / Assessment and Plan / ED Course  I have reviewed the triage vital signs and the nursing notes.  Pertinent labs & imaging results that were available during my care of the patient were reviewed by me and considered in my medical decision making (see chart for details).     Patient presenting for evaluation of drainage from in between the toes of the right foot.  Physical exam concerning, as patient has blackening of distal toes with purulent foul-smelling drainage.  No sensation.  Foot is warmer than the other.  Pulses equal bilaterally.  Will obtain x-ray and wound culture for further evaluation.  X-ray concerning for osteo-.  Will start IV antibiotics, obtain labs, and consult with orthopedics.  Pt with leukocytosis. lactate reassuring. Doubt sepsis.   Discussed with Dr. Sharol Given from orthopedics.  Although patient does not appear septic at this time, concerned about his social situation and ability to follow-up.  I am concerned about spread of infection.  Recommends admission to hospitalist service for continued IV antibiotics, will consult tomorrow.  Recommends admission to [facility] for possible need for surgery tomorrow.  Case discussed with hospitalist, patient to be admitted.   Final Clinical Impressions(s) / ED Diagnoses   Final diagnoses:  Osteoarthritis of right foot, unspecified osteoarthritis type    ED Discharge Orders    None       Franchot Heidelberg, PA-C 01/16/18 2317    Franchot Heidelberg, PA-C 01/16/18 2317    Charlesetta Shanks, MD 01/16/18 2328

## 2018-01-16 NOTE — ED Triage Notes (Signed)
Pt reports that he has drainage from his right toes that he noticed on his toes. Hx frost bite on right toes

## 2018-01-16 NOTE — Progress Notes (Signed)
A consult was received from an ED physician for vancomycin and cefepime per pharmacy dosing.  The patient's profile has been reviewed for ht/wt/allergies/indication/available labs.   A one time order has been placed for vancomycin 1500 mg and cefepime 2 gm.    Further antibiotics/pharmacy consults should be ordered by admitting physician if indicated.                       Thank you,  Eudelia Bunch, Pharm.D. [phone] 01/16/2018 7:02 PM

## 2018-01-17 LAB — BASIC METABOLIC PANEL
Anion gap: 7 (ref 5–15)
BUN: 14 mg/dL (ref 6–20)
CO2: 26 mmol/L (ref 22–32)
Calcium: 9 mg/dL (ref 8.9–10.3)
Chloride: 105 mmol/L (ref 101–111)
Creatinine, Ser: 1.05 mg/dL (ref 0.61–1.24)
Glucose, Bld: 121 mg/dL — ABNORMAL HIGH (ref 65–99)
Potassium: 3.9 mmol/L (ref 3.5–5.1)
SODIUM: 138 mmol/L (ref 135–145)

## 2018-01-17 LAB — CBC
HEMATOCRIT: 37.5 % — AB (ref 39.0–52.0)
Hemoglobin: 11.9 g/dL — ABNORMAL LOW (ref 13.0–17.0)
MCH: 29.3 pg (ref 26.0–34.0)
MCHC: 31.7 g/dL (ref 30.0–36.0)
MCV: 92.4 fL (ref 78.0–100.0)
PLATELETS: 253 10*3/uL (ref 150–400)
RBC: 4.06 MIL/uL — AB (ref 4.22–5.81)
RDW: 14 % (ref 11.5–15.5)
WBC: 7.4 10*3/uL (ref 4.0–10.5)

## 2018-01-17 MED ORDER — PALIPERIDONE ER 6 MG PO TB24
12.0000 mg | ORAL_TABLET | Freq: Every day | ORAL | Status: DC
  Administered 2018-01-20: 12 mg via ORAL
  Filled 2018-01-17 (×4): qty 2

## 2018-01-17 MED ORDER — BUDESONIDE 3 MG PO CPEP
9.0000 mg | ORAL_CAPSULE | Freq: Every day | ORAL | Status: DC
  Administered 2018-01-17 – 2018-01-21 (×4): 9 mg via ORAL
  Filled 2018-01-17 (×5): qty 3

## 2018-01-17 MED ORDER — CHLORPROMAZINE HCL 25 MG PO TABS
25.0000 mg | ORAL_TABLET | Freq: Three times a day (TID) | ORAL | Status: DC | PRN
  Filled 2018-01-17: qty 1

## 2018-01-17 MED ORDER — BENZTROPINE MESYLATE 0.5 MG PO TABS
0.5000 mg | ORAL_TABLET | Freq: Every evening | ORAL | Status: DC
  Administered 2018-01-20: 0.5 mg via ORAL
  Filled 2018-01-17 (×3): qty 1

## 2018-01-17 MED ORDER — ONDANSETRON HCL 4 MG/2ML IJ SOLN: 4.0000 mg | Freq: Four times a day (QID) | INTRAMUSCULAR | Status: DC | PRN

## 2018-01-17 MED ORDER — LORAZEPAM 0.5 MG PO TABS: 0.5000 mg | ORAL_TABLET | ORAL | Status: DC | PRN

## 2018-01-17 MED ORDER — LOPERAMIDE HCL 2 MG PO CAPS
2.0000 mg | ORAL_CAPSULE | Freq: Three times a day (TID) | ORAL | Status: DC | PRN
  Administered 2018-01-19 – 2018-01-21 (×2): 2 mg via ORAL
  Filled 2018-01-17 (×2): qty 1

## 2018-01-17 MED ORDER — TRAZODONE HCL 50 MG PO TABS: 50.0000 mg | ORAL_TABLET | Freq: Every evening | ORAL | Status: DC | PRN

## 2018-01-17 MED ORDER — SODIUM CHLORIDE 0.9 % IV SOLN
INTRAVENOUS | Status: AC
  Administered 2018-01-17: 01:00:00 via INTRAVENOUS

## 2018-01-17 MED ORDER — IPRATROPIUM-ALBUTEROL 0.5-2.5 (3) MG/3ML IN SOLN: 3.0000 mL | RESPIRATORY_TRACT | Status: DC | PRN

## 2018-01-17 MED ORDER — ATENOLOL 12.5 MG HALF TABLET
12.5000 mg | ORAL_TABLET | Freq: Every day | ORAL | Status: DC
  Administered 2018-01-17 – 2018-01-21 (×4): 12.5 mg via ORAL
  Filled 2018-01-17 (×5): qty 1

## 2018-01-17 MED ORDER — MESALAMINE 4 G RE ENEM
4.0000 g | ENEMA | Freq: Every day | RECTAL | Status: DC
  Filled 2018-01-17 (×4): qty 60

## 2018-01-17 MED ORDER — ONDANSETRON HCL 4 MG PO TABS: 4.0000 mg | ORAL_TABLET | Freq: Four times a day (QID) | ORAL | Status: DC | PRN

## 2018-01-17 MED ORDER — ENSURE ENLIVE PO LIQD
237.0000 mL | Freq: Two times a day (BID) | ORAL | Status: DC
  Administered 2018-01-17 – 2018-01-21 (×6): 237 mL via ORAL
  Filled 2018-01-17 (×2): qty 237

## 2018-01-17 MED ORDER — SODIUM BICARBONATE 650 MG PO TABS
650.0000 mg | ORAL_TABLET | Freq: Every day | ORAL | Status: DC
  Administered 2018-01-17 – 2018-01-21 (×4): 650 mg via ORAL
  Filled 2018-01-17 (×4): qty 1

## 2018-01-17 NOTE — ED Notes (Signed)
ED TO INPATIENT HANDOFF REPORT  Name/Age/Gender [name] 42 y.o. male  Code Status    Code Status Orders  (From admission, onward)        Start     Ordered   01/17/18 0826  Full code  Continuous     01/17/18 0825    Code Status History    Date Active Date Inactive Code Status Order ID Comments User Context   12/04/2017 2058 12/18/2017 2122 Full Code [id number]  Norval Morton, MD ED   02/11/2016 2056 02/16/2016 1609 Full Code [id number]  Clovis Fredrickson, MD Inpatient      Home/SNF/Other Self-care  Chief Complaint right foot has pussed from surgery  Level of Care/Admitting Diagnosis ED Disposition    ED Disposition Condition [place]: [place] [[id number]]  Level of Care: Med-Surg [16]  Diagnosis: Osteomyelitis [facility]) [[id number]]  Admitting Physician: Kara Pacer  Attending Physician: Bethena Roys Nessa.Cuff  Estimated length of stay: past midnight tomorrow  Certification:: I certify this patient will need inpatient services for at least 2 midnights  PT Class (Do Not Modify): Inpatient [101]  PT Acc Code (Do Not Modify): Private [1]       Medical History Past Medical History:  Diagnosis Date  . Anemia   . Anxiety   . Schizophrenia ([place])   . Ulcerative colitis ([place])     Allergies No Known Allergies  IV Location/Drains/Wounds Patient Lines/Drains/Airways Status   Active Line/Drains/Airways    Name:   Placement date:   Placement time:   Site:   Days:   Peripheral IV 01/16/18 Right Forearm   01/16/18    1914    Forearm   1          Labs/Imaging Results for orders placed or performed during the hospital encounter of 01/16/18 (from the past 48 hour(s))  Wound or Superficial Culture     Status: None (Preliminary result)   Collection Time: 01/16/18  6:36 PM  Result Value Ref Range   Specimen Description      FOOT RIGHT Performed at [facility], [place] [address], [place], [place] [address]    Special Requests      NONE Performed at [facility], [place] [address], [place], [place] [address]    Gram Stain      RARE WBC PRESENT, PREDOMINANTLY PMN MODERATE GRAM POSITIVE COCCI IN PAIRS RARE GRAM NEGATIVE RODS    Culture      CULTURE REINCUBATED FOR BETTER GROWTH Performed at [facility], [place] [address] [place], [place] [address]    Report Status PENDING   CBC with Differential     Status: Abnormal   Collection Time: 01/16/18  7:10 PM  Result Value Ref Range   WBC 11.5 (H) 4.0 - 10.5 K/uL   RBC 4.27 4.22 - 5.81 MIL/uL   Hemoglobin 12.3 (L) 13.0 - 17.0 g/dL   HCT 39.5 39.0 - 52.0 %   MCV 92.5 78.0 - 100.0 fL   MCH 28.8 26.0 - 34.0 pg   MCHC 31.1 30.0 - 36.0 g/dL   RDW 13.8 11.5 - 15.5 %   Platelets 268 150 - 400 K/uL   Neutrophils Relative % 73 %   Neutro Abs 8.5 (H) 1.7 - 7.7 K/uL   Lymphocytes Relative 15 %   Lymphs Abs 1.7 0.7 - 4.0 K/uL   Monocytes Relative 10 %   Monocytes Absolute 1.1 (H)  0.1 - 1.0 K/uL   Eosinophils Relative 2 %   Eosinophils Absolute 0.2 0.0 - 0.7 K/uL   Basophils Relative 0 %   Basophils Absolute 0.0 0.0 - 0.1 K/uL    Comment: Performed at [facility], [place] [address], [place], [place] [address]  Basic metabolic panel     Status: Abnormal   Collection Time: 01/16/18  7:10 PM  Result Value Ref Range   Sodium 138 135 - 145 mmol/L   Potassium 4.2 3.5 - 5.1 mmol/L   Chloride 103 101 - 111 mmol/L   CO2 26 22 - 32 mmol/L   Glucose, Bld 91 65 - 99 mg/dL   BUN 21 (H) 6 - 20 mg/dL   Creatinine, Ser 0.96 0.61 - 1.24 mg/dL   Calcium 9.1 8.9 - 10.3 mg/dL   GFR calc non Af Amer >60 >60 mL/min   GFR calc Af Amer >60 >60 mL/min    Comment: (NOTE) The eGFR has been calculated using the CKD EPI equation. This calculation has not been validated in all clinical situations. eGFR's persistently <60 mL/min signify possible Chronic Kidney Disease.    Anion gap 9 5 - 15     Comment: Performed at [facility], [place] [address], [place], [place] [address]  I-Stat CG4 Lactic Acid, ED     Status: None   Collection Time: 01/16/18  7:16 PM  Result Value Ref Range   Lactic Acid, Venous 0.62 0.5 - 1.9 mmol/L  Basic metabolic panel     Status: Abnormal   Collection Time: 01/17/18  9:00 AM  Result Value Ref Range   Sodium 138 135 - 145 mmol/L   Potassium 3.9 3.5 - 5.1 mmol/L   Chloride 105 101 - 111 mmol/L   CO2 26 22 - 32 mmol/L   Glucose, Bld 121 (H) 65 - 99 mg/dL   BUN 14 6 - 20 mg/dL   Creatinine, Ser 1.05 0.61 - 1.24 mg/dL   Calcium 9.0 8.9 - 10.3 mg/dL   GFR calc non Af Amer >60 >60 mL/min   GFR calc Af Amer >60 >60 mL/min    Comment: (NOTE) The eGFR has been calculated using the CKD EPI equation. This calculation has not been validated in all clinical situations. eGFR's persistently <60 mL/min signify possible Chronic Kidney Disease.    Anion gap 7 5 - 15    Comment: Performed at [facility], [place] [address] [place], [place] [address]  CBC     Status: Abnormal   Collection Time: 01/17/18  9:00 AM  Result Value Ref Range   WBC 7.4 4.0 - 10.5 K/uL   RBC 4.06 (L) 4.22 - 5.81 MIL/uL   Hemoglobin 11.9 (L) 13.0 - 17.0 g/dL   HCT 37.5 (L) 39.0 - 52.0 %   MCV 92.4 78.0 - 100.0 fL   MCH 29.3 26.0 - 34.0 pg   MCHC 31.7 30.0 - 36.0 g/dL   RDW 14.0 11.5 - 15.5 %   Platelets 253 150 - 400 K/uL    Comment: Performed at [facility], [place] [address] [place], [place] [address]   Dg Foot Complete Right  Result Date: 01/16/2018 CLINICAL DATA:  Black toes.  Evaluate for osteomyelitis. EXAM: RIGHT FOOT COMPLETE - 3+ VIEW COMPARISON:  12/04/2017 FINDINGS: There is newly seen cortical erosion involving the tuft of the toe. No fracture or malalignment. No soft tissue gas or opaque foreign body. IMPRESSION: Early erosion of the great toe tuft  that is new from 12/04/2017, presumed osteomyelitis. Electronically Signed    By: Monte Fantasia M.D.   On: 01/16/2018 18:34    Pending Labs Unresulted Labs (From admission, onward)   Start     Ordered   01/18/18 0500  Creatinine, serum  Every 48 hours,   R     01/17/18 1129   01/16/18 2358  Culture, blood (routine x 2)  BLOOD CULTURE X 2,   R     01/16/18 2357      Vitals/Pain Today's Vitals   01/17/18 0700 01/17/18 0858 01/17/18 1130 01/17/18 1221  BP: 91/60 133/83  115/76  Pulse: 80 86  81  Resp: _0 Temp:      TempSrc:      SpO2: 98% 99%  99%  Weight:      Height:      PainSc:   Asleep     Isolation Precautions No active isolations  Medications Medications  atenolol (TENORMIN) tablet 12.5 mg (12.5 mg Oral Given 01/17/18 0922)  benztropine (COGENTIN) tablet 0.5 mg (has no administration in time range)  budesonide (ENTOCORT EC) 24 hr capsule 9 mg (9 mg Oral Given 01/17/18 0922)  chlorproMAZINE (THORAZINE) tablet 25 mg (has no administration in time range)  feeding supplement (ENSURE ENLIVE) (ENSURE ENLIVE) liquid 237 mL (237 mLs Oral Given 01/17/18 0921)  ipratropium-albuterol (DUONEB) 0.5-2.5 (3) MG/3ML nebulizer solution 3 mL (has no administration in time range)  loperamide (IMODIUM) capsule 2 mg (has no administration in time range)  LORazepam (ATIVAN) tablet 0.5 mg (has no administration in time range)  mesalamine (ROWASA) enema 4 g (has no administration in time range)  paliperidone (INVEGA) 24 hr tablet 12 mg (has no administration in time range)  sodium bicarbonate tablet 650 mg (650 mg Oral Given 01/17/18 0921)  traZODone (DESYREL) tablet 50 mg (has no administration in time range)  ondansetron (ZOFRAN) tablet 4 mg (has no administration in time range)    Or  ondansetron (ZOFRAN) injection 4 mg (has no administration in time range)  piperacillin-tazobactam (ZOSYN) IVPB 3.375 g (3.375 g Intravenous New Bag/Given 01/17/18 1350)  vancomycin (VANCOCIN) 1,250 mg in sodium chloride 0.9 % 250 mL IVPB (has no administration in time  range)  0.9 %  sodium chloride infusion ( Intravenous Stopped 01/17/18 0851)  vancomycin (VANCOCIN) 1,500 mg in sodium chloride 0.9 % 500 mL IVPB (0 mg Intravenous Stopped 01/16/18 2308)  ceFEPIme (MAXIPIME) 2 g in sodium chloride 0.9 % 100 mL IVPB (0 g Intravenous Stopped 01/16/18 1952)    Mobility Ambulatory

## 2018-01-17 NOTE — ED Notes (Signed)
[facility] called for transport. 

## 2018-01-17 NOTE — ED Notes (Signed)
Attempted to call report to [facility]- charge RN unable to take report at this time.

## 2018-01-17 NOTE — Progress Notes (Signed)
PROGRESS NOTE    Christian Lamb  YKZ:[id number] DOB: 02/04/1976 DOA: 01/16/2018 PCP: [name]   Brief Narrative:  42 year old with past medical history relevant for schizoaffective disorder, ulcerative colitis status post ileal pouch and anal anastomosis with endoscopic evaluation in June 2019 suggesting possible Crohn's disease, COPD, hypertension, chronic tachycardia coming in with necrotic toes bilaterally secondary to frostbite right greater than left with x-ray evidence of osteomyelitis.   Assessment & Plan:   Principal Problem:   Osteomyelitis ([place]) Active Problems:   Schizoaffective disorder, bipolar type (HCC)   Tobacco use disorder   Chronic diarrhea   Ulcerative colitis with complication (HCC)   Necrotic toes (HCC)   #) Necrotic toes, complicated by osteomyelitis on right: X-ray shows early osteomyelitis on right.  Patient apparently was supposed to have outpatient follow-up with orthopedics for this but did not make the appointment. 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 Electrolytes: Monitor and supplement Nutrition: Regular diet  Prophylaxis: Enoxaparin  Disposition: Transferred to [facility] for orthopedic evaluation  Full code   Consultants:   Orthopedic surgery  Procedures: (Don't include imaging studies  which can be auto populated. Include things that cannot be auto populated i.e. Echo, Carotid and venous dopplers, Foley, Bipap, HD, tubes/drains, wound vac, central lines etc)  None  Antimicrobials: (specify start and planned stop date. Auto populated tables are space occupying and do not give end dates)  IV cefepime and vancomycin started 01/16/2018   Subjective: Patient reports that he does not have any complaints.  He is concerned about this upcoming surgery to have his toes removed.  It does not appear that he quite understands how severe the situation is.  He otherwise denies any nausea, vomiting, diarrhea.  Objective: Vitals:   01/17/18 0536 01/17/18 0700 01/17/18 0858 01/17/18 1221  BP: 98/69 91/60 133/83 115/76  Pulse: 86 80 86 81  Resp: 16 16 18 18   Temp:      TempSrc:      SpO2: 99% 98% 99% 99%  Weight:      Height:        Intake/Output Summary (Last 24 hours) at 01/17/2018 1341 Last data filed at 01/17/2018 0851 Gross per 24 hour  Intake 1425 ml  Output -  Net 1425 ml   Filed Weights   01/16/18 1631  Weight: 59 kg (130 lb)    Examination:  General exam: Appears calm and comfortable  Respiratory system: Clear to auscultation. Respiratory effort normal. Cardiovascular system: Tachycardic, regular rate and rhythm, no murmurs Gastrointestinal system: Soft, nondistended, plus bowel sounds, numerous well-healed incisions over abdomen Central nervous system: Alert and oriented. No focal neurological deficits. Extremities: No lower extremity edema, 2+ DP and PT pulses. Skin: Necrotic toes right greater than left, no sensation Psychiatry: Judgement and insight appear poor. Mood & affect flat    Data Reviewed: I have personally reviewed following labs and imaging studies  CBC: Recent Labs  Lab 01/16/18 1910 01/17/18 0900  WBC 11.5* 7.4  NEUTROABS 8.5*  --   HGB 12.3* 11.9*  HCT 39.5 37.5*  MCV 92.5 92.4  PLT 268 496   Basic Metabolic Panel: Recent Labs    Lab 01/16/18 1910 01/17/18 0900  NA 138 138  K 4.2 3.9  CL 103 105  CO2 26 26  GLUCOSE 91 121*  BUN 21* 14  CREATININE 0.96 1.05  CALCIUM 9.1 9.0   GFR: Estimated Creatinine Clearance: 76.5 mL/min (by C-G formula based on SCr of 1.05 mg/dL). Liver Function Tests: No results for input(s): AST, ALT, ALKPHOS, BILITOT, PROT, ALBUMIN in the last 168 hours. No results for input(s): LIPASE, AMYLASE in the last 168 hours. No results for input(s): AMMONIA in the last 168 hours. Coagulation Profile: No results for input(s): INR, PROTIME in the last 168 hours. Cardiac Enzymes: No results for input(s): CKTOTAL, CKMB, CKMBINDEX, TROPONINI in the last 168 hours. BNP (last 3 results) No results for input(s): PROBNP in the last 8760 hours. HbA1C: No results for input(s): HGBA1C in the last 72 hours. CBG: No results for input(s): GLUCAP in the last 168 hours. Lipid Profile: No results for input(s): CHOL, HDL, LDLCALC, TRIG, CHOLHDL, LDLDIRECT in the last 72 hours. Thyroid Function Tests: No results for input(s): TSH, T4TOTAL, FREET4, T3FREE, THYROIDAB in the last 72 hours. Anemia Panel: No results for input(s): VITAMINB12, FOLATE, FERRITIN, TIBC, IRON, RETICCTPCT in the last 72 hours. Sepsis Labs: Recent Labs  Lab 01/16/18 1916  LATICACIDVEN 0.62    Recent Results (from the past 240 hour(s))  Wound or Superficial Culture     Status: None (Preliminary result)   Collection Time: 01/16/18  6:36 PM  Result Value Ref Range Status   Specimen Description   Final    FOOT RIGHT Performed at [place] [address], [place], [place] [address]    Special Requests   Final    NONE Performed at [facility], [place] [address] [place], [place] [address]    Gram Stain   Final    RARE WBC PRESENT, PREDOMINANTLY PMN MODERATE GRAM POSITIVE COCCI IN PAIRS RARE GRAM NEGATIVE RODS    Culture   Final    CULTURE REINCUBATED FOR BETTER GROWTH Performed at  [facility], [place] [address] [place], [place] [address]    Report Status PENDING  Incomplete         Radiology Studies: Dg Foot Complete Right  Result Date: 01/16/2018 CLINICAL DATA:  Black toes.  Evaluate for osteomyelitis. EXAM: RIGHT FOOT COMPLETE - 3+ VIEW COMPARISON:  12/04/2017 FINDINGS: There is newly seen cortical erosion involving the tuft of the toe. No fracture or malalignment. No soft tissue gas or opaque foreign body. IMPRESSION: Early erosion of the great toe tuft that is new from 12/04/2017, presumed osteomyelitis. Electronically Signed   By: Monte Fantasia M.D.   On: 01/16/2018 18:34        Scheduled Meds: . atenolol  12.5 mg Oral Daily  . benztropine  0.5 mg Oral Nightly  . budesonide  9 mg Oral Daily  . feeding supplement (ENSURE ENLIVE)  237 mL Oral BID BM  . mesalamine  4 g Rectal QHS  . paliperidone  12 mg Oral QHS  . sodium bicarbonate  650 mg Oral Daily   Continuous Infusions: . piperacillin-tazobactam (ZOSYN)  IV Stopped (01/17/18 0901)  . vancomycin       LOS: 1 day    Time spent: [place],  MD [facility]  If 7PM-7AM, please contact night-coverage [email or web address] Password [email or web address] 01/17/2018, 1:41 PM

## 2018-01-18 ENCOUNTER — Encounter (HOSPITAL_COMMUNITY)
Admission: EM | Disposition: A | Discharge status: Skilled Nursing Facility | Payer: Self-pay | Source: Home / Self Care | Attending: Family Medicine

## 2018-01-18 ENCOUNTER — Inpatient Hospital Stay (HOSPITAL_COMMUNITY): Payer: Medicaid Other | Admitting: Anesthesiology

## 2018-01-18 ENCOUNTER — Encounter (HOSPITAL_COMMUNITY): Payer: Self-pay | Admitting: Surgery

## 2018-01-18 DIAGNOSIS — M86271 Subacute osteomyelitis, right ankle and foot: Secondary | ICD-10-CM

## 2018-01-18 DIAGNOSIS — I96 Gangrene, not elsewhere classified: Principal | ICD-10-CM

## 2018-01-18 DIAGNOSIS — M8600 Acute hematogenous osteomyelitis, unspecified site: Secondary | ICD-10-CM

## 2018-01-18 HISTORY — PX: AMPUTATION: SHX166

## 2018-01-18 LAB — CREATININE, SERUM
Creatinine, Ser: 0.9 mg/dL (ref 0.61–1.24)
GFR calc Af Amer: >60 mL/min (ref >60)

## 2018-01-18 SURGERY — AMPUTATION DIGIT
Anesthesia: General | Site: Leg Lower | Laterality: Right

## 2018-01-18 MED ORDER — CHLORHEXIDINE GLUCONATE 4 % EX LIQD
60.0000 mL | Freq: Once | CUTANEOUS | Status: DC
  Administered 2018-01-18: 4 via TOPICAL

## 2018-01-18 MED ORDER — MIDAZOLAM HCL 2 MG/2ML IJ SOLN
INTRAMUSCULAR | Status: AC
  Filled 2018-01-18: qty 2

## 2018-01-18 MED ORDER — ONDANSETRON HCL 4 MG/2ML IJ SOLN: 4.0000 mg | Freq: Four times a day (QID) | INTRAMUSCULAR | Status: DC | PRN

## 2018-01-18 MED ORDER — ONDANSETRON HCL 4 MG/2ML IJ SOLN
INTRAMUSCULAR | Status: DC | PRN
  Administered 2018-01-18: 4 mg via INTRAVENOUS

## 2018-01-18 MED ORDER — METHOCARBAMOL 500 MG PO TABS
500.0000 mg | ORAL_TABLET | Freq: Four times a day (QID) | ORAL | Status: DC | PRN
  Administered 2018-01-19: 500 mg via ORAL
  Filled 2018-01-18: qty 1

## 2018-01-18 MED ORDER — MAGNESIUM CITRATE PO SOLN: 1.0000 | Freq: Once | ORAL | Status: DC | PRN

## 2018-01-18 MED ORDER — METOCLOPRAMIDE HCL 5 MG PO TABS: 5.0000 mg | ORAL_TABLET | Freq: Three times a day (TID) | ORAL | Status: DC | PRN

## 2018-01-18 MED ORDER — MIDAZOLAM HCL 5 MG/5ML IJ SOLN
INTRAMUSCULAR | Status: DC | PRN
  Administered 2018-01-18: 2 mg via INTRAVENOUS

## 2018-01-18 MED ORDER — ONDANSETRON HCL 4 MG PO TABS: 4.0000 mg | ORAL_TABLET | Freq: Four times a day (QID) | ORAL | Status: DC | PRN

## 2018-01-18 MED ORDER — 0.9 % SODIUM CHLORIDE (POUR BTL) OPTIME
TOPICAL | Status: DC | PRN
  Administered 2018-01-18: 1000 mL

## 2018-01-18 MED ORDER — BISACODYL 10 MG RE SUPP
10.0000 mg | Freq: Every day | RECTAL | Status: DC | PRN
Start: 2018-01-18 — End: 2018-01-21

## 2018-01-18 MED ORDER — PHENYLEPHRINE HCL 10 MG/ML IJ SOLN
INTRAMUSCULAR | Status: DC | PRN
  Administered 2018-01-18: 80 ug via INTRAVENOUS
  Administered 2018-01-18: 120 ug via INTRAVENOUS

## 2018-01-18 MED ORDER — HYDROMORPHONE HCL 2 MG/ML IJ SOLN
0.2500 mg | INTRAMUSCULAR | Status: DC | PRN
  Administered 2018-01-18 (×2): 0.5 mg via INTRAVENOUS

## 2018-01-18 MED ORDER — LACTATED RINGERS IV SOLN
INTRAVENOUS | Status: DC
  Administered 2018-01-18 (×2): via INTRAVENOUS

## 2018-01-18 MED ORDER — PROMETHAZINE HCL 25 MG/ML IJ SOLN: 6.2500 mg | INTRAMUSCULAR | Status: DC | PRN

## 2018-01-18 MED ORDER — PROPOFOL 10 MG/ML IV BOLUS
INTRAVENOUS | Status: DC | PRN
  Administered 2018-01-18: 150 mg via INTRAVENOUS

## 2018-01-18 MED ORDER — LIDOCAINE HCL (CARDIAC) 20 MG/ML IV SOLN
INTRAVENOUS | Status: DC | PRN
  Administered 2018-01-18: 60 mg via INTRAVENOUS

## 2018-01-18 MED ORDER — METOCLOPRAMIDE HCL 5 MG/ML IJ SOLN: 5.0000 mg | Freq: Three times a day (TID) | INTRAMUSCULAR | Status: DC | PRN

## 2018-01-18 MED ORDER — FENTANYL CITRATE (PF) 250 MCG/5ML IJ SOLN
INTRAMUSCULAR | Status: AC
  Filled 2018-01-18: qty 5

## 2018-01-18 MED ORDER — DEXTROSE 5 % IV SOLN
500.0000 mg | Freq: Four times a day (QID) | INTRAVENOUS | Status: DC | PRN
  Filled 2018-01-18: qty 5

## 2018-01-18 MED ORDER — SODIUM CHLORIDE 0.9 % IV SOLN: INTRAVENOUS | Status: DC

## 2018-01-18 MED ORDER — FENTANYL CITRATE (PF) 100 MCG/2ML IJ SOLN
INTRAMUSCULAR | Status: DC | PRN
  Administered 2018-01-18: 100 ug via INTRAVENOUS

## 2018-01-18 MED ORDER — DOCUSATE SODIUM 100 MG PO CAPS
100.0000 mg | ORAL_CAPSULE | Freq: Two times a day (BID) | ORAL | Status: DC
  Administered 2018-01-19 – 2018-01-20 (×3): 100 mg via ORAL
  Filled 2018-01-18 (×4): qty 1

## 2018-01-18 MED ORDER — MEPERIDINE HCL 50 MG/ML IJ SOLN: 6.2500 mg | INTRAMUSCULAR | Status: DC | PRN

## 2018-01-18 MED ORDER — CEFAZOLIN SODIUM-DEXTROSE 2-4 GM/100ML-% IV SOLN: 2.0000 g | INTRAVENOUS | Status: DC

## 2018-01-18 MED ORDER — POVIDONE-IODINE 10 % EX SWAB: 2.0000 "application " | Freq: Once | CUTANEOUS | Status: DC

## 2018-01-18 MED ORDER — HYDROMORPHONE HCL 2 MG/ML IJ SOLN
INTRAMUSCULAR | Status: AC
  Administered 2018-01-18: 0.5 mg via INTRAVENOUS
  Filled 2018-01-18: qty 1

## 2018-01-18 MED ORDER — POLYETHYLENE GLYCOL 3350 17 G PO PACK: 17.0000 g | PACK | Freq: Every day | ORAL | Status: DC | PRN

## 2018-01-18 MED ORDER — DEXAMETHASONE SODIUM PHOSPHATE 10 MG/ML IJ SOLN
INTRAMUSCULAR | Status: DC | PRN
  Administered 2018-01-18: 10 mg via INTRAVENOUS

## 2018-01-18 MED ORDER — PROPOFOL 10 MG/ML IV BOLUS
INTRAVENOUS | Status: AC
  Filled 2018-01-18: qty 20

## 2018-01-18 SURGICAL SUPPLY — 30 items
BLADE SURG 21 STRL SS (BLADE) ×3 IMPLANT
BNDG COHESIVE 4X5 TAN STRL (GAUZE/BANDAGES/DRESSINGS) ×3 IMPLANT
BNDG ESMARK 4X9 LF (GAUZE/BANDAGES/DRESSINGS) IMPLANT
BNDG GAUZE ELAST 4 BULKY (GAUZE/BANDAGES/DRESSINGS) ×3 IMPLANT
CANISTER WOUNDNEG PRESSURE 500 (CANNISTER) ×3 IMPLANT
COVER SURGICAL LIGHT HANDLE (MISCELLANEOUS) ×6 IMPLANT
DRAPE U-SHAPE 47X51 STRL (DRAPES) ×3 IMPLANT
DRESSING PREVENA PLUS CUSTOM (GAUZE/BANDAGES/DRESSINGS) ×1 IMPLANT
DRSG ADAPTIC 3X8 NADH LF (GAUZE/BANDAGES/DRESSINGS) IMPLANT
DRSG PAD ABDOMINAL 8X10 ST (GAUZE/BANDAGES/DRESSINGS) ×3 IMPLANT
DRSG PREVENA PLUS CUSTOM (GAUZE/BANDAGES/DRESSINGS) ×3
DURAPREP 26ML APPLICATOR (WOUND CARE) ×3 IMPLANT
ELECT REM PT RETURN 9FT ADLT (ELECTROSURGICAL) ×3
ELECTRODE REM PT RTRN 9FT ADLT (ELECTROSURGICAL) ×1 IMPLANT
GAUZE SPONGE 4X4 12PLY STRL (GAUZE/BANDAGES/DRESSINGS) IMPLANT
GLOVE BIOGEL PI IND STRL 9 (GLOVE) ×1 IMPLANT
GLOVE BIOGEL PI INDICATOR 9 (GLOVE) ×2
GLOVE SURG ORTHO 9.0 STRL STRW (GLOVE) ×3 IMPLANT
GOWN STRL REUS W/ TWL XL LVL3 (GOWN DISPOSABLE) ×2 IMPLANT
GOWN STRL REUS W/TWL XL LVL3 (GOWN DISPOSABLE) ×4
KIT BASIN OR (CUSTOM PROCEDURE TRAY) ×3 IMPLANT
KIT TURNOVER KIT B (KITS) ×3 IMPLANT
MANIFOLD NEPTUNE II (INSTRUMENTS) ×3 IMPLANT
NEEDLE 22X1 1/2 (OR ONLY) (NEEDLE) IMPLANT
NS IRRIG 1000ML POUR BTL (IV SOLUTION) ×3 IMPLANT
PACK ORTHO EXTREMITY (CUSTOM PROCEDURE TRAY) ×3 IMPLANT
PAD ARMBOARD 7.5X6 YLW CONV (MISCELLANEOUS) ×6 IMPLANT
SUT ETHILON 2 0 PSLX (SUTURE) ×6 IMPLANT
SYR CONTROL 10ML LL (SYRINGE) IMPLANT
TOWEL OR 17X26 10 PK STRL BLUE (TOWEL DISPOSABLE) ×3 IMPLANT

## 2018-01-18 NOTE — Transfer of Care (Signed)
Immediate Anesthesia Transfer of Care Note  Patient: Christian Lamb  Procedure(s) Performed: AMPUTATION OF TOES (Right Leg Lower)  Patient Location: PACU  Anesthesia Type:General  Level of Consciousness: drowsy  Airway & Oxygen Therapy: Patient Spontanous Breathing and Patient connected to face mask oxygen  Post-op Assessment: Report given to RN and Post -op Vital signs reviewed and stable  Post vital signs: Reviewed and stable  Last Vitals:  Vitals Value Taken Time  BP    Temp    Pulse    Resp    SpO2      Last Pain:  Vitals:   01/18/18 1008  TempSrc: Oral  PainSc:          Complications: No apparent anesthesia complications

## 2018-01-18 NOTE — Progress Notes (Signed)
Orthopedic Tech Progress Note Patient Details:  Christian Lamb 1976/02/03 [id number]  Ortho Devices Type of Ortho Device: Postop shoe/boot Ortho Device/Splint Location: (B) LE Ortho Device/Splint Interventions: Ordered, Application   Post Interventions Patient Tolerated: Well Instructions Provided: Care of device   Braulio Bosch 01/18/2018, 5:54 PM

## 2018-01-18 NOTE — Anesthesia Postprocedure Evaluation (Signed)
Anesthesia Post Note  Patient: Christian Lamb  Procedure(s) Performed: AMPUTATION OF TOES (Right Leg Lower)     Patient location during evaluation: PACU Anesthesia Type: General Level of consciousness: sedated and patient cooperative Pain management: pain level controlled Vital Signs Assessment: post-procedure vital signs reviewed and stable Respiratory status: spontaneous breathing Cardiovascular status: stable Anesthetic complications: no    Last Vitals:  Vitals:   01/18/18 1600 01/18/18 1615  BP: 111/82 113/86  Pulse: 73 67  Resp: 16 16  Temp: (!) 36.3 C 36.4 C  SpO2: 100% 100%    Last Pain:  Vitals:   01/18/18 1615  TempSrc: Oral  PainSc:                  Nolon Nations

## 2018-01-18 NOTE — Progress Notes (Signed)
PROGRESS NOTE    Christian Lamb  [id number]:[id number] DOB: 1976/09/08 DOA: 01/16/2018 PCP: [name]   Brief Narrative:  42 year old with past medical history relevant for schizoaffective disorder, ulcerative colitis status post ileal pouch and anal anastomosis with endoscopic evaluation in June 2019 suggesting possible Crohn's disease, COPD, hypertension, chronic tachycardia coming in with necrotic toes bilaterally secondary to frostbite right greater than left with x-ray evidence of osteomyelitis.  Assessment & Plan:   Principal Problem:   Osteomyelitis ([place]) Active Problems:   Schizoaffective disorder, bipolar type (HCC)   Tobacco use disorder   Chronic diarrhea   Ulcerative colitis with complication (HCC)   Necrotic toes (HCC)   #) Necrotic toes, complicated by osteomyelitis on right: X-ray shows early osteomyelitis on right.  Patient apparently was supposed to have outpatient follow-up with orthopedics for this but did not make the appointment. 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: -Continue budesonide 9 mg daily - Continue mesalamine 60 mL's rectally at bedtime  - continue Lomotil 4 times daily -Continue loperamide 2 mg 3 times daily  #) Schizoaffective disorder: -Continue paliperidone 12 mg nightly -Continue benztropine 0.5 mg nightly  #) Pain/psych: -Continue chlorpromazine 25 mg 3 times daily as needed for hiccups -Continue PRN oxycodone  Nutrition: Regular diet  Prophylaxis: Enoxaparin  Disposition: Transferred to [facility] for orthopedic evaluation  Full code   Consultants:   Orthopedic surgery   Procedures: per ortho  Antimicrobials: and ancef  IV cefepime and vancomycin  started 01/16/2018   Subjective: No new complaints reported to me today.  Objective: Vitals:   01/17/18 1221 01/17/18 1600 01/17/18 2004 01/18/18 1008  BP: 115/76 99/67 (!) 90/51 96/71  Pulse: 81 87 84 77  Resp: 18 18    Temp:  97.7 F (36.5 C) 98.3 F (36.8 C) 98 F (36.7 C)  TempSrc:  Oral Oral Oral  SpO2: 99% 100% 99% 100%  Weight:      Height:       No intake or output data in the 24 hours ending 01/18/18 1252 Filed Weights   01/16/18 1631  Weight: 59 kg (130 lb)    Examination:  General exam: Appears calm and comfortable, in nad. Respiratory system: Clear to auscultation. Respiratory effort normal. Equal chest rise.  Cardiovascular system: Tachycardic, regular rate and rhythm, no murmurs Gastrointestinal system: Soft, nondistended, plus bowel sounds, numerous well-healed incisions over abdomen Central nervous system: Alert and oriented. No focal neurological deficits. Extremities: No lower extremity edema, 2+ DP and PT pulses. Skin: Necrotic toes right greater than left, no sensation Psychiatry: Judgement and insight appear poor. Mood & affect flat  Data Reviewed: I have personally reviewed following labs and imaging studies  CBC: Recent Labs  Lab 01/16/18 1910 01/17/18 0900  WBC 11.5* 7.4  NEUTROABS 8.5*  --   HGB 12.3* 11.9*  HCT 39.5 37.5*  MCV 92.5 92.4  PLT 268 536   Basic Metabolic Panel: Recent Labs  Lab 01/16/18 1910 01/17/18 0900 01/18/18 0639  NA 138 138  --   K 4.2 3.9  --   CL 103 105  --   CO2 26 26  --   GLUCOSE 91 121*  --   BUN 21* 14  --   CREATININE 0.96 1.05 0.90  CALCIUM 9.1 9.0  --  GFR: Estimated Creatinine Clearance: 89.2 mL/min (by C-G formula based on SCr of 0.9 mg/dL). Liver Function Tests: No results for input(s): AST, ALT, ALKPHOS, BILITOT, PROT, ALBUMIN in the last 168 hours. No results for input(s): LIPASE, AMYLASE in the last 168 hours. No results for input(s): AMMONIA in the last 168 hours. Coagulation  Profile: No results for input(s): INR, PROTIME in the last 168 hours. Cardiac Enzymes: No results for input(s): CKTOTAL, CKMB, CKMBINDEX, TROPONINI in the last 168 hours. BNP (last 3 results) No results for input(s): PROBNP in the last 8760 hours. HbA1C: No results for input(s): HGBA1C in the last 72 hours. CBG: No results for input(s): GLUCAP in the last 168 hours. Lipid Profile: No results for input(s): CHOL, HDL, LDLCALC, TRIG, CHOLHDL, LDLDIRECT in the last 72 hours. Thyroid Function Tests: No results for input(s): TSH, T4TOTAL, FREET4, T3FREE, THYROIDAB in the last 72 hours. Anemia Panel: No results for input(s): VITAMINB12, FOLATE, FERRITIN, TIBC, IRON, RETICCTPCT in the last 72 hours. Sepsis Labs: Recent Labs  Lab 01/16/18 1916  LATICACIDVEN 0.62    Recent Results (from the past 240 hour(s))  Wound or Superficial Culture     Status: None (Preliminary result)   Collection Time: 01/16/18  6:36 PM  Result Value Ref Range Status   Specimen Description   Final    FOOT RIGHT Performed at [facility] [address], [place], [place] [address]    Special Requests   Final    NONE Performed at [facility] [place], [place] [address], [place], [place] [address]    Gram Stain   Final    RARE WBC PRESENT, PREDOMINANTLY PMN MODERATE GRAM POSITIVE COCCI IN PAIRS RARE GRAM NEGATIVE RODS    Culture   Final    CULTURE REINCUBATED FOR BETTER GROWTH Performed at [facility], [place] [address], [place], [place] [address]    Report Status PENDING  Incomplete    Radiology Studies: Dg Foot Complete Right  Result Date: 01/16/2018 CLINICAL DATA:  Black toes.  Evaluate for osteomyelitis. EXAM: RIGHT FOOT COMPLETE - 3+ VIEW COMPARISON:  12/04/2017 FINDINGS: There is newly seen cortical erosion involving the tuft of the toe. No fracture or malalignment. No soft tissue gas or opaque foreign body. IMPRESSION: Early erosion of the great toe tuft that is new  from 12/04/2017, presumed osteomyelitis. Electronically Signed   By: Monte Fantasia M.D.   On: 01/16/2018 18:34   Scheduled Meds: . [MAR Hold] atenolol  12.5 mg Oral Daily  . [MAR Hold] benztropine  0.5 mg Oral Nightly  . [MAR Hold] budesonide  9 mg Oral Daily  . chlorhexidine  60 mL Topical Once  . [MAR Hold] feeding supplement (ENSURE ENLIVE)  237 mL Oral BID BM  . [MAR Hold] mesalamine  4 g Rectal QHS  . [MAR Hold] paliperidone  12 mg Oral QHS  . povidone-iodine  2 application Topical Once  . [MAR Hold] sodium bicarbonate  650 mg Oral Daily   Continuous Infusions: .  ceFAZolin (ANCEF) IV    . lactated ringers 10 mL/hr at 01/18/18 1030  . [MAR Hold] piperacillin-tazobactam (ZOSYN)  IV 3.375 g (01/17/18 2200)  . [MAR Hold] vancomycin Stopped (01/18/18 0000)     LOS: 2 days    Time spent: [place], MD [facility]  If 7PM-7AM, please contact night-coverage [email or web address] Password [facility] 01/18/2018, 12:52 PM

## 2018-01-18 NOTE — Anesthesia Procedure Notes (Signed)
Procedure Name: LMA Insertion Date/Time: 01/18/2018 2:35 PM Performed by: Oletta Lamas, CRNA Pre-anesthesia Checklist: Patient identified, Emergency Drugs available, Suction available and Patient being monitored Patient Re-evaluated:Patient Re-evaluated prior to induction Oxygen Delivery Method: Circle System Utilized Preoxygenation: Pre-oxygenation with 100% oxygen Induction Type: IV induction Ventilation: Mask ventilation without difficulty LMA: LMA inserted LMA Size: 5.0 Number of attempts: 1 Placement Confirmation: positive ETCO2 Tube secured with: Tape Dental Injury: Teeth and Oropharynx as per pre-operative assessment

## 2018-01-18 NOTE — Consult Note (Signed)
ORTHOPAEDIC CONSULTATION  REQUESTING PHYSICIAN: Velvet Bathe, MD  Chief Complaint: Gangrene of all toes.  HPI: Christian Lamb is a 42 y.o. male who presents with gangrene of the forefoot secondary to frostbite.  Past Medical History:  Diagnosis Date  . Anemia   . Anxiety   . Schizophrenia ([place])   . Ulcerative colitis [facility])    Past Surgical History:  Procedure Laterality Date  . SMALL INTESTINE SURGERY  2016   in [place]  . TOTAL COLECTOMY  2003   Social History   Socioeconomic History  . Marital status: Single    Spouse name: Not on file  . Number of children: Not on file  . Years of education: Not on file  . Highest education level: Not on file  Occupational History  . Not on file  Social Needs  . Financial resource strain: Not on file  . Food insecurity:    Worry: Not on file    Inability: Not on file  . Transportation needs:    Medical: Not on file    Non-medical: Not on file  Tobacco Use  . Smoking status: Current Every Day Smoker    Packs/day: 0.25    Types: Cigarettes, Cigars  . Smokeless tobacco: Never Used  Substance and Sexual Activity  . Alcohol use: No    Alcohol/week: 0.0 oz  . Drug use: No  . Sexual activity: Not Currently  Lifestyle  . Physical activity:    Days per week: Not on file    Minutes per session: Not on file  . Stress: Not on file  Relationships  . Social connections:    Talks on phone: Not on file    Gets together: Not on file    Attends religious service: Not on file    Active member of club or organization: Not on file    Attends meetings of clubs or organizations: Not on file    Relationship status: Not on file  Other Topics Concern  . Not on file  Social History Narrative  . Not on file   Family History  Problem Relation Age of Onset  . Colon cancer Father 33  . Stomach cancer Neg Hx    - negative except otherwise stated in the family history section No Known Allergies Prior to Admission medications     Medication Sig Start Date End Date Taking? Authorizing Provider  atenolol (TENORMIN) 25 MG tablet Take 0.5 tablets (12.5 mg total) by mouth daily. 12/19/17  Yes Emokpae, Courage, MD  b complex vitamins tablet Take 1 tablet by mouth daily. 12/18/17  Yes Emokpae, Courage, MD  benztropine (COGENTIN) 0.5 MG tablet Take 1 tablet (0.5 mg total) by mouth Nightly. 12/18/17  Yes Emokpae, Courage, MD  budesonide (ENTOCORT EC) 3 MG 24 hr capsule Take 3 capsules (9 mg total) by mouth daily. 12/19/17  Yes Emokpae, Courage, MD  chlorproMAZINE (THORAZINE) 25 MG tablet Take 1 tablet (25 mg total) by mouth 3 (three) times daily as needed for hiccoughs. 12/18/17  Yes Emokpae, Courage, MD  diphenoxylate-atropine (LOMOTIL) 2.5-0.025 MG tablet Take 1 tablet by mouth 4 (four) times daily. 12/18/17  Yes Emokpae, Courage, MD  folic acid (FOLVITE) 1 MG tablet Take 1 tablet (1 mg total) by mouth daily. 12/19/17  Yes Emokpae, Courage, MD  Hydrocortisone (GERHARDT'S BUTT CREAM) CREA Apply 1 application topically 2 (two) times daily. 12/18/17  Yes Emokpae, Courage, MD  ipratropium-albuterol (DUONEB) 0.5-2.5 (3) MG/3ML SOLN Take 3 mLs by nebulization every 4 (four) hours  as needed. 12/18/17  Yes Emokpae, Courage, MD  loperamide (IMODIUM) 2 MG capsule Take 1 capsule (2 mg total) by mouth 3 (three) times daily. For diarrhea 12/18/17  Yes Emokpae, Courage, MD  LORazepam (ATIVAN) 0.5 MG tablet Take 1 tablet (0.5 mg total) by mouth every 4 (four) hours as needed for anxiety. 12/18/17  Yes Emokpae, Courage, MD  mesalamine (ROWASA) 4 g enema Place 60 mLs (4 g total) rectally at bedtime. 12/18/17  Yes Emokpae, Courage, MD  multivitamin (PROSIGHT) TABS tablet Take 1 tablet by mouth daily. 12/19/17  Yes Emokpae, Courage, MD  ondansetron (ZOFRAN) 4 MG tablet Take 1 tablet (4 mg total) by mouth every 6 (six) hours as needed for nausea. 12/18/17  Yes Emokpae, Courage, MD  paliperidone (INVEGA) 6 MG 24 hr tablet Take 2 tablets (12 mg total) by mouth at  bedtime. 12/18/17  Yes Emokpae, Courage, MD  sodium bicarbonate 650 MG tablet Take 1 tablet (650 mg total) by mouth daily. 12/19/17  Yes Roxan Hockey, MD  traZODone (DESYREL) 50 MG tablet Take 1 tablet (50 mg total) by mouth at bedtime as needed for sleep. 12/18/17  Yes Emokpae, Courage, MD  feeding supplement, ENSURE ENLIVE, (ENSURE ENLIVE) LIQD Take 237 mLs by mouth 2 (two) times daily between meals. 12/18/17   Roxan Hockey, MD  nicotine (NICODERM CQ - DOSED IN MG/24 HOURS) 21 mg/24hr patch Place 1 patch (21 mg total) onto the skin daily. Patient not taking: Reported on 01/16/2018 12/18/17   Roxan Hockey, MD  oxyCODONE (OXY IR/ROXICODONE) 5 MG immediate release tablet Take 1 tablet (5 mg total) by mouth every 4 (four) hours as needed for moderate pain or severe pain. Patient not taking: Reported on 01/16/2018 12/18/17   Roxan Hockey, MD  paliperidone (INVEGA SUSTENNA) 156 MG/ML SUSP injection Inject 1 mL (156 mg total) into the muscle once. Patient not taking: Reported on 01/16/2018 02/20/16   Hildred Priest, MD   Dg Foot Complete Right  Result Date: 01/16/2018 CLINICAL DATA:  Black toes.  Evaluate for osteomyelitis. EXAM: RIGHT FOOT COMPLETE - 3+ VIEW COMPARISON:  12/04/2017 FINDINGS: There is newly seen cortical erosion involving the tuft of the toe. No fracture or malalignment. No soft tissue gas or opaque foreign body. IMPRESSION: Early erosion of the great toe tuft that is new from 12/04/2017, presumed osteomyelitis. Electronically Signed   By: Monte Fantasia M.D.   On: 01/16/2018 18:34   - pertinent xrays, CT, MRI studies were reviewed and independently interpreted  Positive ROS: All other systems have been reviewed and were otherwise negative with the exception of those mentioned in the HPI and as above.  Physical Exam: General: Alert, no acute distress Psychiatric: Patient is competent for consent with normal mood and affect Lymphatic: No axillary or cervical  lymphadenopathy Cardiovascular: No pedal edema Respiratory: No cyanosis, no use of accessory musculature GI: No organomegaly, abdomen is soft and non-tender  Skin: Examination patient has partial thickness gangrene of the toes of the left foot.  Right foot he has full-thickness ulceration with exposed bone joint and tendon.  Images:  @ENCIMAGES @   Neurologic: Patient does not have protective sensation bilateral lower extremities.   MUSCULOSKELETAL:  Examination patient has a strong dorsalis pedis pulse bilaterally.  He has full thickness gangrene of the toes on the right foot.  There is partial thickness gangrene of the toes in the left foot with some epithelialization beneath the black eschar.  The only area of concern is the fifth metatarsal head on the  left foot with ulceration down to the joint capsule.  Patient has black eschar on both heels and the status of the heel pad is to be determined.  Assessment: Assessment: Partial thickness gangrene of the bilateral heels full-thickness gangrene of the toes in the right foot and partial thickness gangrene of the toes of the left foot with a full-thickness ulcer down to the fifth metatarsal head of the left foot.  Plan: Plan: I feel a minimum patient will require amputation through the MTP joint of the right foot amputated all 5 toes.  Patient most likely will require 1/5 ray amputation of the left foot but would proceed with this once patient's right foot is healed.  The status of the heel pad is unknown at this time and we will give this time to heal on its own.  Patient will need discharge to skilled nursing.  We will plan for surgery today.  Thank you for the consult and the opportunity to see Christian Lamb, [facility] [phone] 7:58 AM

## 2018-01-18 NOTE — Op Note (Signed)
01/18/2018  3:17 PM  PATIENT:  Christian Lamb    PRE-OPERATIVE DIAGNOSIS: Gangrene right forefoot  POST-OPERATIVE DIAGNOSIS:  Same  PROCEDURE: Transmetatarsal amputation right foot  SURGEON:  Newt Minion, MD  PHYSICIAN ASSISTANT:None ANESTHESIA:   General  PREOPERATIVE INDICATIONS:  Christian Lamb is a  42 y.o. male with a diagnosis of amputation of toes who failed conservative measures and elected for surgical management.    The risks benefits and alternatives were discussed with the patient preoperatively including but not limited to the risks of infection, bleeding, nerve injury, cardiopulmonary complications, the need for revision surgery, among others, and the patient was willing to proceed.  OPERATIVE IMPLANTS: None  @ENCIMAGES @  OPERATIVE FINDINGS: Good bleeding with good healthy tissue at the resection margins.  OPERATIVE PROCEDURE: Patient was brought to the operating room and underwent a general anesthetic.  After adequate levels of anesthesia were obtained patient's right lower extremity was prepped using DuraPrep draped into a sterile field a timeout was called.  A fishmouth incision was made just distal to the MTP joints.  The toes 134 and 5 were amputated through the MTP joint and the distal second metatarsal was resected to allow for a even cascade across the metatarsal heads with no bony prominence.  This was beveled plantarly and dorsally.  The wound was irrigated with normal saline electrocautery was used for hemostasis.  The incision was closed using 2-0 nylon.  A wound VAC was applied this had a good suction fit patient was extubated taken the PACU in stable condition.  Anticipate patient will need discharge to skilled nursing.  And patient will have the hospital wound VAC pump changed over to the [facility] portable pump.   DISCHARGE PLANNING:  Antibiotic duration: Continue antibiotics for 24 hours postoperatively  Weightbearing: Nonweightbearing on the  right  Pain medication: As ordered  Dressing care/ Wound VAC: Continue wound VAC for 1 week postoperatively to be discharged with the [employer] portable wound VAC pump  Ambulatory devices: Walker or crutches  Discharge to: Skilled nursing facility if possible.  Follow-up: In the office 1 week post operative.

## 2018-01-18 NOTE — Anesthesia Preprocedure Evaluation (Addendum)
Anesthesia Evaluation  Patient identified by MRN, date of birth, ID band Patient awake    Reviewed: Allergy & Precautions, NPO status , Patient's Chart, lab work & pertinent test results, reviewed documented beta blocker date and time   Airway Mallampati: II  TM Distance: >3 FB Neck ROM: Full    Dental no notable dental hx.    Pulmonary neg pulmonary ROS, Current Smoker,    Pulmonary exam normal breath sounds clear to auscultation       Cardiovascular Pt. on home beta blockers Normal cardiovascular exam Rhythm:Regular Rate:Normal     Neuro/Psych PSYCHIATRIC DISORDERS Anxiety Bipolar Disorder Schizophrenia negative neurological ROS  negative psych ROS   GI/Hepatic negative GI ROS, Neg liver ROS, PUD,   Endo/Other  negative endocrine ROS  Renal/GU Renal diseasenegative Renal ROS     Musculoskeletal negative musculoskeletal ROS (+)   Abdominal   Peds  Hematology negative hematology ROS (+) anemia ,   Anesthesia Other Findings   Reproductive/Obstetrics negative OB ROS                            Anesthesia Physical Anesthesia Plan  ASA: III  Anesthesia Plan: General   Post-op Pain Management:    Induction: Intravenous  PONV Risk Score and Plan: 2 and Ondansetron, Treatment may vary due to age or medical condition and Dexamethasone  Airway Management Planned: LMA  Additional Equipment:   Intra-op Plan:   Post-operative Plan: Extubation in OR  Informed Consent: I have reviewed the patients History and Physical, chart, labs and discussed the procedure including the risks, benefits and alternatives for the proposed anesthesia with the patient or authorized representative who has indicated his/her understanding and acceptance.   Dental advisory given  Plan Discussed with: CRNA  Anesthesia Plan Comments:         Anesthesia Quick Evaluation

## 2018-01-19 ENCOUNTER — Other Ambulatory Visit: Payer: Self-pay

## 2018-01-19 ENCOUNTER — Encounter (HOSPITAL_COMMUNITY): Payer: Self-pay | Admitting: Orthopedic Surgery

## 2018-01-19 NOTE — Progress Notes (Signed)
Pharmacy Antibiotic Note  Christian Lamb is a 42 y.o. male admitted on 01/16/2018 with drainage from right toes.  Pharmacy has been consulted for vancomycin and Zosyn dosing for osteomyelitis.  Patient is now s/p right toe amputation.  SCr stable, afebrile, WBC normalized.   Plan: Vanc 1241m IV Q24H Zosyn EID 3.375gm IV Q8H Monitor renal fxn, clinical progress, vanc trough soon if still on therapy   Height: 6' (182.9 cm) Weight: 130 lb (59 kg) IBW/kg (Calculated) : 77.6  Temp (24hrs), Avg:97.6 F (36.4 C), Min:97.3 F (36.3 C), Max:98 F (36.7 C)  Recent Labs  Lab 01/16/18 1910 01/16/18 1916 01/17/18 0900 01/18/18 0639  WBC 11.5*  --  7.4  --   CREATININE 0.96  --  1.05 0.90  LATICACIDVEN  --  0.62  --   --     Estimated Creatinine Clearance: 89.2 mL/min (by C-G formula based on SCr of 0.9 mg/dL).    No Known Allergies   Vanc 4/12 >> Zosyn 4/12 >>  4/12 right foot wound cx (superficial) - reincubated    [name] D. DMina Marble PharmD, BCPS Pager:  [phone]/15/2019, 8:52 AM

## 2018-01-19 NOTE — Plan of Care (Signed)

## 2018-01-19 NOTE — Evaluation (Signed)
Physical Therapy Evaluation Patient Details Name: Christian Lamb MRN: [id number] DOB: 17-Oct-1975 Today's Date: 01/19/2018   History of Present Illness  Pt is a 42 y.o. male s/p R toes amputation secondary to necrotic toes at time of intake. PMH includes anemia, schizoaffective disorder, and chronic tachycardia. Current R toes amputation resulted from frostbite of R foot. His previous housing situation is unclear, but pt reports desire to go home with his power of attorney, who pt reports is a close cousin.   Clinical Impression  Pt was able to transfer from the bed to the commode and from the commode to the chair with minimal assistance. We educated the pt on his WB status for both the RLE and LLE, and he followed his WB restrictions for all transfers. The pt was concerned about how well his wounds would heal and whether he would be able to walk again or have to use a WC for the rest of his life. Pt will benefit from skilled PT to increase safety with transfers, educate pt on DME use, decrease further skin breakdown, and prevent deleterious effects of immobility. PT to follow acutely.    Follow Up Recommendations SNF    Equipment Recommendations  Other (comment);Rolling walker with 5" wheels;Wheelchair (measurements PT);Wheelchair cushion (measurements PT);3in1 (PT)(Pending d/c placement)    Recommendations for Other Services   NA    Precautions / Restrictions Precautions Precautions: Other (comment)(See comments under "Other Position/Activity Restrictions".) Restrictions Weight Bearing Restrictions: Yes RLE Weight Bearing: Non weight bearing Other Position/Activity Restrictions: Pt is NWB on RLE but is also only supposed to bear weight through LLE for transfers to and from bed/chair/commode to prevent further damage to L foot wounds.      Mobility  Bed Mobility Overal bed mobility: Independent                Transfers Overall transfer level: Needs assistance Equipment  used: Rolling walker (2 wheeled) Transfers: Sit to/from [facility] Sit to Stand: Min assist;+2 safety/equipment(Min A at gait belt due to multiple attempts to stand) Stand pivot transfers: Min assist       General transfer comment: Min A for stand pivot transfer due to pt letting go of RW during pivot and sudden sitting/plop in the recliner without warning.       Balance Overall balance assessment: Needs assistance;Mild deficits observed, not formally tested Sitting-balance support: Bilateral upper extremity supported;Feet unsupported Sitting balance-Leahy Scale: Good Sitting balance - Comments: Static sitting balance WFL; dynamic balance not formally assessed Postural control: Posterior lean(posterior lean during sit to stand) Standing balance support: Bilateral upper extremity supported Standing balance-Leahy Scale: Poor Standing balance comment: Pt needed several attempts to stand from EOB and had a tendency to lean posteriorly, likely secondary to his attempts to maintain WB restrictions.                             Pertinent Vitals/Pain Pain Assessment: 0-10 Pain Score: 0-No pain    Home Living Family/patient expects to be discharged to:: Other (Comment) Living Arrangements: Alone               Additional Comments: Wants to go home with his power of attorney (his cousin)    Prior Function Level of Independence: Independent               Hand Dominance   Dominant Hand: Right    Extremity/Trunk Assessment   Upper Extremity Assessment Upper Extremity Assessment:  Overall WFL for tasks assessed(Bilat UE strength grossly assessed)    Lower Extremity Assessment Lower Extremity Assessment: Overall WFL for tasks assessed(Grossly assessed based on stand pivot transfer)    Cervical / Trunk Assessment Cervical / Trunk Assessment: Normal  Communication   Communication: No difficulties  Cognition Arousal/Alertness:  Awake/alert Behavior During Therapy: WFL for tasks assessed/performed Overall Cognitive Status: Within Functional Limits for tasks assessed                                        General Comments General comments (skin integrity, edema, etc.): Pt asking during session whether he will have to use a WC for the rest of his life. PT educated pt on importance of complying with WB restrictions while his wounds heal and stated that we can't say for certain what assistive device he may end up using since we don't know how he'll heal. Pt was placated by PT response but seemed somewhat down after hearing this.        Assessment/Plan    PT Assessment Patient needs continued PT services  PT Problem List Decreased activity tolerance;Decreased strength;Decreased balance;Decreased mobility;Decreased knowledge of use of DME;Decreased knowledge of precautions;Decreased skin integrity       PT Treatment Interventions DME instruction;Functional mobility training;Therapeutic activities;Therapeutic exercise;Patient/family education;Wheelchair mobility training;Balance training    PT Goals (Current goals can be found in the Care Plan section)  Acute Rehab PT Goals Patient Stated Goal: to go home with his power of attorney (his cousin) PT Goal Formulation: With patient Time For Goal Achievement: 02/02/18 Potential to Achieve Goals: Poor(will depend on the course of his stay and healing)    Frequency   3 xs/wk   Barriers to discharge Inaccessible home environment;Decreased caregiver support Pt's previous living situation unclear at this time; pt wants to go home with his power of attorney (his cousin) but it's unclear at this time how much help his cousin would be able to provide.       AM-PAC PT "6 Clicks" Daily Activity  Outcome Measure Difficulty turning over in bed (including adjusting bedclothes, sheets and blankets)?: None Difficulty moving from lying on back to sitting on the  side of the bed? : None Difficulty sitting down on and standing up from a chair with arms (e.g., wheelchair, bedside commode, etc,.)?: None Help needed moving to and from a bed to chair (including a wheelchair)?: A Little Help needed walking in hospital room?: Total Help needed climbing 3-5 steps with a railing? : Total 6 Click Score: 17    End of Session Equipment Utilized During Treatment: Gait belt;Other (comment)(Post operative shoes per Dr. Meridee Score) Activity Tolerance: Patient tolerated treatment well(Pt reported no pain, dizziness, lightheadedness, or N/V.) Patient left: in chair;with call bell/phone within reach;with chair alarm set Nurse Communication: Mobility status;Weight bearing status;Precautions;Other (comment)(Communicated pt's question regarding WC and overall mood) PT Visit Diagnosis: Unsteadiness on feet (R26.81);Muscle weakness (generalized) (M62.81);Difficulty in walking, not elsewhere classified (R26.2)    Time: 4536-4680 PT Time Calculation (min) (ACUTE ONLY): 24 min   Charges:         Wells Guiles B. Izyan Ezzell, PT, DPT [phone]   PT Evaluation $PT Eval Moderate Complexity: 1 Mod PT Treatments $Therapeutic Activity: 8-22 mins   01/19/2018, 3:55 PM

## 2018-01-19 NOTE — Progress Notes (Addendum)
PROGRESS NOTE    Christian Lamb  QMV:[id number] DOB: [birth year] DOA: 01/16/2018 PCP: Care, [name]   Brief Narrative:  42 year old with past medical history relevant for schizoaffective disorder, ulcerative colitis status post ileal pouch and anal anastomosis with endoscopic evaluation in June 2019 suggesting possible Crohn's disease, COPD, hypertension, chronic tachycardia coming in with necrotic toes bilaterally secondary to frostbite right greater than left with x-ray evidence of osteomyelitis.  Assessment & Plan:   Principal Problem:   Osteomyelitis ([place]) Active Problems:   Schizoaffective disorder, bipolar type (HCC)   Tobacco use disorder   Chronic diarrhea   Ulcerative colitis with complication (HCC)   Necrotic toes (HCC)   #) Necrotic toes, complicated by osteomyelitis on right: X-ray shows early osteomyelitis on right.  Patient apparently was supposed to have outpatient follow-up with orthopedics for this but did not make the appointment. 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 I called but went straight to voicemail  Disposition: Transferred to [facility] for orthopedic  evaluation  Full code   Consultants:   Orthopedic surgery   Procedures: per ortho  Antimicrobials: and ancef  IV cefepime and vancomycin started 01/16/2018   Subjective: No new complaints reported to me today.  Objective: Vitals:   01/18/18 1600 01/18/18 1615 01/18/18 2141 01/19/18 0500  BP: 111/82 113/86 113/79 119/81  Pulse: 73 67 77 88  Resp: 16 16    Temp: (!) 97.3 F (36.3 C) 97.6 F (36.4 C) 98 F (36.7 C)   TempSrc:  Oral Oral   SpO2: 100% 100% 100% 100%  Weight:      Height:        Intake/Output Summary (Last 24 hours) at 01/19/2018 1301 Last data filed at 01/19/2018 1100 Gross per 24 hour  Intake 860 ml  Output 34 ml  Net 826 ml   Filed Weights   01/16/18 1631  Weight: 59 kg (130 lb)    Examination:  General exam: awake and alert. In Nad. Respiratory system: Clear to auscultation. Respiratory effort normal. Equal chest rise.  Cardiovascular system: Tachycardic, regular rate and rhythm, no murmurs Gastrointestinal system: Soft, nondistended, plus bowel sounds, numerous well-healed incisions over abdomen Central nervous system: Alert and oriented. No focal neurological deficits. Extremities: No lower extremity edema, 2+ DP and PT pulses. Skin: gauze over right foot Psychiatry: Judgement and insight appear poor. Mood & affect flat  Data Reviewed: I have personally reviewed following labs and imaging studies  CBC: Recent Labs  Lab 01/16/18 1910 01/17/18 0900  WBC 11.5* 7.4  NEUTROABS 8.5*  --   HGB 12.3* 11.9*  HCT 39.5 37.5*  MCV 92.5 92.4  PLT 268 027   Basic Metabolic Panel: Recent Labs  Lab 01/16/18 1910 01/17/18 0900 01/18/18 0639  NA 138 138  --   K 4.2 3.9  --   CL 103  105  --   CO2 26 26  --   GLUCOSE 91 121*  --   BUN 21* 14  --   CREATININE 0.96 1.05 0.90  CALCIUM 9.1 9.0  --    GFR: Estimated Creatinine Clearance: 89.2 mL/min (by C-G formula based on SCr of 0.9 mg/dL). Liver Function Tests: No results for  input(s): AST, ALT, ALKPHOS, BILITOT, PROT, ALBUMIN in the last 168 hours. No results for input(s): LIPASE, AMYLASE in the last 168 hours. No results for input(s): AMMONIA in the last 168 hours. Coagulation Profile: No results for input(s): INR, PROTIME in the last 168 hours. Cardiac Enzymes: No results for input(s): CKTOTAL, CKMB, CKMBINDEX, TROPONINI in the last 168 hours. BNP (last 3 results) No results for input(s): PROBNP in the last 8760 hours. HbA1C: No results for input(s): HGBA1C in the last 72 hours. CBG: No results for input(s): GLUCAP in the last 168 hours. Lipid Profile: No results for input(s): CHOL, HDL, LDLCALC, TRIG, CHOLHDL, LDLDIRECT in the last 72 hours. Thyroid Function Tests: No results for input(s): TSH, T4TOTAL, FREET4, T3FREE, THYROIDAB in the last 72 hours. Anemia Panel: No results for input(s): VITAMINB12, FOLATE, FERRITIN, TIBC, IRON, RETICCTPCT in the last 72 hours. Sepsis Labs: Recent Labs  Lab 01/16/18 1916  LATICACIDVEN 0.62    Recent Results (from the past 240 hour(s))  Wound or Superficial Culture     Status: None (Preliminary result)   Collection Time: 01/16/18  6:36 PM  Result Value Ref Range Status   Specimen Description   Final    FOOT RIGHT Performed at [facility] [address] [place], [place] [address]    Special Requests   Final    NONE Performed at [facility], [place] [address] [place], [place] [address]    Gram Stain   Final    RARE WBC PRESENT, PREDOMINANTLY PMN MODERATE GRAM POSITIVE COCCI IN PAIRS RARE GRAM NEGATIVE RODS    Culture   Final    CULTURE REINCUBATED FOR BETTER GROWTH Performed at [facility], [place] [address] [place], [place] [address]    Report Status PENDING  Incomplete    Radiology Studies: No results found. Scheduled Meds: . atenolol  12.5 mg Oral Daily  . benztropine  0.5 mg Oral Nightly  . budesonide  9 mg Oral Daily  . docusate sodium  100 mg Oral  BID  . feeding supplement (ENSURE ENLIVE)  237 mL Oral BID BM  . mesalamine  4 g Rectal QHS  . paliperidone  12 mg Oral QHS  . sodium bicarbonate  650 mg Oral Daily   Continuous Infusions: . sodium chloride    . lactated ringers Stopped (01/18/18 1518)  . methocarbamol (ROBAXIN)  IV    . piperacillin-tazobactam (ZOSYN)  IV Stopped (01/19/18 0955)  . vancomycin 1,250 mg (01/18/18 1714)     LOS: 3 days    Time spent: [place], MD [facility]  If 7PM-7AM, please contact night-coverage [email or web address] Password [email or web address] 01/19/2018, 1:01 PM

## 2018-01-19 NOTE — Progress Notes (Signed)
Patient ID: Christian Lamb, male   DOB: [birth year], 42 y.o.   MRN: [id number] Patient is postoperative day 1 status post amputation of the toes on the right foot for osteomyelitis abscess and gangrene of the toes.  Patient also has an ulcer on the fifth metatarsal head left foot with partial thickness eschar to the left toes and we will continue to follow the left foot expectantly.  Patient has postoperative shoes he needs to be weightbearing for transfers only with ambulation in a wheelchair.  Patient will need discharge to skilled nursing.

## 2018-01-20 LAB — CREATININE, SERUM: CREATININE: 0.92 mg/dL (ref 0.61–1.24)

## 2018-01-20 LAB — AEROBIC CULTURE  (SUPERFICIAL SPECIMEN)

## 2018-01-20 LAB — AEROBIC CULTURE W GRAM STAIN (SUPERFICIAL SPECIMEN)

## 2018-01-20 MED ORDER — NICOTINE 7 MG/24HR TD PT24
7.0000 mg | MEDICATED_PATCH | Freq: Every day | TRANSDERMAL | Status: DC
  Administered 2018-01-20 – 2018-01-21 (×2): 7 mg via TRANSDERMAL
  Filled 2018-01-20 (×2): qty 1

## 2018-01-20 MED ORDER — AMOXICILLIN-POT CLAVULANATE 875-125 MG PO TABS
1.0000 | ORAL_TABLET | Freq: Two times a day (BID) | ORAL | Status: DC
  Administered 2018-01-20 – 2018-01-21 (×3): 1 via ORAL
  Filled 2018-01-20 (×3): qty 1

## 2018-01-20 NOTE — Social Work (Signed)
CSW f/u with [facility] for placement.  CSW faxed 30 day note and fl2 to [facility] for [facility].  CSW will continue to follow for same.  Elissa Hefty, LCSW Clinical Social Worker [phone]

## 2018-01-20 NOTE — Progress Notes (Signed)
PROGRESS NOTE    Christian Lamb  IRJ:[id number] DOB: 05-27-1976 DOA: 01/16/2018 PCP: [facility], [name]   Brief Narrative:  42 year old with past medical history relevant for schizoaffective disorder, ulcerative colitis status post ileal pouch and anal anastomosis with endoscopic evaluation in June 2019 suggesting possible Crohn's disease, COPD, hypertension, chronic tachycardia coming in with necrotic toes bilaterally secondary to frostbite right greater than left with x-ray evidence of osteomyelitis.  Assessment & Plan:   Principal Problem:   Osteomyelitis ([place]) Active Problems:   Schizoaffective disorder, bipolar type (HCC)   Tobacco use disorder   Chronic diarrhea   Ulcerative colitis with complication (HCC)   Necrotic toes (HCC)    Necrotic toes, complicated by osteomyelitis on right: X-ray shows early osteomyelitis on right.  Patient apparently was supposed to have outpatient follow-up with orthopedics for this but did not make the appointment. -Continue IV cefepime and vancomycin, Awaiting cultures before narrowing currently pending. - Patient is status post amputation of the toes on the right for osteomyelitis/abscess and gangrene of the toes. 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 I called but went  straight to voicemail  Disposition: Transferred to [facility] for orthopedic evaluation  Full code   Consultants:   Orthopedic surgery   Procedures: per ortho  Antimicrobials: and ancef  IV cefepime and vancomycin started 01/16/2018   Subjective: Pt is requesting nicotine patch as he smokes 2 cigs per day.  Objective: Vitals:   01/19/18 0500 01/19/18 1338 01/19/18 2048 01/20/18 0444  BP: 119/81 102/73 101/80 105/74  Pulse: 88 (!) 109 90 90  Resp:  16 15 16   Temp:  97.9 F (36.6 C) 98.6 F (37 C) 98.4 F (36.9 C)  TempSrc:  Oral Oral Oral  SpO2: 100% 97% 100% 99%  Weight:      Height:        Intake/Output Summary (Last 24 hours) at 01/20/2018 1015 Last data filed at 01/20/2018 0900 Gross per 24 hour  Intake 1560 ml  Output 12 ml  Net 1548 ml   Filed Weights   01/16/18 1631  Weight: 59 kg (130 lb)    Examination:  General exam: awake and alert. In Nad. Respiratory system: Clear to auscultation. Respiratory effort normal. Equal chest rise.  Cardiovascular system:  regular rate and rhythm, no murmurs Gastrointestinal system: Soft, nondistended, plus bowel sounds, numerous well-healed incisions over abdomen Central nervous system: Alert and oriented. No focal neurological deficits. Extremities: No lower extremity edema, 2+ DP and PT pulses. Skin: gauze over right foot Psychiatry: Judgement and insight appear poor. Mood & affect flat  Data Reviewed: I have personally reviewed following labs and imaging studies  CBC: Recent Labs  Lab 01/16/18 1910 01/17/18 0900  WBC 11.5* 7.4  NEUTROABS 8.5*  --   HGB 12.3* 11.9*  HCT 39.5 37.5*  MCV 92.5 92.4  PLT 268 355   Basic Metabolic Panel: Recent Labs  Lab 01/16/18 1910 01/17/18  0900 01/18/18 0639 01/20/18 0515  NA 138 138  --   --   K 4.2 3.9  --   --   CL 103 105  --   --   CO2 26 26  --   --   GLUCOSE 91 121*  --   --   BUN 21* 14  --   --   CREATININE 0.96 1.05 0.90 0.92  CALCIUM 9.1 9.0  --   --      GFR: Estimated Creatinine Clearance: 87.3 mL/min (by C-G formula based on SCr of 0.92 mg/dL). Liver Function Tests: No results for input(s): AST, ALT, ALKPHOS, BILITOT, PROT, ALBUMIN in the last 168 hours. No results for input(s): LIPASE, AMYLASE in the last 168 hours. No results for input(s): AMMONIA in the last 168 hours. Coagulation Profile: No results for input(s): INR, PROTIME in the last 168 hours. Cardiac Enzymes: No results for input(s): CKTOTAL, CKMB, CKMBINDEX, TROPONINI in the last 168 hours. BNP (last 3 results) No results for input(s): PROBNP in the last 8760 hours. HbA1C: No results for input(s): HGBA1C in the last 72 hours. CBG: No results for input(s): GLUCAP in the last 168 hours. Lipid Profile: No results for input(s): CHOL, HDL, LDLCALC, TRIG, CHOLHDL, LDLDIRECT in the last 72 hours. Thyroid Function Tests: No results for input(s): TSH, T4TOTAL, FREET4, T3FREE, THYROIDAB in the last 72 hours. Anemia Panel: No results for input(s): VITAMINB12, FOLATE, FERRITIN, TIBC, IRON, RETICCTPCT in the last 72 hours. Sepsis Labs: Recent Labs  Lab 01/16/18 1916  LATICACIDVEN 0.62    Recent Results (from the past 240 hour(s))  Wound or Superficial Culture     Status: None (Preliminary result)   Collection Time: 01/16/18  6:36 PM  Result Value Ref Range Status   Specimen Description   Final    FOOT RIGHT Performed at [facility] [address] [place], [place] [address]    Special Requests   Final    NONE Performed at [facility], [place] [address] [place], [place] [address]    Gram Stain   Final    RARE WBC PRESENT, PREDOMINANTLY PMN MODERATE GRAM POSITIVE COCCI IN PAIRS RARE GRAM NEGATIVE RODS    Culture   Final    CULTURE REINCUBATED FOR BETTER GROWTH Performed at [facility], [place] [address], [place], [place] [address]    Report Status PENDING  Incomplete    Radiology Studies: No results found. Scheduled  Meds: . atenolol  12.5 mg Oral Daily  . benztropine  0.5 mg Oral Nightly  . budesonide  9 mg Oral Daily  . docusate sodium  100 mg Oral BID  . feeding supplement (ENSURE ENLIVE)  237 mL Oral BID BM  . mesalamine  4 g Rectal QHS  . nicotine  7 mg Transdermal Daily  . paliperidone  12 mg Oral QHS  . sodium bicarbonate  650 mg Oral Daily   Continuous Infusions: . sodium chloride    . lactated ringers Stopped (01/18/18 1518)  . methocarbamol (ROBAXIN)  IV    . piperacillin-tazobactam (ZOSYN)  IV Stopped (01/20/18 0958)  . vancomycin Stopped (01/19/18 1855)     LOS: 4 days    Time spent: [place], MD [facility]  If 7PM-7AM, please contact night-coverage [email or web address] Password [email or web address] 01/20/2018, 10:15 AM

## 2018-01-20 NOTE — Social Work (Signed)
Patient will need 30 day note signed and long form FL2 completed.  CSW will f/u with doctor on same.  Elissa Hefty, LCSW Clinical Social Worker [phone]

## 2018-01-20 NOTE — Clinical Social Work Note (Signed)
Clinical Social Work Assessment  Patient Details  Name: Christian Lamb MRN: [id number] Date of Birth: 12/11/1975  Date of referral:  01/20/18               Reason for consult:  Facility Placement                Permission sought to share information with:  Facility Contact Representative Permission granted to share information::  Yes, Release of Information Signed  Name::     Michael Russell  Agency::  SNF  Relationship::     Contact Information:     Housing/Transportation Living arrangements for the past 2 months:  Apartment Source of Information:  Patient, Other (Comment Required) Patient Interpreter Needed:  None Criminal Activity/Legal Involvement Pertinent to Current Situation/Hospitalization:  No - Comment as needed Significant Relationships:    Lives with:  Self Do you feel safe going back to the place where you live?  No Need for family participation in patient care:  Yes (Comment)  Care giving concerns:  Pt has new impairment and will need short term rehabilitative.  Social Worker assessment / plan:  CSW met with patient at bedside and he indicated that he is agreeable to SNF but does desire to go home with Michael Russell, cousin who is POA. He gave CSW permission to send referrals and indicated that he was interested in [facility] [place].   CSW then contacted POA,Michael, who confirmed that patient did reside on his own, but was not able to care for himself. He agreed that patient will need SNF and he prefers [facility] [place] as patient has been in the past. CSW explained the SNF process and placement. CSW obtained permission to send referral.  CSW will f/u for disposition.  Employment status:  Disabled (Comment on whether or not currently receiving Disability) Insurance information:  Medicaid In State PT Recommendations:  Skilled Nursing Facility Information / Referral to community resources:  Skilled Nursing Facility  Patient/Family's  Response to care:  Patient/family appreciative of the plan at discharge to go to rehabilitative therapy in a facility. CSW will assist.  Patient/Family's Understanding of and Emotional Response to Diagnosis, Current Treatment, and Prognosis:  Patient/family has good understanding of new impairment. Pt understands that he will need short term rehab at discharge and then will reside with family cousin Michael Russel. Pt will have family support when he returns to community and may move with cousin in [place]. CSW validated and will continue to provide support with disposition.  Emotional Assessment Appearance:  Appears stated age Attitude/Demeanor/Rapport:  (Cooperative) Affect (typically observed):  Accepting Orientation:  Oriented to Situation, Oriented to  Time, Oriented to Place, Oriented to Self Alcohol / Substance use:  Not Applicable Psych involvement (Current and /or in the community):  No (Comment)  Discharge Needs  Concerns to be addressed:  Discharge Planning Concerns Readmission within the last 30 days:  No Current discharge risk:  Dependent with Mobility, Physical Impairment, Lives alone Barriers to Discharge:  No Barriers Identified    V , LCSW 01/20/2018, 10:56 AM  

## 2018-01-20 NOTE — Progress Notes (Signed)
Patient ID: Christian Lamb, male   DOB: 07-12-1976, 42 y.o.   MRN: [id number] Patient without complaints this morning.  Discussed the importance of working on dorsiflexion exercises of the ankle.  No drainage in the wound VAC canister.  Plan for discharge to skilled nursing.  Patient states he [facility] the [facility].

## 2018-01-20 NOTE — Progress Notes (Signed)
Spiritual support for patient.  Had prayer for his healing to go well-he expressed that as his concern. Conard Novak, Chaplain   01/20/18 1100  Clinical Encounter Type  Visited With Patient;Health care provider  Visit Type Initial;Spiritual support  Referral From Nurse  Consult/Referral To Chaplain  Stress Factors  Patient Stress Factors Other (Comment) (healing process to go well)  Family Stress Factors None identified

## 2018-01-20 NOTE — NC FL2 (Signed)
[place] LEVEL OF CARE SCREENING TOOL     IDENTIFICATION  Patient Name: Christian Lamb Birthdate: Feb 22, 1976 Sex: male Admission Date (Current Location): 01/16/2018  [facility] and [place] Number:  Herbalist and Address:  [facility] [place], [place] [address] [place], [place] [address]      Provider Number: [id number]  Attending Physician Name and Address:  Velvet Bathe, MD  Relative Name and Phone Number:  [name], cousin,     Current Level of Care: Hospital Recommended Level of Care: [place] Prior Approval Number:    Date Approved/Denied:   PASRR Number: pending  Discharge Plan: SNF    Current Diagnoses: Patient Active Problem List   Diagnosis Date Noted  . Osteomyelitis ([place]) 01/16/2018  . Necrotic toes ([place]) 01/16/2018  . 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 Anemia of chronic disease 12/05/2017  . AKI (acute kidney injury) ([place]) 12/05/2017  . Rhabdomyolysis 12/05/2017  . Elevated liver enzymes 12/05/2017  . Protein-calorie malnutrition, severe 12/05/2017  . Sepsis ([place]) 12/04/2017  . Loss of weight 03/21/2017  . Chronic diarrhea 03/21/2017  . Ulcerative colitis with complication ([place]) 41/32/4401  . Tobacco use disorder 02/12/2016  . Schizoaffective disorder, bipolar type ([place]) 02/11/2016  . Chronic tachycardia 01/13/2015  . Chronic ulcerative enterocolitis ([place]) 01/13/2015  . 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    Orientation RESPIRATION BLADDER Height & Weight     Self, Time, Situation, Place  Normal Continent Weight: 130 lb (59 kg) Height:  6' (182.9 cm)  BEHAVIORAL SYMPTOMS/MOOD NEUROLOGICAL BOWEL NUTRITION STATUS      Continent Diet(see DC summary)  AMBULATORY STATUS COMMUNICATION OF NEEDS Skin   Extensive Assist Verbally Surgical wounds, Wound Vac                       Personal Care Assistance Level of Assistance  Dressing, Feeding, Bathing Bathing Assistance: Limited  assistance Feeding assistance: Limited assistance Dressing Assistance: Limited assistance     Functional Limitations Info  Sight, Hearing, Speech Sight Info: Adequate Hearing Info: Adequate Speech Info: Adequate    SPECIAL CARE FACTORS FREQUENCY  PT (By licensed PT), OT (By licensed OT)     PT Frequency: 3x week OT Frequency: 3x week            Contractures      Additional Factors Info  Code Status, Allergies, Psychotropic Code Status Info: Full Allergies Info: No Known Allergies Psychotropic Info: Invega, Cogentin         Current Medications (01/20/2018):  This is the current hospital active medication list Current Facility-Administered Medications  Medication Dose Route Frequency Provider Last Rate Last Dose  . 0.9 %  sodium chloride infusion   Intravenous Continuous Newt Minion, MD      . atenolol (TENORMIN) tablet 12.5 mg  12.5 mg Oral Daily Newt Minion, MD   12.5 mg at 01/20/18 0959  . benztropine (COGENTIN) tablet 0.5 mg  0.5 mg Oral Nightly Newt Minion, MD      . bisacodyl (DULCOLAX) suppository 10 mg  10 mg Rectal Daily PRN Newt Minion, MD      . budesonide (ENTOCORT EC) 24 hr capsule 9 mg  9 mg Oral Daily Newt Minion, MD   9 mg at 01/20/18 0956  . chlorproMAZINE (THORAZINE) tablet 25 mg  25 mg Oral TID PRN Newt Minion, MD      . docusate sodium (COLACE) capsule 100 mg  100  mg Oral BID Newt Minion, MD   100 mg at 01/20/18 5176  . feeding supplement (ENSURE ENLIVE) (ENSURE ENLIVE) liquid 237 mL  237 mL Oral BID BM Newt Minion, MD   237 mL at 01/20/18 1000  . ipratropium-albuterol (DUONEB) 0.5-2.5 (3) MG/3ML nebulizer solution 3 mL  3 mL Nebulization Q4H PRN Newt Minion, MD      . lactated ringers infusion   Intravenous Continuous Newt Minion, MD   Stopped at 01/18/18 1518  . loperamide (IMODIUM) capsule 2 mg  2 mg Oral TID PRN Newt Minion, MD   2 mg at 01/19/18 0608  . LORazepam (ATIVAN) tablet 0.5 mg  0.5 mg Oral Q4H PRN Newt Minion, MD      . magnesium citrate solution 1 Bottle  1 Bottle Oral Once PRN Newt Minion, MD      . mesalamine (ROWASA) enema 4 g  4 g Rectal QHS Newt Minion, MD      . methocarbamol (ROBAXIN) tablet 500 mg  500 mg Oral Q6H PRN Newt Minion, MD   500 mg at 01/19/18 1007   Or  . methocarbamol (ROBAXIN) 500 mg in dextrose 5 % 50 mL IVPB  500 mg Intravenous Q6H PRN Newt Minion, MD      . metoCLOPramide (REGLAN) tablet 5-10 mg  5-10 mg Oral Q8H PRN Newt Minion, MD       Or  . metoCLOPramide (REGLAN) injection 5-10 mg  5-10 mg Intravenous Q8H PRN Newt Minion, MD      . nicotine (NICODERM CQ - dosed in mg/24 hr) patch 7 mg  7 mg Transdermal Daily Velvet Bathe, MD      . ondansetron [facility]) tablet 4 mg  4 mg Oral Q6H PRN Newt Minion, MD       Or  . ondansetron [facility]) injection 4 mg  4 mg Intravenous Q6H PRN Newt Minion, MD      . paliperidone (INVEGA) 24 hr tablet 12 mg  12 mg Oral QHS Newt Minion, MD      . piperacillin-tazobactam (ZOSYN) IVPB 3.375 g  3.375 g Intravenous Q8H Newt Minion, MD   Stopped at 01/20/18 [phone]  . polyethylene glycol (MIRALAX / GLYCOLAX) packet 17 g  17 g Oral Daily PRN Newt Minion, MD      . sodium bicarbonate tablet 650 mg  650 mg Oral Daily Newt Minion, MD   650 mg at 01/20/18 0959  . traZODone (DESYREL) tablet 50 mg  50 mg Oral QHS PRN Newt Minion, MD      . vancomycin (VANCOCIN) 1,250 mg in sodium chloride 0.9 % 250 mL IVPB  1,250 mg Intravenous Q24H Newt Minion, MD   Stopped at 01/19/18 1855     Discharge Medications: Please see discharge summary for a list of discharge medications.  Relevant Imaging Results:  Relevant Lab Results:   Additional Information SS#: [id number]  Normajean [place], LCSW

## 2018-01-21 DIAGNOSIS — M19071 Primary osteoarthritis, right ankle and foot: Secondary | ICD-10-CM

## 2018-01-21 MED ORDER — AMOXICILLIN-POT CLAVULANATE 875-125 MG PO TABS: 1.0000 | ORAL_TABLET | Freq: Two times a day (BID) | ORAL | 0 refills | Status: AC

## 2018-01-21 MED ORDER — LOPERAMIDE HCL 2 MG PO CAPS: 2.0000 mg | ORAL_CAPSULE | Freq: Three times a day (TID) | ORAL | 2 refills | Status: DC | PRN

## 2018-01-21 NOTE — Progress Notes (Signed)
Report called to Danae Chen, nurse at [facility].

## 2018-01-21 NOTE — Social Work (Signed)
Clinical Social Worker facilitated patient discharge including contacting patient family and facility to confirm patient discharge plans.  Clinical information faxed to facility and family agreeable with plan.    CSW arranged ambulance transport via [facility] to [facility] of [place].    RN to call [phone] to give report prior to discharge.  Clinical Social Worker will sign off for now as social work intervention is no longer needed. Please consult [place] again if new need arises.  Elissa Hefty, LCSW Clinical Social Worker [phone]

## 2018-01-21 NOTE — Plan of Care (Signed)

## 2018-01-21 NOTE — Clinical Social Work Placement (Signed)
   CLINICAL SOCIAL WORK PLACEMENT  NOTE  Date:  01/21/2018  Patient Details  Name: Christian Lamb MRN: [id number] Date of Birth: [birth year]  Clinical Social Work is seeking post-discharge placement for this patient at the [place] level of care (*CSW will initial, date and re-position this form in  chart as items are completed):  Yes   Patient/family provided with [facility] Work Department's list of facilities offering this level of care within the geographic area requested by the patient (or if unable, by the patient's family).  Yes   Patient/family informed of their freedom to choose among providers that offer the needed level of care, that participate in Medicare, Medicaid or managed care program needed by the patient, have an available bed and are willing to accept the patient.  Yes   Patient/family informed of [facility]'s ownership interest in [facility] and [facility], as well as of the fact that they are under no obligation to receive care at these facilities.  PASRR submitted to EDS on       PASRR number received on 01/21/18     Existing PASRR number confirmed on       FL2 transmitted to all facilities in geographic area requested by pt/family on 01/20/18     FL2 transmitted to all facilities within larger geographic area on       Patient informed that his/her managed care company has contracts with or will negotiate with certain facilities, including the following:        Yes   Patient/family informed of bed offers received.  Patient chooses bed at [facility]     Physician recommends and patient chooses bed at      Patient to be transferred to [facility] on 01/21/18.  Patient to be transferred to facility by [facility]     Patient family notified on 01/21/18 of transfer.  Name of family member notified:  [name] contacted     PHYSICIAN       Additional Comment:     _______________________________________________ Normajean Baxter, LCSW 01/21/2018, 12:46 PM

## 2018-01-21 NOTE — Social Work (Addendum)
CSW f/u with [facility] for [facility] placement. CSW left message for admission staff.  CSW received [facility]#[id number] for SNF placement.  CSW will f/u.  9:00am SNF [facility] offered SNF bed.   CSW will f/u for disposition.  Elissa Hefty, LCSW Clinical Social Worker [phone]

## 2018-01-21 NOTE — Discharge Summary (Signed)
Physician Discharge Summary  Christian Lamb ZOX:[id number] DOB: 17-Jul-1976 DOA: 01/16/2018  PCP: Care, [name]  Admit date: 01/16/2018 Discharge date: 01/21/2018  Time spent:  35 minutes  Recommendations for Outpatient Follow-up:  1. Please be sure that patient f/u with Dr Sharol Given as outpatient. Wound care and all of that per orthopaedic surgery 2. D/c on antibiotics for 7 days after hospital discharge.  Discharge Diagnoses:  Principal Problem:   Osteomyelitis ([place]) Active Problems:   Schizoaffective disorder, bipolar type ([place])   Tobacco use disorder   Chronic diarrhea   Ulcerative colitis with complication (HCC)   Necrotic toes (HCC)   Discharge Condition: stable  Diet recommendation: regular diet  Filed Weights   01/16/18 1631  Weight: 59 kg (130 lb)    History of present illness:  42 y.o. male with medical history significant for schizo-affective disorder, ulcerative colitis with chronic diarrhea, tobacco abuse who presented to the ED with complaints of drainage from his right toes of one day duration.  Pt diagnosed with osteomyelitis and Ortho Dr Sharol Given and patient is s/p amputation of the toes on the right for osteomyelitis/abscess and gangrene of the toes.  Hospital Course:  Osteomyelitis/abscess - as mentioned above patient is s/p amputation - treat with 7 more days of antibiotics on d/c can be prolonged pending course at SNF  Otherwise continue prior to admission medication regimen for known medical problems listed above prior to admission.   Procedures:  See above  Consultations:  Dr Sharol Given  Discharge Exam: Vitals:   01/20/18 2044 01/21/18 0428  BP: 102/74 98/75  Pulse: 83 81  Resp: 17 16  Temp: 98 F (36.7 C) 98.3 F (36.8 C)  SpO2: 97% 99%    General: Pt in nad, alert and awake Cardiovascular: rrr, no rubs Respiratory: no increased wob, no wheezes  Discharge Instructions   Discharge Instructions    Call MD for:  severe  uncontrolled pain   Complete by:  As directed    Call MD for:  temperature >100.4   Complete by:  As directed    Diet - low sodium heart healthy   Complete by:  As directed    Discharge instructions   Complete by:  As directed    Please ensure you follow up with Dr. Sharol Given your orthopaedic specialist.   Increase activity slowly   Complete by:  As directed      Allergies as of 01/21/2018   No Known Allergies     Medication List    STOP taking these medications   LORazepam 0.5 MG tablet Commonly known as:  ATIVAN   oxyCODONE 5 MG immediate release tablet Commonly known as:  Oxy IR/ROXICODONE     TAKE these medications   amoxicillin-clavulanate 875-125 MG tablet Commonly known as:  AUGMENTIN Take 1 tablet by mouth every 12 (twelve) hours for 7 days.   atenolol 25 MG tablet Commonly known as:  TENORMIN Take 0.5 tablets (12.5 mg total) by mouth daily.   b complex vitamins tablet Take 1 tablet by mouth daily.   benztropine 0.5 MG tablet Commonly known as:  COGENTIN Take 1 tablet (0.5 mg total) by mouth Nightly.   budesonide 3 MG 24 hr capsule Commonly known as:  ENTOCORT EC Take 3 capsules (9 mg total) by mouth daily.   chlorproMAZINE 25 MG tablet Commonly known as:  THORAZINE Take 1 tablet (25 mg total) by mouth 3 (three) times daily as needed for hiccoughs.   diphenoxylate-atropine 2.5-0.025 MG tablet Commonly  known as:  LOMOTIL Take 1 tablet by mouth 4 (four) times daily.   feeding supplement (ENSURE ENLIVE) Liqd Take 237 mLs by mouth 2 (two) times daily between meals.   folic acid 1 MG tablet Commonly known as:  FOLVITE Take 1 tablet (1 mg total) by mouth daily.   Gerhardt's butt cream Crea Apply 1 application topically 2 (two) times daily.   ipratropium-albuterol 0.5-2.5 (3) MG/3ML Soln Commonly known as:  DUONEB Take 3 mLs by nebulization every 4 (four) hours as needed.   loperamide 2 MG capsule Commonly known as:  IMODIUM Take 1 capsule (2 mg  total) by mouth 3 (three) times daily as needed for diarrhea or loose stools. For diarrhea What changed:    when to take this  reasons to take this   mesalamine 4 g enema Commonly known as:  ROWASA Place 60 mLs (4 g total) rectally at bedtime.   multivitamin Tabs tablet Take 1 tablet by mouth daily.   nicotine 21 mg/24hr patch Commonly known as:  NICODERM CQ - dosed in mg/24 hours Place 1 patch (21 mg total) onto the skin daily.   ondansetron 4 MG tablet Commonly known as:  ZOFRAN Take 1 tablet (4 mg total) by mouth every 6 (six) hours as needed for nausea.   paliperidone 156 MG/ML Susp injection Commonly known as:  INVEGA SUSTENNA Inject 1 mL (156 mg total) into the muscle once.   paliperidone 6 MG 24 hr tablet Commonly known as:  INVEGA Take 2 tablets (12 mg total) by mouth at bedtime.   sodium bicarbonate 650 MG tablet Take 1 tablet (650 mg total) by mouth daily.   traZODone 50 MG tablet Commonly known as:  DESYREL Take 1 tablet (50 mg total) by mouth at bedtime as needed for sleep.      No Known Allergies Contact information for after-discharge care    [facility] .   Service:  Skilled Nursing Contact information: [address] [place] [phone]               The results of significant diagnostics from this hospitalization (including imaging, microbiology, ancillary and laboratory) are listed below for reference.    Significant Diagnostic Studies: Dg Foot Complete Right  Result Date: 01/16/2018 CLINICAL DATA:  Black toes.  Evaluate for osteomyelitis. EXAM: RIGHT FOOT COMPLETE - 3+ VIEW COMPARISON:  12/04/2017 FINDINGS: There is newly seen cortical erosion involving the tuft of the toe. No fracture or malalignment. No soft tissue gas or opaque foreign body. IMPRESSION: Early erosion of the great toe tuft that is new from 12/04/2017, presumed osteomyelitis. Electronically Signed   By:  Monte Fantasia M.D.   On: 01/16/2018 18:34    Microbiology: Recent Results (from the past 240 hour(s))  Wound or Superficial Culture     Status: Abnormal   Collection Time: 01/16/18  6:36 PM  Result Value Ref Range Status   Specimen Description   Final    FOOT RIGHT Performed at [facility] [address], [place], [place] [address]    Special Requests   Final    NONE Performed at [facility], [place] [address], [place], [place] [address]    Gram Stain   Final    RARE WBC PRESENT, PREDOMINANTLY PMN MODERATE GRAM POSITIVE COCCI IN PAIRS RARE GRAM NEGATIVE RODS Performed at [facility], [place] [address], [place], [place] [address]    Culture MULTIPLE  ORGANISMS PRESENT, NONE PREDOMINANT (A)  Final   Report Status 01/20/2018 FINAL  Final     Labs: Basic Metabolic Panel: Recent Labs  Lab 01/16/18 1910 01/17/18 0900 01/18/18 0639 01/20/18 0515  NA 138 138  --   --   K 4.2 3.9  --   --   CL 103 105  --   --   CO2 26 26  --   --   GLUCOSE 91 121*  --   --   BUN 21* 14  --   --   CREATININE 0.96 1.05 0.90 0.92  CALCIUM 9.1 9.0  --   --    Liver Function Tests: No results for input(s): AST, ALT, ALKPHOS, BILITOT, PROT, ALBUMIN in the last 168 hours. No results for input(s): LIPASE, AMYLASE in the last 168 hours. No results for input(s): AMMONIA in the last 168 hours. CBC: Recent Labs  Lab 01/16/18 1910 01/17/18 0900  WBC 11.5* 7.4  NEUTROABS 8.5*  --   HGB 12.3* 11.9*  HCT 39.5 37.5*  MCV 92.5 92.4  PLT 268 253   Cardiac Enzymes: No results for input(s): CKTOTAL, CKMB, CKMBINDEX, TROPONINI in the last 168 hours. BNP: BNP (last 3 results) No results for input(s): BNP in the last 8760 hours.  ProBNP (last 3 results) No results for input(s): PROBNP in the last 8760 hours.  CBG: No results for input(s): GLUCAP in the last 168 hours.  Signed:  Velvet Bathe MD.  [facility] 01/21/2018, 11:56 AM

## 2018-01-21 NOTE — Progress Notes (Signed)
Pt discharged to [facility] with transporters. All paperwork and scripts given to transporters. All belongings sent with patient. Prevena wound vac applied to patient. VSS. BP 103/72 (BP Location: Left Arm)   Pulse 98   Temp 98.5 F (36.9 C) (Oral)   Resp 16   Ht 6' (1.829 m)   Wt 59 kg (130 lb)   SpO2 98%   BMI 17.63 kg/m

## 2018-01-26 ENCOUNTER — Ambulatory Visit: Payer: Self-pay | Admitting: Nurse Practitioner

## 2018-01-28 ENCOUNTER — Encounter (INDEPENDENT_AMBULATORY_CARE_PROVIDER_SITE_OTHER): Payer: Self-pay

## 2018-01-28 ENCOUNTER — Ambulatory Visit (INDEPENDENT_AMBULATORY_CARE_PROVIDER_SITE_OTHER): Payer: Self-pay | Admitting: Orthopaedic Surgery

## 2018-01-28 ENCOUNTER — Ambulatory Visit (INDEPENDENT_AMBULATORY_CARE_PROVIDER_SITE_OTHER): Payer: Medicaid Other

## 2018-01-28 VITALS — Ht 72.0 in | Wt 130.0 lb

## 2018-01-28 DIAGNOSIS — I96 Gangrene, not elsewhere classified: Secondary | ICD-10-CM

## 2018-01-28 NOTE — Progress Notes (Signed)
Patient in today 10 days post op amputation of the toes on the right foot 01/18/18. Patient is currently at the [facility]. Pt's wound vac is alarming, the canister is empty and the pt states that it is not working properly. The vac is removed and the incision is well approximated with stitches intact. The foot is slightly swollen and the pt reports that he does have it down and dependant a lot of the day. There is spot amount of bloody drainage at the medial side of the incision. The skin is peeling and there is a half dollar sized dry intact eschar at the heel. ABD pads applied at the incision and the heel wrapped with kerlix and ace bandage. Orders written for the facility to wash the foot daily with Dial soap and water and to apply a dry dressing. The pt is non weight bearing in a wheelchair with post op shoe. Included in the order is for the pt to be weight bearing for transfers only. To keep foot elevated higher than his heart and to float the heels off of a pillow while in bed. Facility to call with any questions and pt will follow up with Dr. Sharol Given in one week.    Mikayla Chiusano, [place], [employer]

## 2018-02-05 ENCOUNTER — Ambulatory Visit (INDEPENDENT_AMBULATORY_CARE_PROVIDER_SITE_OTHER): Payer: Medicaid Other | Admitting: Orthopedic Surgery

## 2018-02-05 ENCOUNTER — Encounter (INDEPENDENT_AMBULATORY_CARE_PROVIDER_SITE_OTHER): Payer: Self-pay | Admitting: Orthopedic Surgery

## 2018-02-05 VITALS — Ht 71.0 in | Wt 131.0 lb

## 2018-02-05 DIAGNOSIS — I96 Gangrene, not elsewhere classified: Secondary | ICD-10-CM

## 2018-02-05 NOTE — Progress Notes (Signed)
Office Visit Note   Patient: Christian Lamb           Date of Birth: January 27, 1976           MRN: [id number] Visit Date: 02/05/2018              Requested by: Care, [name] [facility] [address], [place] [address] PCP: Renata Caprice, DO  Chief Complaint  Patient presents with  . Right Foot - Pain, Follow-up, Routine Post Op      HPI: Patient presents status post transmetatarsal amputation on the right.  Patient has large partial-thickness ulcers of both heels and multiple other aspects of both feet.  Assessment & Plan: Visit Diagnoses:  1. Necrotic toes (HCC)     Plan: Recommended starting Silvadene dressing changes with Dial soap cleansing to both feet daily with Silvadene to the heel ulcer as well as the other ischemic ulcers.  Patient is to float his heels off the bed and to be weightbearing only for transfers.  No gait training he is not to rest his heels on the bed.  Follow-Up Instructions: Return in about 1 month (around 03/05/2018).   Ortho Exam  Patient is alert, oriented, no adenopathy, well-dressed, normal affect, normal respiratory effort. Examination the black eschar on the heel ulcers is improving significantly approximately half of the black eschar was debrided in the office.  There is some healthy granulation tissue around the edges there is no purulence no cellulitis no signs of infection patient has multiple other small ischemic ulcers on both feet.  The surgical incision is healing well there is no redness no cellulitis no ischemic changes no wound dehiscence no signs of infection we will harvest the sutures today.  Imaging: No results found. No images are attached to the encounter.  Labs: Lab Results  Component Value Date   HGBA1C 4.9 02/12/2016   CRP 2.1 03/21/2017   REPTSTATUS 01/20/2018 FINAL 01/16/2018   GRAMSTAIN  01/16/2018    RARE WBC PRESENT, PREDOMINANTLY PMN MODERATE GRAM POSITIVE COCCI IN PAIRS RARE GRAM  NEGATIVE RODS Performed at [facility], [place] [address], [place], [place] [address]    CULT MULTIPLE ORGANISMS PRESENT, NONE PREDOMINANT (A) 01/16/2018    Lab Results  Component Value Date/Time   HGBA1C 4.9 02/12/2016 07:07 AM    Body mass index is 18.27 kg/m.  Orders:  No orders of the defined types were placed in this encounter.  No orders of the defined types were placed in this encounter.    Procedures: No procedures performed  Clinical Data: No additional findings.  ROS:  All other systems negative, except as noted in the HPI. Review of Systems  Objective: Vital Signs: Ht 5' 11"  (1.803 m)   Wt 131 lb (59.4 kg)   BMI 18.27 kg/m   Specialty Comments:  No specialty comments available.  PMFS History: Patient Active Problem List   Diagnosis Date Noted  . Osteomyelitis ([place]) 01/16/2018  . Necrotic toes ([place]) 01/16/2018  . 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 Anemia of chronic disease 12/05/2017  . AKI (acute kidney injury) ([place]) 12/05/2017  . Rhabdomyolysis 12/05/2017  . Elevated liver enzymes 12/05/2017  . Protein-calorie malnutrition, severe 12/05/2017  . Sepsis ([place]) 12/04/2017  . Loss of weight 03/21/2017  . Chronic diarrhea 03/21/2017  . Ulcerative colitis with complication ([place]) 43/15/4008  . Tobacco use disorder 02/12/2016  . Schizoaffective disorder, bipolar type ([place]) 02/11/2016  . Chronic tachycardia 01/13/2015  .  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 Anemia   . Anxiety   . Osteomyelitis ([place]) 01/2018   RIGHT FOOT  . Schizophrenia ([place])   . Ulcerative colitis ([place])     Family History  Problem Relation Age of Onset  . Colon cancer Father 2  . Stomach cancer Neg Hx     Past Surgical History:  Procedure Laterality Date  . AMPUTATION Right 01/18/2018   Procedure: AMPUTATION OF TOES;  Surgeon: Newt Minion, MD;  Location: [facility];   Service: Orthopedics;  Laterality: Right;  . SMALL INTESTINE SURGERY  2016   in [place]  . TOTAL COLECTOMY  2003   Social History   Occupational History  . Not on file  Tobacco Use  . Smoking status: Current Every Day Smoker    Packs/day: 0.25    Types: Cigarettes, Cigars  . Smokeless tobacco: Never Used  Substance and Sexual Activity  . Alcohol use: No    Alcohol/week: 0.0 oz  . Drug use: No  . Sexual activity: Not Currently

## 2018-02-10 ENCOUNTER — Telehealth: Payer: Self-pay | Admitting: Gastroenterology

## 2018-02-10 NOTE — Telephone Encounter (Signed)
Left message for Vickie P at [facility].

## 2018-02-11 NOTE — Telephone Encounter (Signed)
Spoke to [employer]. and let her know that the [place] is being prescribed QHS.

## 2018-02-12 ENCOUNTER — Telehealth (INDEPENDENT_AMBULATORY_CARE_PROVIDER_SITE_OTHER): Payer: Self-pay | Admitting: Orthopedic Surgery

## 2018-02-12 NOTE — Telephone Encounter (Signed)
I am sorry Christian Lamb, what does she need?  I can't tell from your message. Is this just an FYI to [place]?

## 2018-02-12 NOTE — Telephone Encounter (Signed)
[place]  [phone]    Surgery 01/18/18 Amputation of toes   Pt incision is healed and his suture as well. Pt also has wounds on heal Facility will treat pt heals.

## 2018-02-13 NOTE — Telephone Encounter (Signed)
FYI

## 2018-02-13 NOTE — Telephone Encounter (Signed)
See original message, Juluis Rainier

## 2018-02-25 ENCOUNTER — Other Ambulatory Visit (INDEPENDENT_AMBULATORY_CARE_PROVIDER_SITE_OTHER): Payer: Self-pay

## 2018-02-25 ENCOUNTER — Telehealth (INDEPENDENT_AMBULATORY_CARE_PROVIDER_SITE_OTHER): Payer: Self-pay | Admitting: Orthopedic Surgery

## 2018-02-25 ENCOUNTER — Telehealth (INDEPENDENT_AMBULATORY_CARE_PROVIDER_SITE_OTHER): Payer: Self-pay

## 2018-02-25 MED ORDER — SILVER SULFADIAZINE 1 % EX CREA: 1.0000 "application " | TOPICAL_CREAM | Freq: Every day | CUTANEOUS | 0 refills | Status: DC

## 2018-02-25 NOTE — Telephone Encounter (Signed)
[name] called stating that patient is needing Wound Care Orders.  Stated that [facility] saw patient on 02/24/18, but she did not have any wound care orders.  Stated that patient has a wound on his right and left foot and have a odor.  Scheduled an appt.for 02/26/18.  CB# for [facility] Vanessa with [facility] is [phone].  Cb# for Legrand Como is [phone].  Please advise.  Thank you.

## 2018-02-25 NOTE — Telephone Encounter (Signed)
Vanessa/Home Care Nurse called to stated patient home from rehab and they left no wound care instructions. Need to know what they need to do.  Please call to advise

## 2018-02-25 NOTE — Telephone Encounter (Signed)
This has already been done.

## 2018-02-25 NOTE — Telephone Encounter (Signed)
Per last dictation dail soap cleansing and silvadene dressing to heel and ischemic ulcers. Float heels off bed and wtb only for transfers. Rx faxed to [place] for silvadene.

## 2018-02-26 ENCOUNTER — Ambulatory Visit (INDEPENDENT_AMBULATORY_CARE_PROVIDER_SITE_OTHER): Payer: Medicaid Other | Admitting: Orthopedic Surgery

## 2018-02-26 ENCOUNTER — Ambulatory Visit (INDEPENDENT_AMBULATORY_CARE_PROVIDER_SITE_OTHER): Payer: Self-pay

## 2018-02-26 ENCOUNTER — Encounter (INDEPENDENT_AMBULATORY_CARE_PROVIDER_SITE_OTHER): Payer: Self-pay | Admitting: Orthopedic Surgery

## 2018-02-26 DIAGNOSIS — M79671 Pain in right foot: Secondary | ICD-10-CM

## 2018-02-26 DIAGNOSIS — I96 Gangrene, not elsewhere classified: Secondary | ICD-10-CM

## 2018-02-26 NOTE — Progress Notes (Signed)
Office Visit Note   Patient: Christian Lamb           Date of Birth: [birth year]           MRN: [id number] Visit Date: 02/26/2018              Requested by: Renata Caprice, DO [address] [place], [place] [address] PCP: Renata Caprice, DO  Chief Complaint  Patient presents with  . Left Foot - Follow-up, Wound Check  . Right Foot - Follow-up, Wound Check      HPI: Patient is a 42 year old gentleman who presents follow-up status post right transmetatarsal amputation with gangrene of both heels ulceration of the left toes status post frostbite and transmetatarsal amputation of the right patient presents with his power of attorney for evaluation of the heel ulcers.  Patient has just been released from the [facility] and the patient's power of attorney states that they will be taking him out of town for further medical care.  Assessment & Plan: Visit Diagnoses:  1. Pain in right foot   2. Necrotic toes ([place])     Plan: Dressings were applied we will call advanced home care to try to set him up for 3 times a week skilled nursing dressing changes.  Recommended obtaining a primary care physician so the patient's medications and doctors can be coordinated from a central source.  Follow-Up Instructions: Return in about 3 weeks (around 03/19/2018).   Ortho Exam  Patient is alert, oriented, no adenopathy, well-dressed, normal affect, normal respiratory effort. Examination patient has large thick eschar on both heels eschar on the fifth toe and third toe of the left foot with a stable transmetatarsal amputation of the right.  Examination of the right foot has no skin breakdown across the incision and this has healed nicely there is large black eschar on both heels.  After informed consent the eschar skin and soft tissue was debrided from both heels the ulcers bilaterally are 5 cm in diameter with good healthy granulation tissue around the wound edges.  Imaging: No results found. No images are  attached to the encounter.  Labs: Lab Results  Component Value Date   HGBA1C 4.9 02/12/2016   CRP 2.1 03/21/2017   REPTSTATUS 01/20/2018 FINAL 01/16/2018   GRAMSTAIN  01/16/2018    RARE WBC PRESENT, PREDOMINANTLY PMN MODERATE GRAM POSITIVE COCCI IN PAIRS RARE GRAM NEGATIVE RODS Performed at [facility], [place] [address] [place], [place] [address]    CULT MULTIPLE ORGANISMS PRESENT, NONE PREDOMINANT (A) 01/16/2018     Lab Results  Component Value Date   ALBUMIN 2.0 (L) 12/18/2017   ALBUMIN 1.5 (L) 12/13/2017   ALBUMIN 1.3 (L) 12/11/2017   PREALBUMIN 5.2 (L) 12/05/2017    There is no height or weight on file to calculate BMI.  Orders:  No orders of the defined types were placed in this encounter.  No orders of the defined types were placed in this encounter.    Procedures: No procedures performed  Clinical Data: No additional findings.  ROS:  All other systems negative, except as noted in the HPI. Review of Systems  Objective: Vital Signs: There were no vitals taken for this visit.  Specialty Comments:  No specialty comments available.  PMFS History: Patient Active Problem List   Diagnosis Date Noted  . Osteomyelitis ([place]) 01/16/2018  . Necrotic toes ([place]) 01/16/2018  . 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 Anemia of chronic disease 12/05/2017  . AKI (acute kidney  injury) ([place]) 12/05/2017  . Rhabdomyolysis 12/05/2017  . Elevated liver enzymes 12/05/2017  . Protein-calorie malnutrition, severe 12/05/2017  . Sepsis ([place]) 12/04/2017  . Loss of weight 03/21/2017  . Chronic diarrhea 03/21/2017  . Ulcerative colitis with complication ([place]) 52/71/2929  . Tobacco use disorder 02/12/2016  . Schizoaffective disorder, bipolar type ([place]) 02/11/2016  . Chronic tachycardia 01/13/2015  . 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 Anemia   . Anxiety   .  Osteomyelitis ([place]) 01/2018   RIGHT FOOT  . Schizophrenia ([place])   . Ulcerative colitis ([place])     Family History  Problem Relation Age of Onset  . Colon cancer Father 45  . Stomach cancer Neg Hx     Past Surgical History:  Procedure Laterality Date  . AMPUTATION Right 01/18/2018   Procedure: AMPUTATION OF TOES;  Surgeon: Newt Minion, MD;  Location: [facility];  Service: Orthopedics;  Laterality: Right;  . SMALL INTESTINE SURGERY  2016   in [place]  . TOTAL COLECTOMY  2003   Social History   Occupational History  . Not on file  Tobacco Use  . Smoking status: Current Every Day Smoker    Packs/day: 0.25    Types: Cigarettes, Cigars  . Smokeless tobacco: Never Used  Substance and Sexual Activity  . Alcohol use: No    Alcohol/week: 0.0 oz  . Drug use: No  . Sexual activity: Not Currently

## 2018-03-04 ENCOUNTER — Telehealth (INDEPENDENT_AMBULATORY_CARE_PROVIDER_SITE_OTHER): Payer: Self-pay

## 2018-03-04 NOTE — Telephone Encounter (Signed)
I called and advised that an order was called to [facility] on 02/25/18. Order for silvadene was faxed to [facility]. Advised that I spoke with Lorriane Shire. Legrand Como advised tht he has her number and will call to set up appt time. I advised that if he does not hear anything back to please call me and I will reach out as well.

## 2018-03-04 NOTE — Telephone Encounter (Signed)
Christian Lamb called stating that [facility] hasn't came to see patient.  Stated that [facility] never received an order.  Would like a call back at [phone] or [phone].  If no answer, please leave a message and he will call right back.  Please advise.  Thank you.

## 2018-03-05 DIAGNOSIS — M869 Osteomyelitis, unspecified: Secondary | ICD-10-CM | POA: Insufficient documentation

## 2018-03-05 HISTORY — DX: Osteomyelitis, unspecified: M86.9

## 2018-03-09 ENCOUNTER — Ambulatory Visit (INDEPENDENT_AMBULATORY_CARE_PROVIDER_SITE_OTHER): Payer: Medicaid Other | Admitting: Orthopedic Surgery

## 2019-03-11 ENCOUNTER — Encounter: Payer: Self-pay | Admitting: *Deleted

## 2019-03-31 ENCOUNTER — Ambulatory Visit: Payer: Medicaid Other | Admitting: Gastroenterology

## 2019-04-28 ENCOUNTER — Ambulatory Visit (INDEPENDENT_AMBULATORY_CARE_PROVIDER_SITE_OTHER): Payer: Medicaid Other | Admitting: Gastroenterology

## 2019-04-28 ENCOUNTER — Encounter: Payer: Self-pay | Admitting: Gastroenterology

## 2019-04-28 ENCOUNTER — Other Ambulatory Visit: Payer: Self-pay

## 2019-04-28 VITALS — BP 120/78 | HR 102 | Temp 98.4°F | Resp 18 | Ht 71.0 in | Wt 117.4 lb

## 2019-04-28 DIAGNOSIS — K529 Noninfective gastroenteritis and colitis, unspecified: Secondary | ICD-10-CM | POA: Diagnosis not present

## 2019-04-28 DIAGNOSIS — R634 Abnormal weight loss: Secondary | ICD-10-CM

## 2019-04-28 DIAGNOSIS — K91850 Pouchitis: Secondary | ICD-10-CM | POA: Diagnosis not present

## 2019-04-28 MED ORDER — CIPROFLOXACIN HCL 500 MG PO TABS: 500.0000 mg | ORAL_TABLET | Freq: Two times a day (BID) | ORAL | 1 refills | Status: AC

## 2019-04-28 MED ORDER — CIPROFLOXACIN HCL 500 MG PO TABS: 500.0000 mg | ORAL_TABLET | Freq: Two times a day (BID) | ORAL | 0 refills | Status: DC

## 2019-04-28 MED ORDER — ENSURE ENLIVE PO LIQD: 237.0000 mL | Freq: Two times a day (BID) | ORAL | Status: AC

## 2019-04-28 MED ORDER — METRONIDAZOLE 500 MG PO TABS: 500.0000 mg | ORAL_TABLET | Freq: Two times a day (BID) | ORAL | 0 refills | Status: DC

## 2019-04-28 MED ORDER — METRONIDAZOLE 500 MG PO TABS: 500.0000 mg | ORAL_TABLET | Freq: Two times a day (BID) | ORAL | 1 refills | Status: AC

## 2019-04-28 NOTE — Progress Notes (Signed)
[name], MD [address]  [place]  [place], [place] [address]  Main: [phone]  Fax: [phone]    Gastroenterology Consultation  Referring Provider:     Marcelino Duster, MD Primary Care Physician:  Renata Caprice, DO Primary Gastroenterologist:  Dr. Concord Cellar Reason for Consultation:     Unintentional weight loss, chronic diarrhea        HPI:   Christian Lamb is a 43 y.o. male referred by Dr. Renata Caprice, DO  for consultation & management of unintentional weight loss, chronic diarrhea.  Patient has history of chronic tobacco use, history of ulcerative colitis, diagnosed in 01/2000 at [facility] after colonoscopy, underwent total proctocolectomy on 12/14/2001, with ileal pouch anal anastomosis by Dr. Harlon Ditty on 02/08/2002.  Patient does not remember what medical treatment he received at the time of initial diagnosis.  Patient is accompanied by his uncle who is his healthcare power of attorney.  Patient was seen by [facility] gastroenterology in 03/2017 for chronic diarrhea and weight loss.  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  He was started on budesonide and patient reports that he symptomatically felt better.  After that, he was lost to follow-up.  He was admitted to [facility] in 12/2017 secondary to sepsis for which he was treated.  He also received budesonide, cholestyramine and Lomotil at that time, he has not seen GI since then.  He lost about 15 pounds in last 1 year, has abdominal cramps, continues to have nonbloody diarrhea associated with incontinence.  He developed severe failure to thrive.  His uncle who is his healthcare power of attorney reports that he smokes, sleeps all day and does not eat at all.  He is currently not on budesonide.  Patient currently lives with his uncle  Labs from 1 year ago revealed mild anemia, hemoglobin 11.9, albumin 2.  His HIV is nonreactive, hepatitis  panel negative for B and C.  His stool studies were negative for C. difficile and other GI pathogens in 2018 and 2019  NSAIDs: None  Antiplts/Anticoagulants/Anti thrombotics: None  GI Procedures:  Colonoscopy 01/28/2000 at [facility] Diagnosis: A: Colon, right, biopsy. - Moderate active colitis, negative for dysplasia.  B: Colon, left, biopsy. - Moderate active colitis, negative for dysplasia.  Diagnosis: Colon, total colectomy. - Moderate to severe chronic active colitis consistent with ulcerative colitis involving essentially the entire colon (see comment). - Proximal (ileal) margin with mild acute serositis. - Distal (anal) margin with mild chronic active colitis (lymphocytic proctitis). - Appendix with acute appendicitis consistent with involvement by ulcerative colitis. - Four lymph nodes with lymphoid hyperplasia, no evidence of malignancy (0/4). - Acute serositis, mild. - No evidence of dysplasia or malignancy.  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 Anemia   . Anxiety   . Osteomyelitis ([place]) 01/2018   RIGHT FOOT  . Schizophrenia ([place])   . Ulcerative colitis [facility])     Past Surgical History:  Procedure Laterality  Date  . AMPUTATION Right 01/18/2018   Procedure: AMPUTATION OF TOES;  Surgeon: Newt Minion, MD;  Location: [place];  Service: Orthopedics;  Laterality: Right;  . SMALL INTESTINE SURGERY  2016   in  [place]  . TOTAL COLECTOMY  2003    Current Outpatient Medications:  .  acetaminophen (TYLENOL) 500 MG tablet, Take by mouth., Disp: , Rfl:  .  atenolol (TENORMIN) 25 MG tablet, Take 0.5 tablets (12.5 mg total) by mouth daily., Disp: 30 tablet, Rfl: 1 .  paliperidone (INVEGA SUSTENNA) 156 MG/ML SUSP injection, Inject 1 mL (156 mg total) into the muscle once., Disp: 0.9 mL, Rfl: 0 .  paliperidone (INVEGA) 6 MG 24 hr tablet, Take 2 tablets (12 mg total) by mouth at bedtime., Disp: 60 tablet, Rfl: 0 .  b complex vitamins tablet, Take 1 tablet by mouth daily. (Patient not taking: Reported on 04/28/2019), Disp: 30 tablet, Rfl: 1 .  benztropine (COGENTIN) 0.5 MG tablet, Take 1 tablet (0.5 mg total) by mouth Nightly. (Patient not taking: Reported on 04/28/2019), Disp: 30 tablet, Rfl: 2 .  budesonide (ENTOCORT EC) 3 MG 24 hr capsule, Take 3 capsules (9 mg total) by mouth daily. (Patient not taking: Reported on 04/28/2019), Disp: 90 capsule, Rfl: 3 .  chlorproMAZINE (THORAZINE) 25 MG tablet, Take 1 tablet (25 mg total) by mouth 3 (three) times daily as needed for hiccoughs. (Patient not taking: Reported on 04/28/2019), Disp: 30 tablet, Rfl: 1 .  ciprofloxacin (CIPRO) 500 MG tablet, Take 1 tablet (500 mg total) by mouth 2 (two) times daily for 14 days., Disp: 28 tablet, Rfl: 1 .  diphenoxylate-atropine (LOMOTIL) 2.5-0.025 MG tablet, Take 1 tablet by mouth 4 (four) times daily. (Patient not taking: Reported on 04/28/2019), Disp: 120 tablet, Rfl: 2 .  feeding supplement, ENSURE ENLIVE, (ENSURE ENLIVE) LIQD, Take 237 mLs by mouth 2 (two) times daily between meals. (Patient not taking: Reported on 04/28/2019), Disp: 60 Bottle, Rfl: 3 .  folic acid (FOLVITE) 1 MG tablet, Take 1 tablet (1 mg total) by mouth daily. (Patient not taking: Reported on 04/28/2019), Disp: 30 tablet, Rfl: 3 .  Hydrocortisone (GERHARDT'S BUTT CREAM) CREA, Apply 1 application topically 2 (two) times daily. (Patient not taking: Reported on  04/28/2019), Disp: 1 each, Rfl: 2 .  ipratropium-albuterol (DUONEB) 0.5-2.5 (3) MG/3ML SOLN, Take 3 mLs by nebulization every 4 (four) hours as needed. (Patient not taking: Reported on 04/28/2019), Disp: 360 mL, Rfl: 1 .  loperamide (IMODIUM) 2 MG capsule, Take 1 capsule (2 mg total) by mouth 3 (three) times daily as needed for diarrhea or loose stools. For diarrhea (Patient not taking: Reported on 04/28/2019), Disp: 90 capsule, Rfl: 2 .  mesalamine (ROWASA) 4 g enema, Place 60 mLs (4 g total) rectally at bedtime. (Patient not taking: Reported on 04/28/2019), Disp: 30 Bottle, Rfl: 2 .  metroNIDAZOLE (FLAGYL) 500 MG tablet, Take 1 tablet (500 mg total) by mouth 2 (two) times daily for 14 days., Disp: 28 tablet, Rfl: 1 .  multivitamin (PROSIGHT) TABS tablet, Take 1 tablet by mouth daily. (Patient not taking: Reported on 04/28/2019), Disp: 30 each, Rfl: 2 .  nicotine (NICODERM CQ - DOSED IN MG/24 HOURS) 21 mg/24hr patch, Place 1 patch (21 mg total) onto the skin daily. (Patient not taking: Reported on 04/28/2019), Disp: 28 patch, Rfl: 1 .  ondansetron (ZOFRAN) 4 MG tablet, Take 1 tablet (4 mg total) by mouth every 6 (six) hours as needed for nausea. (Patient not taking: Reported on 04/28/2019), Disp:  20 tablet, Rfl: 2 .  silver sulfADIAZINE (SILVADENE) 1 % cream, Apply 1 application topically daily. (Patient not taking: Reported on 04/28/2019), Disp: 400 g, Rfl: 0 .  sodium bicarbonate 650 MG tablet, Take 1 tablet (650 mg total) by mouth daily. (Patient not taking: Reported on 04/28/2019), Disp: 30 tablet, Rfl: 2 .  traZODone (DESYREL) 50 MG tablet, Take 1 tablet (50 mg total) by mouth at bedtime as needed for sleep. (Patient not taking: Reported on 04/28/2019), Disp: 30 tablet, Rfl: 1  Current Facility-Administered Medications:  .  [START ON 04/29/2019] feeding supplement (ENSURE ENLIVE) (ENSURE ENLIVE) liquid 237 mL, 237 mL, Oral, BID BM, Kenshawn Maciolek, Tally Due, MD    Family History  Problem Relation Age of Onset   . Colon cancer Father 31  . Stomach cancer Neg Hx      Social History   Tobacco Use  . Smoking status: Current Every Day Smoker    Packs/day: 0.25    Types: Cigarettes, Cigars  . Smokeless tobacco: Never Used  Substance Use Topics  . Alcohol use: No    Alcohol/week: 0.0 standard drinks  . Drug use: No    Allergies as of 04/28/2019  . (No Known Allergies)    Review of Systems:    All systems reviewed and negative except where noted in HPI.   Physical Exam:  BP 120/78 (BP Location: Left Arm, Patient Position: Sitting, Cuff Size: Normal)   Pulse (!) 102   Temp 98.4 F (36.9 C)   Resp 18   Ht 5' 11"  (1.803 m)   Wt 117 lb 6.4 oz (53.3 kg)   BMI 16.37 kg/m  No LMP for male patient.  General:   Alert, thin built, cachectic and cooperative in NAD Head:  Normocephalic and atraumatic, bitemporal wasting. Eyes:  Sclera clear, no icterus.   Conjunctiva pink. Ears:  Normal auditory acuity. Nose:  No deformity, discharge, or lesions. Mouth:  No deformity or lesions,oropharynx pink & moist. Neck:  Supple; no masses or thyromegaly. Lungs:  Respirations even and unlabored.  Clear throughout to auscultation.   No wheezes, crackles, or rhonchi. No acute distress. Heart:  Regular rate and rhythm; no murmurs, clicks, rubs, or gallops. Abdomen:  Normal bowel sounds. Soft, non-tender and distended, tympanic, old scar from previous surgery without masses, hepatosplenomegaly or hernias noted.  No guarding or rebound tenderness.   Rectal: Not performed Msk:  Symmetrical without gross deformities. Good, equal movement & strength bilaterally. Pulses:  Normal pulses noted. Extremities:  No clubbing or edema.  No cyanosis. Neurologic:  Alert and oriented x3;  grossly normal neurologically. Skin:  Intact without significant lesions or rashes. No jaundice. Psych:  Alert and cooperative. Normal mood and affect.  Imaging Studies: Reviewed  Assessment and Plan:   Joseangel Nettleton is a 43  y.o. African-American male with chronic tobacco use, history of ulcerative colitis, status post total proctocolectomy and ileal pouch anal anastomosis in 2003 at [facility] seen in consultation for unintentional weight loss, failure to thrive and chronic diarrhea.  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 Difficile Advised him to stop smoking tobacco   Follow up in 2 to 3 weeks   [name], MD

## 2019-04-30 LAB — COMPREHENSIVE METABOLIC PANEL
ALT: 12 IU/L (ref 0–44)
AST: 15 IU/L (ref 0–40)
Albumin/Globulin Ratio: 0.8 — ABNORMAL LOW (ref 1.2–2.2)
Albumin: 3.7 g/dL — ABNORMAL LOW (ref 4.0–5.0)
Alkaline Phosphatase: 84 IU/L (ref 39–117)
BUN/Creatinine Ratio: 13 (ref 9–20)
BUN: 12 mg/dL (ref 6–24)
Bilirubin Total: 0.4 mg/dL (ref 0.0–1.2)
CO2: 25 mmol/L (ref 20–29)
Calcium: 9.3 mg/dL (ref 8.7–10.2)
Chloride: 102 mmol/L (ref 96–106)
Creatinine, Ser: 0.93 mg/dL (ref 0.76–1.27)
GFR calc Af Amer: 116 mL/min/{1.73_m2} (ref >59)
GFR calc non Af Amer: 100 mL/min/{1.73_m2} (ref >59)
Globulin, Total: 4.6 g/dL — ABNORMAL HIGH (ref 1.5–4.5)
Glucose: 77 mg/dL (ref 65–99)
Potassium: 4.4 mmol/L (ref 3.5–5.2)
Sodium: 142 mmol/L (ref 134–144)
Total Protein: 8.3 g/dL (ref 6.0–8.5)

## 2019-04-30 LAB — B12 AND FOLATE PANEL
Folate: >20 ng/mL (ref >3.0)
Vitamin B-12: 1422 pg/mL — ABNORMAL HIGH (ref 232–1245)

## 2019-04-30 LAB — C-REACTIVE PROTEIN: CRP: 5 mg/L (ref 0–10)

## 2019-04-30 LAB — COPPER, SERUM: Copper: 113 ug/dL (ref 72–166)

## 2019-04-30 LAB — IRON AND TIBC
Iron Saturation: 22 % (ref 15–55)
Iron: 48 ug/dL (ref 38–169)
Total Iron Binding Capacity: 219 ug/dL — ABNORMAL LOW (ref 250–450)
UIBC: 171 ug/dL (ref 111–343)

## 2019-04-30 LAB — CBC
Hematocrit: 42.5 % (ref 37.5–51.0)
Hemoglobin: 14.2 g/dL (ref 13.0–17.7)
MCH: 29.8 pg (ref 26.6–33.0)
MCHC: 33.4 g/dL (ref 31.5–35.7)
MCV: 89 fL (ref 79–97)
Platelets: 254 10*3/uL (ref 150–450)
RBC: 4.76 x10E6/uL (ref 4.14–5.80)
RDW: 11.6 % (ref 11.6–15.4)
WBC: 6.9 10*3/uL (ref 3.4–10.8)

## 2019-04-30 LAB — MAGNESIUM: Magnesium: 2.2 mg/dL (ref 1.6–2.3)

## 2019-04-30 LAB — PHOSPHORUS: Phosphorus: 4 mg/dL (ref 2.8–4.1)

## 2019-04-30 LAB — FERRITIN: Ferritin: 123 ng/mL (ref 30–400)

## 2019-04-30 LAB — PREALBUMIN: PREALBUMIN: 20 mg/dL (ref 12–34)

## 2019-05-03 ENCOUNTER — Other Ambulatory Visit
Admission: RE | Admit: 2019-05-03 | Discharge: 2019-05-03 | Disposition: A | Discharge status: Home / Self Care | Payer: Medicaid Other | Source: Ambulatory Visit | Attending: Gastroenterology | Admitting: Gastroenterology

## 2019-05-03 ENCOUNTER — Other Ambulatory Visit: Payer: Self-pay

## 2019-05-03 DIAGNOSIS — Z20828 Contact with and (suspected) exposure to other viral communicable diseases: Secondary | ICD-10-CM | POA: Diagnosis not present

## 2019-05-03 LAB — SARS CORONAVIRUS 2 (TAT 6-24 HRS): SARS Coronavirus 2: NEGATIVE

## 2019-05-05 ENCOUNTER — Encounter: Payer: Self-pay | Admitting: *Deleted

## 2019-05-06 ENCOUNTER — Ambulatory Visit: Payer: Medicaid Other | Admitting: Certified Registered Nurse Anesthetist

## 2019-05-06 ENCOUNTER — Ambulatory Visit
Admission: RE | Admit: 2019-05-06 | Discharge: 2019-05-06 | Disposition: A | Discharge status: Home / Self Care | Payer: Medicaid Other | Source: Ambulatory Visit | Attending: Gastroenterology | Admitting: Gastroenterology

## 2019-05-06 ENCOUNTER — Encounter
Admission: RE | Disposition: A | Discharge status: Home / Self Care | Payer: Self-pay | Source: Ambulatory Visit | Attending: Gastroenterology

## 2019-05-06 ENCOUNTER — Encounter: Payer: Self-pay | Admitting: *Deleted

## 2019-05-06 ENCOUNTER — Other Ambulatory Visit: Payer: Self-pay

## 2019-05-06 DIAGNOSIS — Z9049 Acquired absence of other specified parts of digestive tract: Secondary | ICD-10-CM | POA: Insufficient documentation

## 2019-05-06 DIAGNOSIS — Z980 Intestinal bypass and anastomosis status: Secondary | ICD-10-CM | POA: Insufficient documentation

## 2019-05-06 DIAGNOSIS — F17210 Nicotine dependence, cigarettes, uncomplicated: Secondary | ICD-10-CM | POA: Diagnosis not present

## 2019-05-06 DIAGNOSIS — K91850 Pouchitis: Secondary | ICD-10-CM | POA: Diagnosis present

## 2019-05-06 DIAGNOSIS — Z89421 Acquired absence of other right toe(s): Secondary | ICD-10-CM | POA: Insufficient documentation

## 2019-05-06 DIAGNOSIS — I10 Essential (primary) hypertension: Secondary | ICD-10-CM | POA: Diagnosis not present

## 2019-05-06 DIAGNOSIS — K529 Noninfective gastroenteritis and colitis, unspecified: Secondary | ICD-10-CM | POA: Diagnosis not present

## 2019-05-06 DIAGNOSIS — F209 Schizophrenia, unspecified: Secondary | ICD-10-CM | POA: Diagnosis not present

## 2019-05-06 DIAGNOSIS — R634 Abnormal weight loss: Secondary | ICD-10-CM

## 2019-05-06 HISTORY — PX: POUCHOSCOPY: SHX6321

## 2019-05-06 SURGERY — ENDOSCOPY, POUCH, SMALL INTESTINE, DIAGNOSTIC
Anesthesia: General

## 2019-05-06 MED ORDER — PROPOFOL 500 MG/50ML IV EMUL
INTRAVENOUS | Status: DC | PRN
  Administered 2019-05-06: 140 ug/kg/min via INTRAVENOUS

## 2019-05-06 MED ORDER — SODIUM CHLORIDE 0.9 % IV SOLN
INTRAVENOUS | Status: DC
  Administered 2019-05-06: 11:00:00 via INTRAVENOUS

## 2019-05-06 MED ORDER — PROPOFOL 10 MG/ML IV BOLUS
INTRAVENOUS | Status: DC | PRN
  Administered 2019-05-06: 50 mg via INTRAVENOUS
  Administered 2019-05-06: 10 mg via INTRAVENOUS
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
## 2019-05-06 NOTE — Anesthesia Preprocedure Evaluation (Addendum)
Anesthesia Evaluation  Patient identified by MRN, date of birth, ID band Patient awake    Reviewed: Allergy & Precautions, NPO status , Patient's Chart, lab work & pertinent test results  History of Anesthesia Complications Negative for: history of anesthetic complications  Airway Mallampati: II       Dental   Pulmonary neg sleep apnea, neg COPD, Current Smoker,           Cardiovascular hypertension, Pt. on medications and Pt. on home beta blockers (-) Past MI and (-) CHF (-) dysrhythmias (-) Valvular Problems/Murmurs     Neuro/Psych neg Seizures Anxiety Bipolar Disorder Schizophrenia    GI/Hepatic Neg liver ROS, PUD, neg GERD  ,  Endo/Other    Renal/GU negative Renal ROS     Musculoskeletal   Abdominal   Peds  Hematology  (+) anemia ,   Anesthesia Other Findings   Reproductive/Obstetrics                             Anesthesia Physical Anesthesia Plan  ASA: III  Anesthesia Plan: General   Post-op Pain Management:    Induction: Intravenous  PONV Risk Score and Plan: 1 and Propofol infusion and TIVA  Airway Management Planned: Nasal Cannula  Additional Equipment:   Intra-op Plan:   Post-operative Plan:   Informed Consent: I have reviewed the patients History and Physical, chart, labs and discussed the procedure including the risks, benefits and alternatives for the proposed anesthesia with the patient or authorized representative who has indicated his/her understanding and acceptance.       Plan Discussed with:   Anesthesia Plan Comments:         Anesthesia Quick Evaluation

## 2019-05-06 NOTE — Transfer of Care (Signed)
Immediate Anesthesia Transfer of Care Note  Patient: Christian Lamb  Procedure(s) Performed: POUCHOSCOPY (N/A )  Patient Location: PACU  Anesthesia Type:General  Level of Consciousness: drowsy  Airway & Oxygen Therapy: Patient Spontanous Breathing and Patient connected to nasal cannula oxygen  Post-op Assessment: Report given to RN and Post -op Vital signs reviewed and stable  Post vital signs: Reviewed and stable  Last Vitals:  Vitals Value Taken Time  BP 125/99 05/06/19 1113  Temp    Pulse 77 05/06/19 1113  Resp 27 05/06/19 1113  SpO2 98 % 05/06/19 1113  Vitals shown include unvalidated device data.  Last Pain:  Vitals:   05/06/19 1011  TempSrc: Tympanic  PainSc: 0-No pain         Complications: No apparent anesthesia complications

## 2019-05-06 NOTE — Progress Notes (Signed)
Per Education officer, museum, [name] can leave with [name] when ready to discharge.

## 2019-05-06 NOTE — Anesthesia Post-op Follow-up Note (Signed)
Anesthesia QCDR form completed.        

## 2019-05-06 NOTE — H&P (Signed)
[name], MD [address]  [place]  [place], [place] [address]  Main: [phone]  Fax: [phone] Pager: [phone]  Primary Care Physician:  [facility], [facility] Primary Gastroenterologist:  Dr. [name]  Pre-Procedure History & Physical: HPI:  Christian Lamb is a 43 y.o. male is here for an pouchoscopy.   Past Medical History:  Diagnosis Date  . Anemia   . Anxiety   . Osteomyelitis ([place]) 01/2018   RIGHT FOOT  . Schizophrenia ([place])   . Ulcerative colitis [facility])     Past Surgical History:  Procedure Laterality Date  . AMPUTATION Right 01/18/2018   Procedure: AMPUTATION OF TOES;  Surgeon: Newt Minion, MD;  Location: [place];  Service: Orthopedics;  Laterality: Right;  . SMALL INTESTINE SURGERY  2016   in [place]  . TOTAL COLECTOMY  2003    Prior to Admission medications   Medication Sig Start Date End Date Taking? Authorizing Provider  acetaminophen (TYLENOL) 500 MG tablet Take by mouth. 03/13/18  Yes [provider]  atenolol (TENORMIN) 25 MG tablet Take 0.5 tablets (12.5 mg total) by mouth daily. 12/19/17  Yes Emokpae, Courage, MD  paliperidone (INVEGA SUSTENNA) 156 MG/ML SUSP injection Inject 1 mL (156 mg total) into the muscle once. 02/20/16  Yes Hildred Priest, MD  b complex vitamins tablet Take 1 tablet by mouth daily. Patient not taking: Reported on 04/28/2019 12/18/17   Roxan Hockey, MD  benztropine (COGENTIN) 0.5 MG tablet Take 1 tablet (0.5 mg total) by mouth Nightly. Patient not taking: Reported on 04/28/2019 12/18/17   Roxan Hockey, MD  budesonide (ENTOCORT EC) 3 MG 24 hr capsule Take 3 capsules (9 mg total) by mouth daily. Patient not taking: Reported on 04/28/2019 12/19/17   Roxan Hockey, MD  chlorproMAZINE (THORAZINE) 25 MG tablet Take 1 tablet (25 mg total) by mouth 3 (three) times daily as needed for hiccoughs. Patient not taking: Reported on 04/28/2019 12/18/17   Roxan Hockey, MD   ciprofloxacin (CIPRO) 500 MG tablet Take 1 tablet (500 mg total) by mouth 2 (two) times daily for 14 days. 04/28/19 05/12/19  Lin Landsman, MD  diphenoxylate-atropine (LOMOTIL) 2.5-0.025 MG tablet Take 1 tablet by mouth 4 (four) times daily. Patient not taking: Reported on 04/28/2019 12/18/17   Roxan Hockey, MD  feeding supplement, ENSURE ENLIVE, (ENSURE ENLIVE) LIQD Take 237 mLs by mouth 2 (two) times daily between meals. Patient not taking: Reported on 04/28/2019 12/18/17   Roxan Hockey, MD  folic acid (FOLVITE) 1 MG tablet Take 1 tablet (1 mg total) by mouth daily. Patient not taking: Reported on 04/28/2019 12/19/17   Roxan Hockey, MD  Hydrocortisone (GERHARDT'S BUTT CREAM) CREA Apply 1 application topically 2 (two) times daily. Patient not taking: Reported on 04/28/2019 12/18/17   Roxan Hockey, MD  ipratropium-albuterol (DUONEB) 0.5-2.5 (3) MG/3ML SOLN Take 3 mLs by nebulization every 4 (four) hours as needed. Patient not taking: Reported on 04/28/2019 12/18/17   Roxan Hockey, MD  loperamide (IMODIUM) 2 MG capsule Take 1 capsule (2 mg total) by mouth 3 (three) times daily as needed for diarrhea or loose stools. For diarrhea Patient not taking: Reported on 04/28/2019 01/21/18   Velvet Bathe, MD  mesalamine (ROWASA) 4 g enema Place 60 mLs (4 g total) rectally at bedtime. Patient not taking: Reported on 04/28/2019 12/18/17   Roxan Hockey, MD  metroNIDAZOLE (FLAGYL) 500 MG tablet Take 1 tablet (500 mg total) by mouth 2 (two) times daily for 14 days. 04/28/19 05/12/19  Lin Landsman, MD  multivitamin (PROSIGHT) TABS tablet Take 1 tablet by mouth daily. Patient not taking: Reported on 04/28/2019 12/19/17   Roxan Hockey, MD  nicotine (NICODERM CQ - DOSED IN MG/24 HOURS) 21 mg/24hr patch Place 1 patch (21 mg total) onto the skin daily. Patient not taking: Reported on 04/28/2019 12/18/17   Roxan Hockey, MD  ondansetron (ZOFRAN) 4 MG tablet Take 1 tablet (4 mg total) by mouth  every 6 (six) hours as needed for nausea. Patient not taking: Reported on 04/28/2019 12/18/17   Roxan Hockey, MD  paliperidone (INVEGA) 6 MG 24 hr tablet Take 2 tablets (12 mg total) by mouth at bedtime. 12/18/17   Roxan Hockey, MD  silver sulfADIAZINE (SILVADENE) 1 % cream Apply 1 application topically daily. Patient not taking: Reported on 04/28/2019 02/25/18   Newt Minion, MD  sodium bicarbonate 650 MG tablet Take 1 tablet (650 mg total) by mouth daily. Patient not taking: Reported on 04/28/2019 12/19/17   Roxan Hockey, MD  traZODone (DESYREL) 50 MG tablet Take 1 tablet (50 mg total) by mouth at bedtime as needed for sleep. Patient not taking: Reported on 04/28/2019 12/18/17   Roxan Hockey, MD    Allergies as of 04/29/2019  . (No Known Allergies)    Family History  Problem Relation Age of Onset  . Colon cancer Father 66  . Stomach cancer Neg Hx     Social History   Socioeconomic History  . Marital status: Single    Spouse name: Not on file  . Number of children: Not on file  . Years of education: Not on file  . Highest education level: Not on file  Occupational History  . Not on file  Social Needs  . Financial resource strain: Not on file  . Food insecurity    Worry: Not on file    Inability: Not on file  . Transportation needs    Medical: Not on file    Non-medical: Not on file  Tobacco Use  . Smoking status: Current Every Day Smoker    Packs/day: 0.25    Types: Cigarettes, Cigars  . Smokeless tobacco: Never Used  Substance and Sexual Activity  . Alcohol use: No    Alcohol/week: 0.0 standard drinks  . Drug use: No  . Sexual activity: Not Currently  Lifestyle  . Physical activity    Days per week: Not on file    Minutes per session: Not on file  . Stress: Not on file  Relationships  . Social Herbalist on phone: Not on file    Gets together: Not on file    Attends religious service: Not on file    Active member of club or organization:  Not on file    Attends meetings of clubs or organizations: Not on file    Relationship status: Not on file  . Intimate partner violence    Fear of current or ex partner: Not on file    Emotionally abused: Not on file    Physically abused: Not on file    Forced sexual activity: Not on file  Other Topics Concern  . Not on file  Social History Narrative  . Not on file    Review of Systems: See HPI, otherwise negative ROS  Physical Exam: BP 97/79   Pulse 95   Temp 98 F (36.7 C) (Tympanic)   Resp 18   Ht 5' 11"  (1.803 m)   Wt 53.1 kg   SpO2 99%  BMI 16.32 kg/m  General:   Alert,  pleasant and cooperative in NAD Head:  Normocephalic and atraumatic. Neck:  Supple; no masses or thyromegaly. Lungs:  Clear throughout to auscultation.    Heart:  Regular rate and rhythm. Abdomen:  Soft, nontender and nondistended. Normal bowel sounds, without guarding, and without rebound.   Neurologic:  Alert and  oriented x4;  grossly normal neurologically.  Impression/Plan: Christian Lamb is here for an pouchoscopy to be performed for flare up of pouchitis  Risks, benefits, limitations, and alternatives regarding  pouchoscopy have been reviewed with the patient.  Questions have been answered.  All parties agreeable.   Sherri Sear, MD  05/06/2019, 10:30 AM

## 2019-05-06 NOTE — Anesthesia Procedure Notes (Signed)
Performed by: Demetrius Charity, CRNA Pre-anesthesia Checklist: Patient identified, Emergency Drugs available, Suction available, Patient being monitored and Timeout performed Patient Re-evaluated:Patient Re-evaluated prior to induction Oxygen Delivery Method: Nasal cannula Induction Type: IV induction

## 2019-05-06 NOTE — Op Note (Signed)
[facility] Gastroenterology Patient Name: Christian Lamb Procedure Date: 05/06/2019 10:51 AM MRN: [id number] Account #: [id number] Date of Birth: Sep 11, 1976 Admit Type: Outpatient Age: 43 Room: [facility] ENDO ROOM 4 Gender: Male Note Status: Finalized Procedure:            Pouchoscopy Indications:          History of total proctocolectomy, , Chronic diarrhea,                        s/p IPAA Providers:            Lin Landsman MD, MD Referring MD:         No Local Md, MD (Referring MD) Medicines:            Monitored Anesthesia Care Complications:        No immediate complications. Estimated blood loss:                        Minimal. Procedure:            Pre-Anesthesia Assessment:                       - Prior to the procedure, a History and Physical was                        performed, and patient medications and allergies were                        reviewed. The patient is competent. The risks and                        benefits of the procedure and the sedation options and                        risks were discussed with the patient. All questions                        were answered and informed consent was obtained.                        Patient identification and proposed procedure were                        verified by the physician, the nurse, the                        anesthesiologist, the anesthetist and the technician in                        the pre-procedure area in the procedure room in the                        endoscopy suite. Mental Status Examination: alert and                        oriented. Airway Examination: normal oropharyngeal                        airway and neck mobility. Respiratory Examination:  clear to auscultation. CV Examination: normal.                        Prophylactic Antibiotics: The patient does not require                        prophylactic antibiotics. Prior Anticoagulants: The                 patient has taken no previous anticoagulant or                        antiplatelet agents. ASA Grade Assessment: III - A                        patient with severe systemic disease. After reviewing                        the risks and benefits, the patient was deemed in                        satisfactory condition to undergo the procedure. The                        anesthesia plan was to use monitored anesthesia care                        (MAC). Immediately prior to administration of                        medications, the patient was re-assessed for adequacy                        to receive sedatives. The heart rate, respiratory rate,                        oxygen saturations, blood pressure, adequacy of                        pulmonary ventilation, and response to care were                        monitored throughout the procedure. The physical status                        of the patient was re-assessed after the procedure.                       After obtaining informed consent, the endoscope was                        passed under direct vision. Throughout the procedure,                        the patient's blood pressure, pulse, and oxygen                        saturations were monitored continuously. The Endoscope                        was introduced through the ileoanal anastomosis via the  anus and advanced to the ileoanal pouch and into the                        neo-terminal ileum. The procedure was performed without                        difficulty. The patient tolerated the procedure fairly                        well. The quality of the bowel preparation was adequate. Findings:      The perianal and digital rectal examinations were normal. Pertinent       negatives include normal sphincter tone and no palpable rectal lesions.      Diffuse inflammation, moderate in severity and characterized by       congestion (edema), erosions,  friability, loss of vascularity and       shallow ulcerations was found in the distal pre-pouch ileum. Biopsies       were taken with a cold forceps for histology.      Inflammation characterized by congestion (edema), erosions, friability,       loss of vascularity and deep ulcerations was found in the ileoanal       pouch. This was graded as Pouchitis Disease Activity Index (Endoscopic)       Score 5. Biopsies were taken with a cold forceps for histology. Impression:           - Inflammation was found in the pre-pouch ileum, rule                        out Crohn's disease. Biopsied.                       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 Biopsied. Recommendation:       - Discharge patient to home (with escort).                       - Resume regular diet today.                       - Await pathology results.                       - Return to my office as previously scheduled. Procedure Code(s):    --- Professional ---                       [phone], Endoscopic evaluation of small intestinal pouch                        (eg, Kock pouch, ileal reservoir [S or J]); with                        biopsy, single or multiple Diagnosis Code(s):    --- Professional ---                       C14.481, Pouchitis  Z90.49, Acquired absence of other specified parts of                        digestive tract                       K52.9, Noninfective gastroenteritis and colitis,                        unspecified CPT copyright 2019 American Medical Association. All rights reserved. The codes documented in this report are preliminary and upon coder review may  be revised to meet current compliance requirements. Dr. Ulyess Mort Lin Landsman MD, MD 05/06/2019 11:16:59 AM This report has been signed electronically. Number of Addenda: 0 Note Initiated On: 05/06/2019 10:51 AM Total Procedure Duration: 0 hours 10 minutes  55 seconds       [facility]

## 2019-05-07 ENCOUNTER — Encounter: Payer: Self-pay | Admitting: Gastroenterology

## 2019-05-07 DIAGNOSIS — K529 Noninfective gastroenteritis and colitis, unspecified: Secondary | ICD-10-CM | POA: Insufficient documentation

## 2019-05-07 DIAGNOSIS — F4320 Adjustment disorder, unspecified: Secondary | ICD-10-CM | POA: Insufficient documentation

## 2019-05-07 NOTE — Anesthesia Postprocedure Evaluation (Signed)
Anesthesia Post Note  Patient: Christian Lamb  Procedure(s) Performed: POUCHOSCOPY (N/A )  Patient location during evaluation: Endoscopy Anesthesia Type: General Level of consciousness: awake and alert and oriented Pain management: pain level controlled Vital Signs Assessment: post-procedure vital signs reviewed and stable Respiratory status: spontaneous breathing Anesthetic complications: no     Last Vitals:  Vitals:   05/06/19 1110 05/06/19 1120  BP: (!) 125/99 96/76  Pulse: 77 72  Resp: 18 (!) 21  Temp: 36.7 C   SpO2: 98% 100%    Last Pain:  Vitals:   05/06/19 1110  TempSrc: Tympanic  PainSc:                  Christian Lamb

## 2019-05-11 LAB — SURGICAL PATHOLOGY

## 2019-05-19 ENCOUNTER — Ambulatory Visit: Payer: Medicaid Other | Admitting: Gastroenterology

## 2019-05-20 ENCOUNTER — Ambulatory Visit: Payer: Medicaid Other | Admitting: Gastroenterology

## 2019-05-20 ENCOUNTER — Encounter: Payer: Self-pay | Admitting: Gastroenterology

## 2019-06-01 ENCOUNTER — Encounter (INDEPENDENT_AMBULATORY_CARE_PROVIDER_SITE_OTHER): Payer: Self-pay

## 2019-06-01 ENCOUNTER — Ambulatory Visit (INDEPENDENT_AMBULATORY_CARE_PROVIDER_SITE_OTHER): Payer: Medicaid Other | Admitting: Gastroenterology

## 2019-06-01 ENCOUNTER — Other Ambulatory Visit: Payer: Self-pay

## 2019-06-01 ENCOUNTER — Encounter: Payer: Self-pay | Admitting: Gastroenterology

## 2019-06-01 VITALS — BP 119/73 | HR 134 | Temp 98.3°F | Wt 132.6 lb

## 2019-06-01 DIAGNOSIS — K529 Noninfective gastroenteritis and colitis, unspecified: Secondary | ICD-10-CM

## 2019-06-01 DIAGNOSIS — K91850 Pouchitis: Secondary | ICD-10-CM

## 2019-06-01 MED ORDER — CIPROFLOXACIN HCL 500 MG PO TABS: 500.0000 mg | ORAL_TABLET | Freq: Two times a day (BID) | ORAL | 1 refills | Status: AC

## 2019-06-01 MED ORDER — METRONIDAZOLE 500 MG PO TABS: 500.0000 mg | ORAL_TABLET | Freq: Two times a day (BID) | ORAL | 1 refills | Status: AC

## 2019-06-01 NOTE — Progress Notes (Signed)
Christian Darby, MD [address]  [place]  [place], [place] [address]  Main: [phone]  Fax: [phone]    Gastroenterology Consultation  Referring Provider:     Renata Caprice, DO Primary Care Physician:  [facility] Primary Gastroenterologist:  Dr. McNair Lamb Reason for Consultation:     Unintentional weight loss, chronic diarrhea        HPI:   Christian Lamb is a 43 y.o. male referred by Dr. Alison Lamb, [facility]  for consultation & management of unintentional weight loss, chronic diarrhea.  Patient has history of chronic tobacco use, history of ulcerative colitis, diagnosed in 01/2000 at [facility] after colonoscopy, underwent total proctocolectomy on 12/14/2001, with ileal pouch anal anastomosis by Dr. Harlon Lamb on 02/08/2002.  Patient does not remember what medical treatment he received at the time of initial diagnosis.  Patient is accompanied by his uncle who is his healthcare power of attorney.  Patient was seen by [facility] gastroenterology in 03/2017 for chronic diarrhea and weight loss.  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  He was started on budesonide and patient reports that he symptomatically felt better.  After that, he was lost to follow-up.  He was admitted to [facility] in 12/2017 secondary to sepsis for which he was treated.  He also received budesonide, cholestyramine and Lomotil at that time, he has not seen GI since then.  He lost about 15 pounds in last 1 year, has abdominal cramps, continues to have nonbloody diarrhea associated with incontinence.  He developed severe failure to thrive.  His uncle who is his healthcare power of attorney reports that he smokes, sleeps all day and does not eat at all.  He is currently not on budesonide.  Patient currently lives with his uncle  Labs from 1 year ago revealed mild anemia, hemoglobin 11.9, albumin 2.  His HIV is  nonreactive, hepatitis panel negative for B and C.  His stool studies were negative for C. difficile and other GI pathogens in 2018 and 2019  Follow-up visit 06/01/2019 Patient underwent pouchoscopy which revealed severe inflammation in the neoterminal ileum as well as pouch.  He reports that the antibiotic ciprofloxacin and metronidazole have been working well.  He ran out of ciprofloxacin, currently on metronidazole only, his diarrhea has significantly improved.  He reports having 2 watery, nonbloody bowel movements daily.  He denies abdominal pain.  He gained weight by 10 pounds.  He was admitted to [facility] psychiatry on 05/07/2019.  He started on mirtazapine which also helps with his appetite.  NSAIDs: None  Antiplts/Anticoagulants/Anti thrombotics: None  GI Procedures:   Pouchoscopy 05/06/2019 Diffuse inflammation, moderate in severity and characterized by congestion (edema), erosions, friability, loss of vascularity and shallow ulcerations was found in the distal pre-pouch ileum. Biopsies were taken with a cold forceps for histology. Inflammation characterized by congestion (edema), erosions, friability, loss of vascularity and deep ulcerations was found in the ileoanal pouch. This was graded as Pouchitis Disease Activity Index (Endoscopic) Score 5. Biopsies were taken with a cold forceps for histology.  Surgical Pathology  DIAGNOSIS:  A. NEOTERMINAL ILEUM; COLD BIOPSY:  - MILD ACTIVE ENTERITIS.  - TWO BIOPSY FRAGMENTS WITH MILD CHRONIC ACTIVE ENTERITIS.  - SEE COMMENT.  - NEGATIVE FOR VIRAL CYTOPATHIC EFFECT, DYSPLASIA, AND MALIGNANCY.   B. POUCH; COLD BIOPSY:  - MODERATE ACTIVE INFLAMMATION WITH CHRONIC MUCOSAL CHANGES, CONSISTENT  WITH CLINICAL IMPRESSION OF POUCHITIS.  -  NEGATIVE FOR DYSPLASIA AND MALIGNANCY.  - IHC FOR CMV WILL BE REPORTED IN AN ADDENDUM.   Comment:  Inflammation proximal to the pouch ("pre-pouch ileitis") is common in  patients with pouchitis. Since pouchitis  cannot be distinguished from  IBD histologically correlation with clinical impression is required. An immunohistochemical stain for CMV was performed on part B and is  negative.   Colonoscopy 01/28/2000 at [facility] Diagnosis: A: Colon, right, biopsy. - Moderate active colitis, negative for dysplasia.  B: Colon, left, biopsy. - Moderate active colitis, negative for dysplasia.  Diagnosis: Colon, total colectomy. - Moderate to severe chronic active colitis consistent with ulcerative colitis involving essentially the entire colon (see comment). - Proximal (ileal) margin with mild acute serositis. - Distal (anal) margin with mild chronic active colitis (lymphocytic proctitis). - Appendix with acute appendicitis consistent with involvement by ulcerative colitis. - Four lymph nodes with lymphoid hyperplasia, no evidence of malignancy (0/4). - Acute serositis, mild. - No evidence of dysplasia or malignancy.  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 Anemia   . Anxiety   . Osteomyelitis ([place]) 01/2018   RIGHT FOOT  . Schizophrenia ([place])   . Ulcerative colitis [facility])     Past Surgical History:  Procedure Laterality Date  . AMPUTATION Right 01/18/2018   Procedure: AMPUTATION OF TOES;  Surgeon: Christian Minion, MD;  Location: [place];  Service: Orthopedics;  Laterality: Right;  . POUCHOSCOPY N/A 05/06/2019   Procedure: POUCHOSCOPY;   Surgeon: Christian Landsman, MD;  Location: [facility] ENDOSCOPY;  Service: Gastroenterology;  Laterality: N/A;  . SMALL INTESTINE SURGERY  2016   in [place]  . TOTAL COLECTOMY  2003    Current Outpatient Medications:  .  atenolol (TENORMIN) 25 MG tablet, Take 0.5 tablets (12.5 mg total) by mouth daily., Disp: 30 tablet, Rfl: 1 .  Melatonin 3 MG TABS, Take by mouth., Disp: , Rfl:  .  metroNIDAZOLE (FLAGYL) 500 MG tablet, Take 1 tablet (500 mg total) by mouth 2 (two) times daily., Disp: 60 tablet, Rfl: 1 .  mirtazapine (REMERON SOL-TAB) 15 MG disintegrating tablet, Take 15 mg by mouth at bedtime., Disp: , Rfl:  .  nicotine polacrilex (NICORETTE) 2 MG gum, Place inside cheek., Disp: , Rfl:  .  ciprofloxacin (CIPRO) 500 MG tablet, Take 1 tablet (500 mg total) by mouth 2 (two) times daily., Disp: 60 tablet, Rfl: 1    Family History  Problem Relation Age of Onset  . Colon cancer Father 16  . Stomach cancer Neg Hx      Social History   Tobacco Use  . Smoking status: Current Every Day Smoker    Packs/day: 0.25    Types: Cigarettes, Cigars  . Smokeless tobacco: Never Used  Substance Use Topics  . Alcohol use: No    Alcohol/week: 0.0 standard drinks  . Drug use: No    Allergies as of 06/01/2019  . (No Known Allergies)    Review of Systems:    All systems reviewed and negative except where noted in  HPI.   Physical Exam:  BP 119/73   Pulse (!) 134   Temp 98.3 F (36.8 C) (Oral)   Wt 132 lb 9.6 oz (60.1 kg)   BMI 18.49 kg/m  No LMP for male patient.  General:   Alert, thin built, cachectic and cooperative in NAD Head:  Normocephalic and atraumatic, bitemporal wasting. Eyes:  Sclera clear, no icterus.   Conjunctiva pink. Ears:  Normal auditory acuity. Nose:  No deformity, discharge, or lesions. Mouth:  No deformity or lesions,oropharynx pink & moist. Neck:  Supple; no masses or thyromegaly. Lungs:  Respirations even and unlabored.  Clear throughout to auscultation.   No  wheezes, crackles, or rhonchi. No acute distress. Heart:  Regular rate and rhythm; no murmurs, clicks, rubs, or gallops. Abdomen:  Normal bowel sounds. Soft, non-tender and distended, tympanic, old scar from previous surgery without masses, hepatosplenomegaly or hernias noted.  No guarding or rebound tenderness.   Rectal: Not performed Msk:  Symmetrical without gross deformities. Good, equal movement & strength bilaterally. Pulses:  Normal pulses noted. Extremities:  No clubbing or edema.  No cyanosis. Neurologic:  Alert and oriented x3;  grossly normal neurologically. Skin:  Intact without significant lesions or rashes. No jaundice. Psych:  Alert and cooperative. Normal mood and affect.  Imaging Studies: Reviewed  Assessment and Plan:   Christian Lamb is a 43 y.o. 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  He also has pre-pouch ileitis.  Patient is currently in clinical remission on antibiotics.  Patient gained about 12 to 15 pounds since last visit  Recommend to continue Cipro and Flagyl 500 mg twice daily, long-term Continue mirtazapine  Follow up in 1 month  Christian Darby, MD

## 2019-06-11 IMAGING — CR DG CHEST 2V
3 series · 3 of 3 positions shown · non-contrast
Comparison: 08/04/2008.

CLINICAL DATA: Shortness of breath.

EXAM:
CHEST  2 VIEW

[w chest lat]
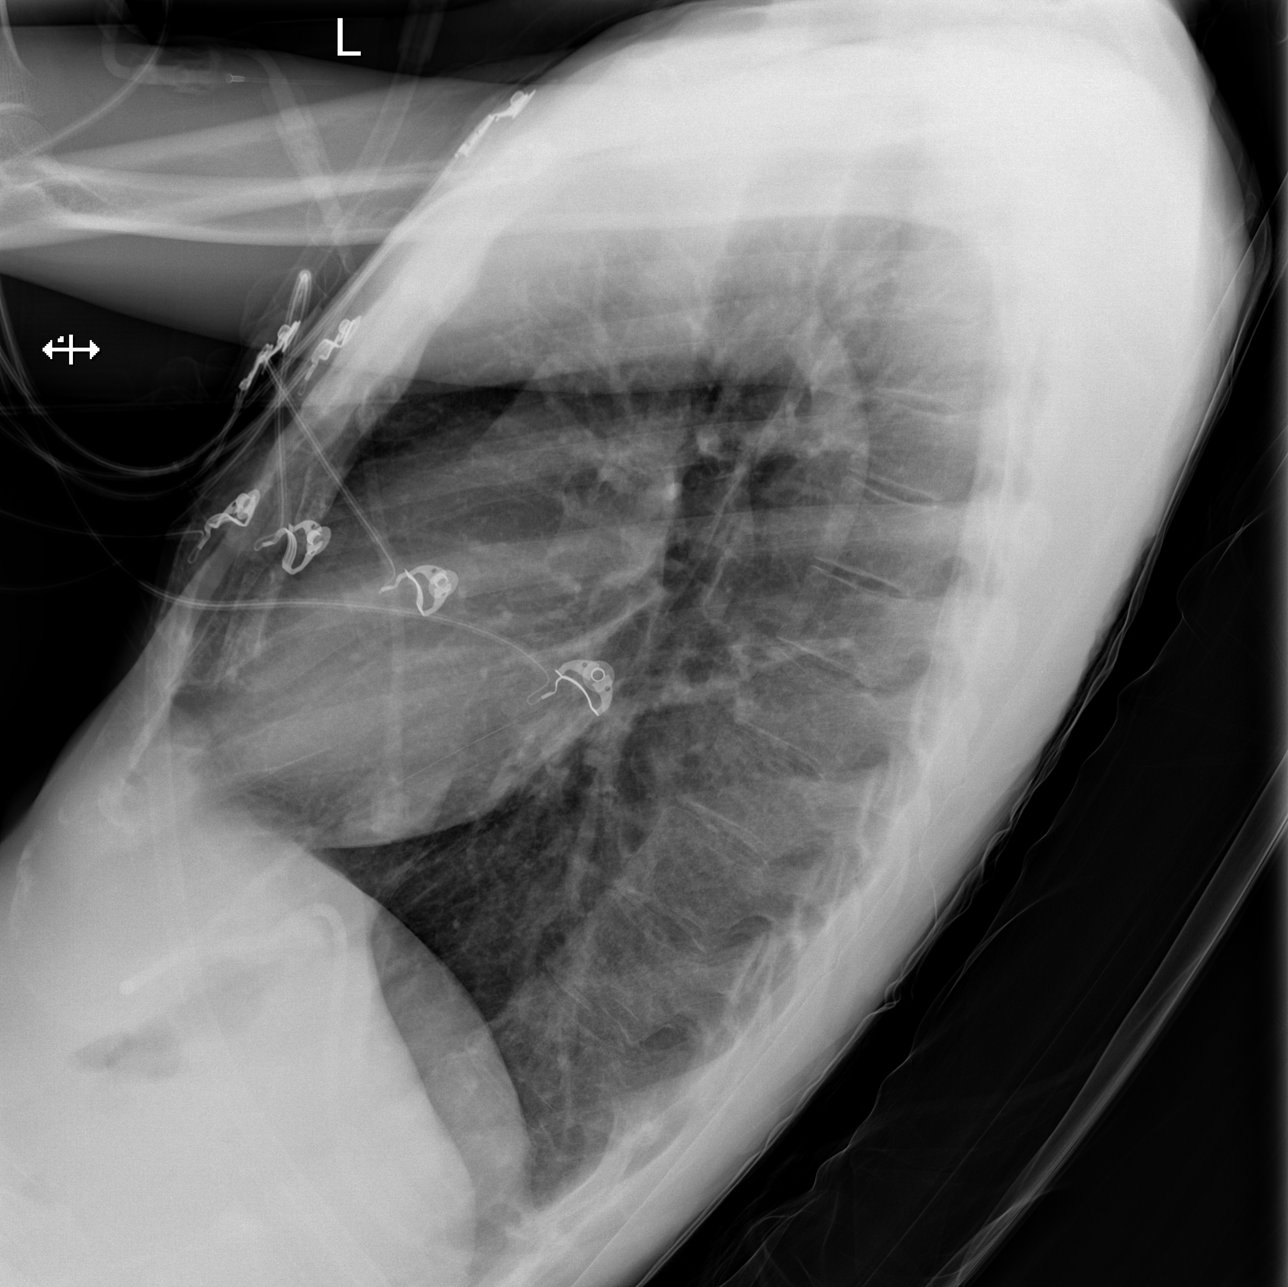

[x chest ap (1 of 2)]
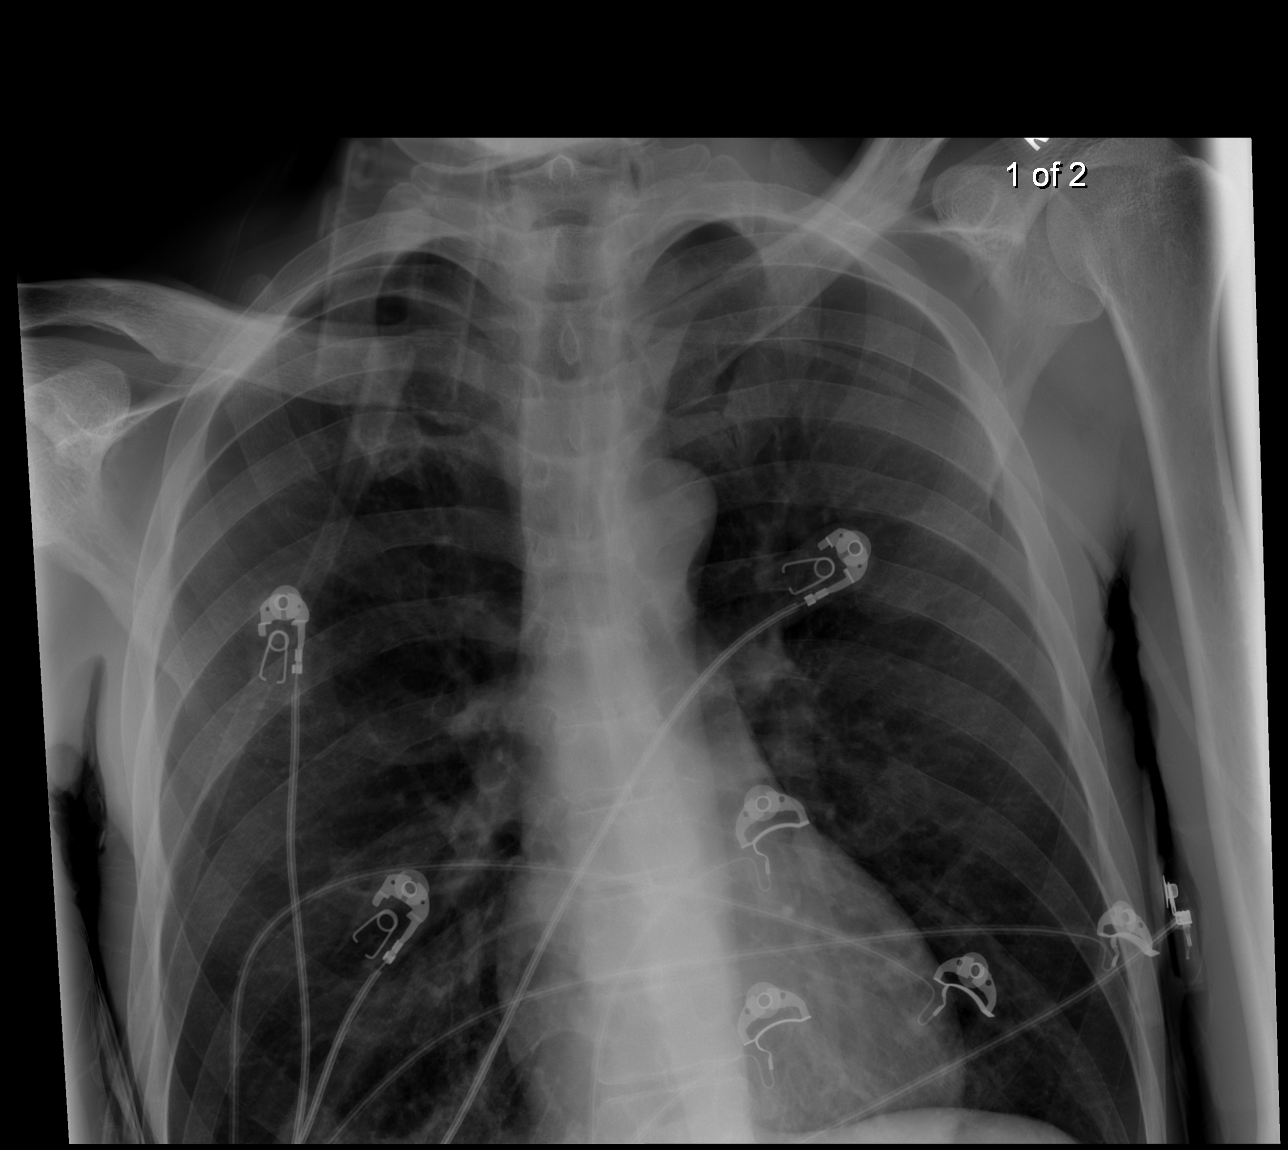

[x chest ap (2 of 2)]
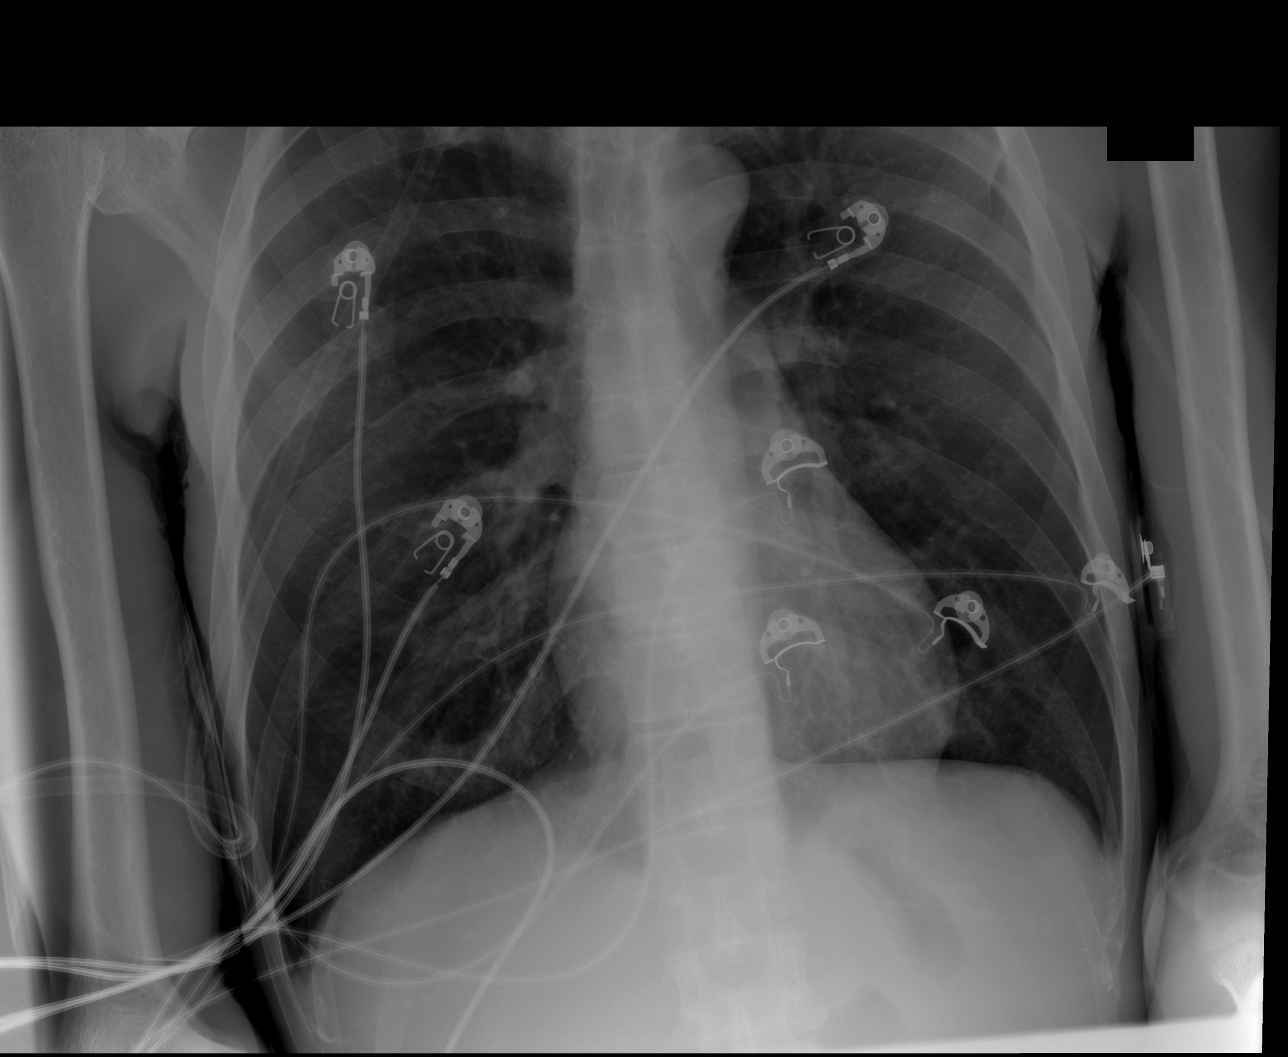

[3 of 3 positions shown; findings below may reference images not displayed]

FINDINGS: Mediastinum hilar structures are normal. Heart size normal. No focal
infiltrate. No pleural effusion or pneumothorax. Thoracic spine
scoliosis.
IMPRESSION: No acute cardiopulmonary disease.

## 2019-06-11 IMAGING — CR DG FOOT COMPLETE 3+V*L*
3 series · 3 of 3 positions shown · non-contrast
Comparison: None.

CLINICAL DATA: Foot pain.  Information.

EXAM:
LEFT FOOT - COMPLETE 3+ VIEW

[x foot ap left]
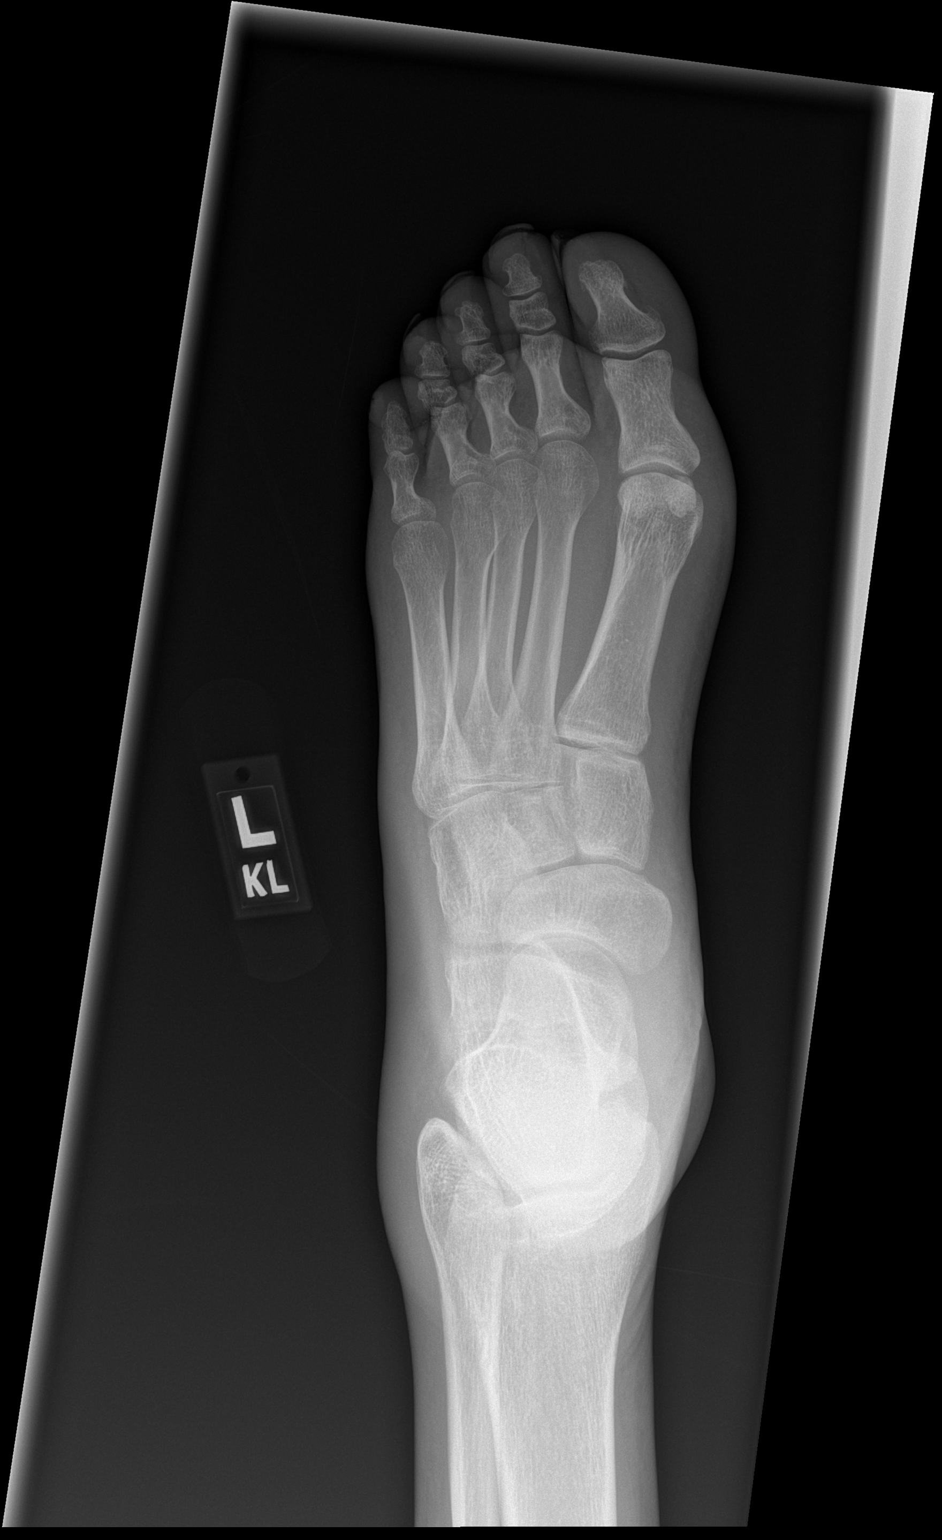

[x foot obl left]
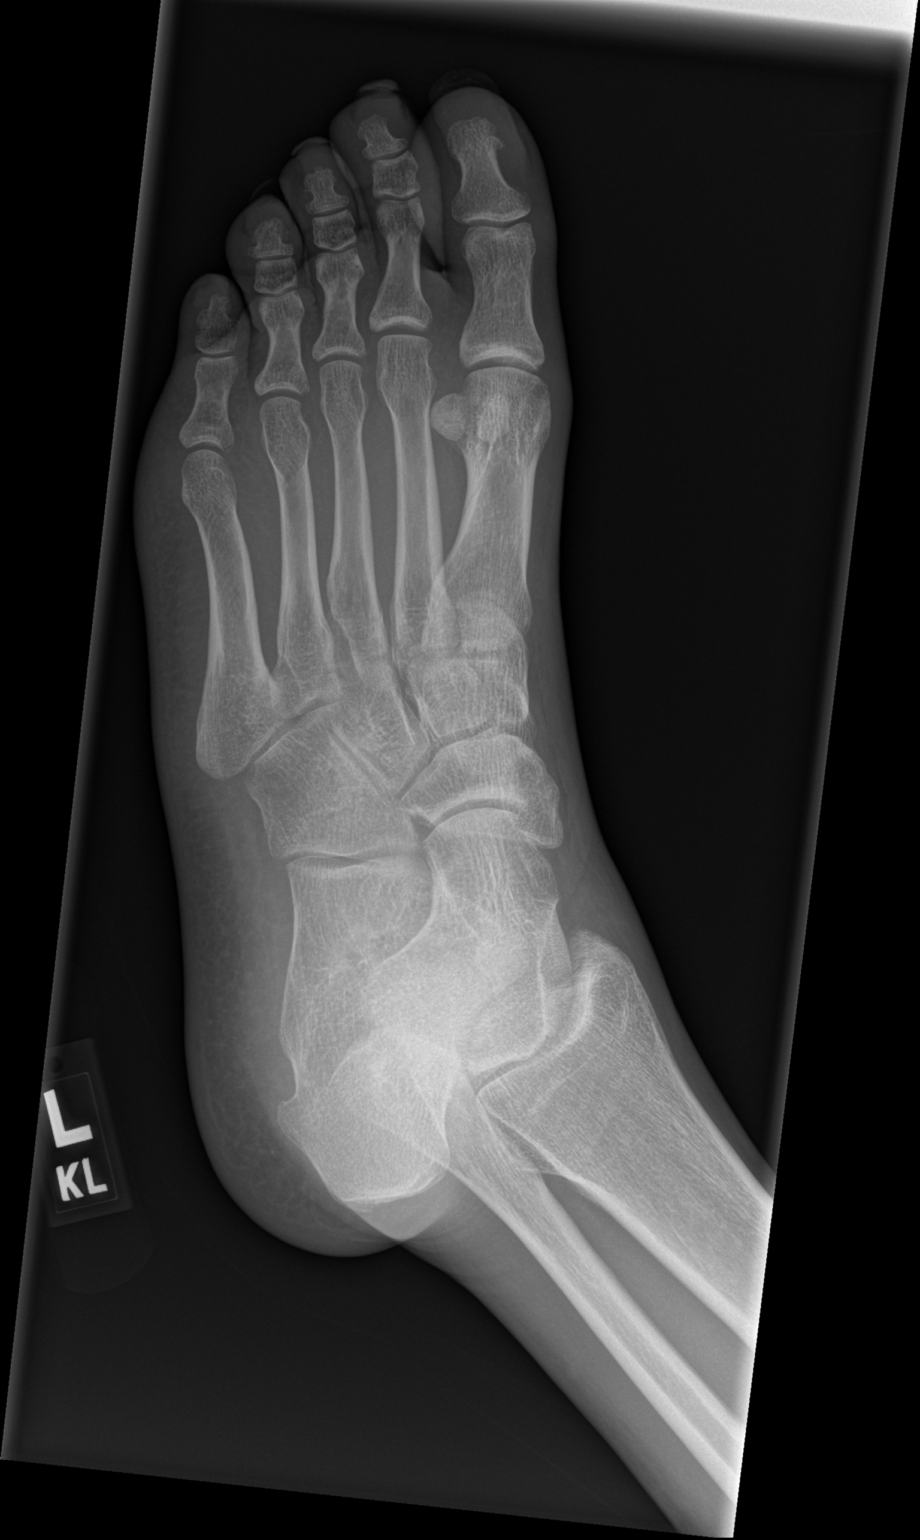

[x foot lat left]
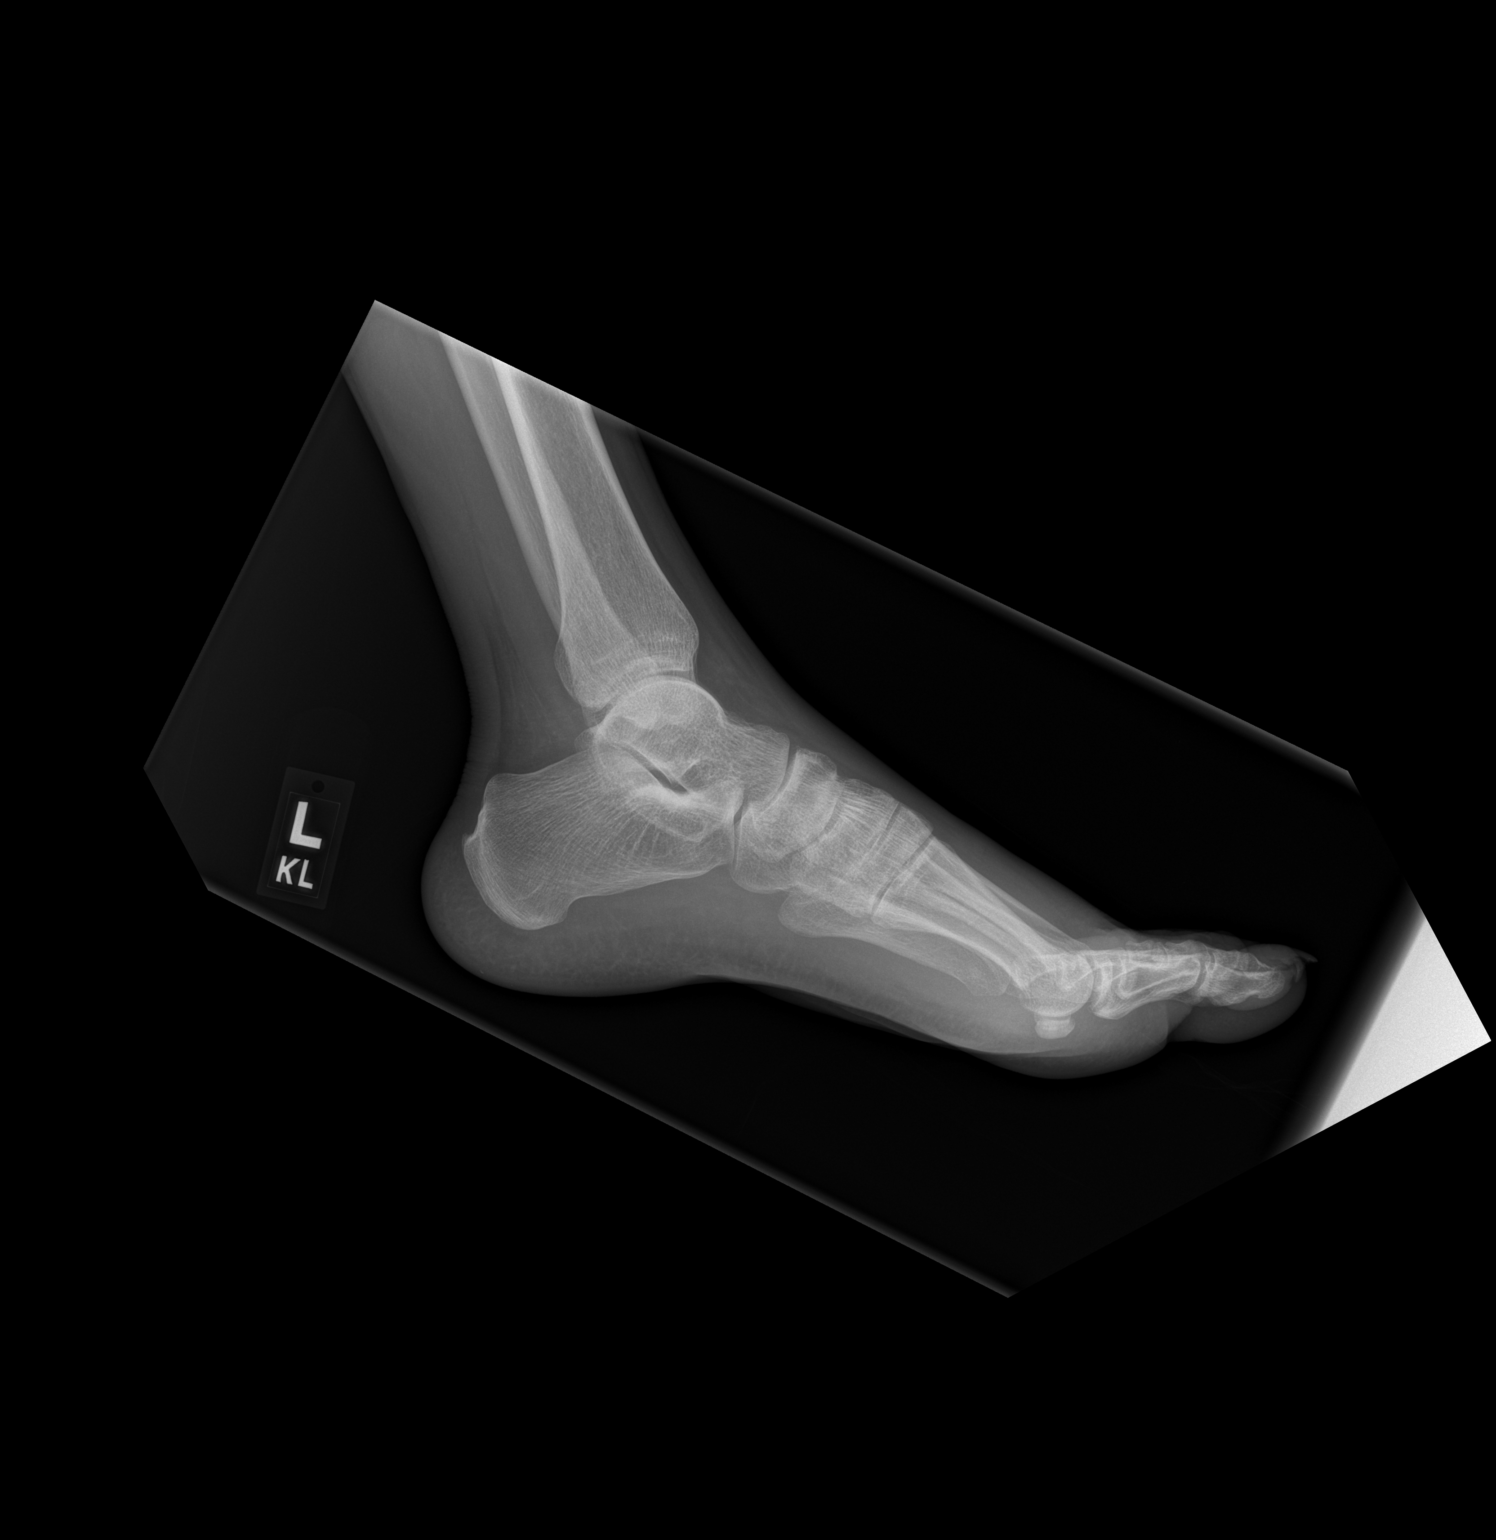

[3 of 3 positions shown; findings below may reference images not displayed]

FINDINGS: No acute bony abnormality identified. No evidence of fracture
dislocation. Mild degenerative changes first MTP joint. No evidence
of inflammatory arthropathy. No radiopaque foreign body.
IMPRESSION: Mild degenerative changes first MTP joint. No inflammatory
arthropathy. No acute bony abnormality.

## 2019-06-24 ENCOUNTER — Other Ambulatory Visit: Payer: Self-pay

## 2019-06-24 ENCOUNTER — Emergency Department
Admission: EM | Admit: 2019-06-24 | Discharge: 2019-06-28 | Disposition: A | Discharge status: Home / Self Care | Payer: Medicaid Other | Attending: Emergency Medicine | Admitting: Emergency Medicine

## 2019-06-24 DIAGNOSIS — R000 Tachycardia, unspecified: Secondary | ICD-10-CM | POA: Diagnosis present

## 2019-06-24 DIAGNOSIS — F250 Schizoaffective disorder, bipolar type: Secondary | ICD-10-CM | POA: Diagnosis present

## 2019-06-24 DIAGNOSIS — D638 Anemia in other chronic diseases classified elsewhere: Secondary | ICD-10-CM | POA: Diagnosis present

## 2019-06-24 DIAGNOSIS — Z046 Encounter for general psychiatric examination, requested by authority: Secondary | ICD-10-CM | POA: Diagnosis present

## 2019-06-24 DIAGNOSIS — F203 Undifferentiated schizophrenia: Secondary | ICD-10-CM | POA: Diagnosis present

## 2019-06-24 DIAGNOSIS — Z20828 Contact with and (suspected) exposure to other viral communicable diseases: Secondary | ICD-10-CM | POA: Diagnosis not present

## 2019-06-24 DIAGNOSIS — F17290 Nicotine dependence, other tobacco product, uncomplicated: Secondary | ICD-10-CM | POA: Diagnosis not present

## 2019-06-24 DIAGNOSIS — N179 Acute kidney failure, unspecified: Secondary | ICD-10-CM | POA: Diagnosis present

## 2019-06-24 DIAGNOSIS — R443 Hallucinations, unspecified: Secondary | ICD-10-CM | POA: Diagnosis not present

## 2019-06-24 DIAGNOSIS — Z008 Encounter for other general examination: Secondary | ICD-10-CM

## 2019-06-24 DIAGNOSIS — R634 Abnormal weight loss: Secondary | ICD-10-CM | POA: Diagnosis present

## 2019-06-24 DIAGNOSIS — F17210 Nicotine dependence, cigarettes, uncomplicated: Secondary | ICD-10-CM | POA: Diagnosis not present

## 2019-06-24 DIAGNOSIS — F4320 Adjustment disorder, unspecified: Secondary | ICD-10-CM | POA: Diagnosis present

## 2019-06-24 LAB — COMPREHENSIVE METABOLIC PANEL
ALT: 15 U/L (ref 0–44)
AST: 20 U/L (ref 15–41)
Albumin: 3.6 g/dL (ref 3.5–5.0)
Alkaline Phosphatase: 67 U/L (ref 38–126)
Anion gap: 7 (ref 5–15)
BUN: 10 mg/dL (ref 6–20)
CO2: 29 mmol/L (ref 22–32)
Calcium: 9 mg/dL (ref 8.9–10.3)
Chloride: 102 mmol/L (ref 98–111)
Creatinine, Ser: 0.89 mg/dL (ref 0.61–1.24)
GFR calc Af Amer: >60 mL/min (ref >60)
GFR calc non Af Amer: >60 mL/min (ref >60)
Glucose, Bld: 92 mg/dL (ref 70–99)
Potassium: 3.6 mmol/L (ref 3.5–5.1)
Sodium: 138 mmol/L (ref 135–145)
Total Bilirubin: 0.8 mg/dL (ref 0.3–1.2)
Total Protein: 8 g/dL (ref 6.5–8.1)

## 2019-06-24 LAB — CBC WITH DIFFERENTIAL/PLATELET
Abs Immature Granulocytes: 0.02 10*3/uL (ref 0.00–0.07)
Basophils Absolute: 0 10*3/uL (ref 0.0–0.1)
Basophils Relative: 0 %
Eosinophils Absolute: 0.1 10*3/uL (ref 0.0–0.5)
Eosinophils Relative: 1 %
HCT: 43.9 % (ref 39.0–52.0)
Hemoglobin: 14 g/dL (ref 13.0–17.0)
Immature Granulocytes: 0 %
Lymphocytes Relative: 17 %
Lymphs Abs: 1.4 10*3/uL (ref 0.7–4.0)
MCH: 29.5 pg (ref 26.0–34.0)
MCHC: 31.9 g/dL (ref 30.0–36.0)
MCV: 92.4 fL (ref 80.0–100.0)
Monocytes Absolute: 0.7 10*3/uL (ref 0.1–1.0)
Monocytes Relative: 8 %
Neutro Abs: 6.1 10*3/uL (ref 1.7–7.7)
Neutrophils Relative %: 74 %
Platelets: 291 10*3/uL (ref 150–400)
RBC: 4.75 MIL/uL (ref 4.22–5.81)
RDW: 12.3 % (ref 11.5–15.5)
WBC: 8.3 10*3/uL (ref 4.0–10.5)
nRBC: 0 % (ref 0.0–0.2)

## 2019-06-24 LAB — ETHANOL: Alcohol, Ethyl (B): <10 mg/dL (ref <10)

## 2019-06-24 LAB — SARS CORONAVIRUS 2 BY RT PCR (HOSPITAL ORDER, PERFORMED IN ~~LOC~~ HOSPITAL LAB): SARS Coronavirus 2: NEGATIVE

## 2019-06-24 LAB — SALICYLATE LEVEL: Salicylate Lvl: <7 mg/dL (ref 2.8–30.0)

## 2019-06-24 LAB — ACETAMINOPHEN LEVEL: Acetaminophen (Tylenol), Serum: <10 ug/mL — ABNORMAL LOW (ref 10–30)

## 2019-06-24 MED ORDER — CIPROFLOXACIN HCL 500 MG PO TABS
500.0000 mg | ORAL_TABLET | Freq: Two times a day (BID) | ORAL | Status: DC
  Administered 2019-06-24 – 2019-06-28 (×8): 500 mg via ORAL
  Filled 2019-06-24 (×10): qty 1

## 2019-06-24 MED ORDER — BENZTROPINE MESYLATE 1 MG PO TABS
1.0000 mg | ORAL_TABLET | Freq: Every day | ORAL | Status: DC
  Administered 2019-06-24 – 2019-06-28 (×5): 1 mg via ORAL
  Filled 2019-06-24 (×5): qty 1

## 2019-06-24 MED ORDER — METRONIDAZOLE 500 MG PO TABS
500.0000 mg | ORAL_TABLET | Freq: Two times a day (BID) | ORAL | Status: DC
  Administered 2019-06-24 – 2019-06-28 (×8): 500 mg via ORAL
  Filled 2019-06-24 (×10): qty 1

## 2019-06-24 MED ORDER — MELATONIN 5 MG PO TABS
2.5000 mg | ORAL_TABLET | Freq: Every day | ORAL | Status: DC
  Administered 2019-06-24 – 2019-06-27 (×4): 2.5 mg via ORAL
  Filled 2019-06-24 (×6): qty 0.5

## 2019-06-24 MED ORDER — PALIPERIDONE ER 3 MG PO TB24
6.0000 mg | ORAL_TABLET | ORAL | Status: AC
  Administered 2019-06-24: 6 mg via ORAL
  Filled 2019-06-24: qty 2
  Filled 2019-06-24: qty 1

## 2019-06-24 MED ORDER — MIRTAZAPINE 15 MG PO TABS
15.0000 mg | ORAL_TABLET | Freq: Every day | ORAL | Status: DC
  Administered 2019-06-24 – 2019-06-27 (×4): 15 mg via ORAL
  Filled 2019-06-24 (×4): qty 1

## 2019-06-24 MED ORDER — ATENOLOL 25 MG PO TABS
12.5000 mg | ORAL_TABLET | Freq: Every day | ORAL | Status: DC
  Administered 2019-06-24 – 2019-06-28 (×5): 12.5 mg via ORAL
  Filled 2019-06-24 (×5): qty 1

## 2019-06-24 NOTE — ED Notes (Signed)
Food Tray given to this pt by this tech.

## 2019-06-24 NOTE — Consult Note (Signed)
[facility] Face-to-Face Psychiatry Consult   Reason for Consult: Psychiatric evaluation Referring Physician: Dr. Jari Pigg Patient Identification: Christian Lamb MRN:  [id number] Principal Diagnosis: Schizoaffective disorder, bipolar type ([place]) Diagnosis:  Principal Problem:   Schizoaffective disorder, bipolar type ([place]) Active Problems:   Loss of weight   Chronic tachycardia   Undifferentiated schizophrenia ([place])   Anemia of chronic disease   AKI (acute kidney injury) ([place])   Adjustment disorder   Total Time spent with patient: 1 hour  Subjective: "I am taking my medicine." Christian Lamb is a 43 y.o. male patient presented to [facility] ED via law enforcement under involuntary commitment status (IVC).  It was reported by the patient guardian that the patient was brought in due to him exhibiting symptoms of hallucinations and being medication noncompliant.  During the patient assessment, he denies experiencing hallucinations or not taking his medication.  The patient was asked several questions about hearing voices or seeing things that everybody else does not see.  He denies experiencing hallucinations.  The patient is currently a client of [facility] team, which he has been in contact with his ACT team and was given his Invega 156 mg last week.  This provider spoke to [employer] [facility] [place] the crisis number is [phone]).  Ms. Everlene Balls acknowledges having the patient has a client, but the patient has been in their service for two weeks.  She states she is aware that the patient was hospitalized at [facility] about a month ago, and his hospital stay was two weeks in length.  She said the patient received his Invega injection last week, and he is due for another injection on 06/25/2019.  She disclosed, the team is receiving the same complaint about the patient from his caregiver.  She states the patient is new to their service; therefore, she cannot provide this provider with any other  information then what the caregiver has provided.   The patient was seen face-to-face by this provider; the chart reviewed and consulted with Dr. Jari Pigg on 06/24/2019 due to the patient's care. It was discussed with the EDP that the patient would remain on observation overnight and reassess in the a.m to determine if he meets the criteria for psychiatric inpatient admission or be discharged back home.  The patient is alert and oriented x3, calm, cooperative, and mood-congruent with affect on evaluation. The patient does not appear to be responding to internal or external stimuli. Neither is the patient presenting with any delusional thinking.  The patient discloses to this provider that he has been having a difficult time being quarantine.  He denies knowing about the virus, which is why he cannot go out and visit friends and family.  The patient denies auditory or visual hallucinations.  He states, "I do not hear no voices." He continues to deny that people are following him or someone is trying to hurt him.  The patient denies suicidal, homicidal, or self-harm ideations. The patient is not presenting with any psychotic or paranoid behaviors. During an encounter with the patient, he was able to answer questions appropriately. The patient-caregiver, [name], obtained collateral [phone], who expresses concerns for the patient's medication noncompliance, the patient is awake overnight, exhibiting hallucinations symptoms and "he is just not right".  [name] endorses, the patient is actively hallucinating and refusing to take his medication.  He disclosed the patient was hospitalized in August at [facility], which he maintained his admission for two weeks and was then discharged to home.  He stated the patient doctors are not listening to him; he continues to tell them, "Threasa Heads, just not right."  "He says no one is hearing me."  He voiced the patient is awake all night and leaving the front  door open along with constant roaming through the house.  He states, he does not take his medication and "I can make a grown man take medicines if he does not want to." He voiced he does not know what else to do.  Plan:  The patient will be observed overnight and reassess in the a.m to determine if he meets the criteria for psychiatric inpatient to be discharged back home.    HPI:  Per Dr. Jari Pigg; Christian Lamb is a 43 y.o. male with schizophrenia who presents under IVC due to allegations of being noncompliant with medications and having hallucinations.  Patient denies any SI or HI.  Patient is cooperative.  He is unsure why he is here himself.  Per patient he gets a shot that he got on time and took some oral pills that he has not missed.  He denies any auditory visual hallucinations.  Denies any alcohol or drug use.  He denies any other medical complaints.  Past Psychiatric History:  Anxiety Schizophrenia ([place])  Risk to Self:  No Risk to Others:  No Prior Inpatient Therapy:  Yes Prior Outpatient Therapy:  Yes  Past Medical History:  Past Medical History:  Diagnosis Date  . Anemia   . Anxiety   . Osteomyelitis ([place]) 01/2018   RIGHT FOOT  . Schizophrenia ([place])   . Ulcerative colitis [facility])     Past Surgical History:  Procedure Laterality Date  . AMPUTATION Right 01/18/2018   Procedure: AMPUTATION OF TOES;  Surgeon: Newt Minion, MD;  Location: [place];  Service: Orthopedics;  Laterality: Right;  . POUCHOSCOPY N/A 05/06/2019   Procedure: POUCHOSCOPY;  Surgeon: Lin Landsman, MD;  Location: [facility] ENDOSCOPY;  Service: Gastroenterology;  Laterality: N/A;  . SMALL INTESTINE SURGERY  2016   in [place]  . TOTAL COLECTOMY  2003   Family History:  Family History  Problem Relation Age of Onset  . Colon cancer Father 2  . Stomach cancer Neg Hx    Family Psychiatric  History:  Social History:  Social History   Substance and Sexual Activity  Alcohol Use No  . Alcohol/week:  0.0 standard drinks     Social History   Substance and Sexual Activity  Drug Use No    Social History   Socioeconomic History  . Marital status: Single    Spouse name: Not on file  . Number of children: Not on file  . Years of education: Not on file  . Highest education level: Not on file  Occupational History  . Not on file  Social Needs  . Financial resource strain: Not on file  . Food insecurity    Worry: Not on file    Inability: Not on file  . Transportation needs    Medical: Not on file    Non-medical: Not on file  Tobacco Use  . Smoking status: Current Every Day Smoker    Packs/day: 0.25    Types: Cigarettes, Cigars  . Smokeless tobacco: Never Used  Substance and Sexual Activity  . Alcohol use: No    Alcohol/week: 0.0 standard drinks  . Drug use: No  . Sexual activity: Not Currently  Lifestyle  . Physical activity    Days per week: Not on file  Minutes per session: Not on file  . Stress: Not on file  Relationships  . Social Herbalist on phone: Not on file    Gets together: Not on file    Attends religious service: Not on file    Active member of club or organization: Not on file    Attends meetings of clubs or organizations: Not on file    Relationship status: Not on file  Other Topics Concern  . Not on file  Social History Narrative  . Not on file   Additional Social History:    Allergies:  No Known Allergies  Labs:  Results for orders placed or performed during the hospital encounter of 06/24/19 (from the past 48 hour(s))  Comprehensive metabolic panel     Status: None   Collection Time: 06/24/19  6:52 PM  Result Value Ref Range   Sodium 138 135 - 145 mmol/L   Potassium 3.6 3.5 - 5.1 mmol/L   Chloride 102 98 - 111 mmol/L   CO2 29 22 - 32 mmol/L   Glucose, Bld 92 70 - 99 mg/dL   BUN 10 6 - 20 mg/dL   Creatinine, Ser 0.89 0.61 - 1.24 mg/dL   Calcium 9.0 8.9 - 10.3 mg/dL   Total Protein 8.0 6.5 - 8.1 g/dL   Albumin 3.6 3.5 -  5.0 g/dL   AST 20 15 - 41 U/L   ALT 15 0 - 44 U/L   Alkaline Phosphatase 67 38 - 126 U/L   Total Bilirubin 0.8 0.3 - 1.2 mg/dL   GFR calc non Af Amer >60 >60 mL/min   GFR calc Af Amer >60 >60 mL/min   Anion gap 7 5 - 15    Comment: Performed at [facility], [address], [place], [place] [address]  Ethanol     Status: None   Collection Time: 06/24/19  6:52 PM  Result Value Ref Range   Alcohol, Ethyl (B) <10 <10 mg/dL    Comment: (NOTE) Lowest detectable limit for serum alcohol is 10 mg/dL. For medical purposes only. Performed at [facility], [address] [place], [place] [address]   CBC with Diff     Status: None   Collection Time: 06/24/19  6:52 PM  Result Value Ref Range   WBC 8.3 4.0 - 10.5 K/uL   RBC 4.75 4.22 - 5.81 MIL/uL   Hemoglobin 14.0 13.0 - 17.0 g/dL   HCT 43.9 39.0 - 52.0 %   MCV 92.4 80.0 - 100.0 fL   MCH 29.5 26.0 - 34.0 pg   MCHC 31.9 30.0 - 36.0 g/dL   RDW 12.3 11.5 - 15.5 %   Platelets 291 150 - 400 K/uL   nRBC 0.0 0.0 - 0.2 %   Neutrophils Relative % 74 %   Neutro Abs 6.1 1.7 - 7.7 K/uL   Lymphocytes Relative 17 %   Lymphs Abs 1.4 0.7 - 4.0 K/uL   Monocytes Relative 8 %   Monocytes Absolute 0.7 0.1 - 1.0 K/uL   Eosinophils Relative 1 %   Eosinophils Absolute 0.1 0.0 - 0.5 K/uL   Basophils Relative 0 %   Basophils Absolute 0.0 0.0 - 0.1 K/uL   Immature Granulocytes 0 %   Abs Immature Granulocytes 0.02 0.00 - 0.07 K/uL    Comment: Performed at [facility], [place], [place] [address]  Acetaminophen level     Status: Abnormal   Collection Time: 06/24/19  6:52 PM  Result Value Ref Range   Acetaminophen (Tylenol), Serum <10 (L) 10 - 30 ug/mL    Comment: (NOTE) Therapeutic concentrations vary significantly. A range of 10-30 ug/mL  may be an effective concentration for many patients. However, some  are best treated at concentrations outside of this range. Acetaminophen concentrations >150 ug/mL at 4  hours after ingestion  and >50 ug/mL at 12 hours after ingestion are often associated with  toxic reactions. Performed at [facility], [address] [place], [place] [address]   Salicylate level     Status: None   Collection Time: 06/24/19  6:52 PM  Result Value Ref Range   Salicylate Lvl <0.9 2.8 - 30.0 mg/dL    Comment: Performed at [facility], [address] [place], [place] [address]    Current Facility-Administered Medications  Medication Dose Route Frequency Provider Last Rate Last Dose  . atenolol (TENORMIN) tablet 12.5 mg  12.5 mg Oral Daily Caroline Sauger, NP      . benztropine (COGENTIN) tablet 1 mg  1 mg Oral Daily Caroline Sauger, NP      . ciprofloxacin (CIPRO) tablet 500 mg  500 mg Oral BID Caroline Sauger, NP      . Melatonin TABS 2.5 mg  2.5 mg Oral QHS Caroline Sauger, NP      . metroNIDAZOLE (FLAGYL) tablet 500 mg  500 mg Oral BID Caroline Sauger, NP      . mirtazapine (REMERON) tablet 15 mg  15 mg Oral QHS Caroline Sauger, NP      . paliperidone (INVEGA) 24 hr tablet 6 mg  6 mg Oral NOW Mallie Darting Cordie Grice, MD       Current Outpatient Medications  Medication Sig Dispense Refill  . atenolol (TENORMIN) 25 MG tablet Take 0.5 tablets (12.5 mg total) by mouth daily. 30 tablet 1  . benztropine (COGENTIN) 1 MG tablet Take 1 mg by mouth daily.    . ciprofloxacin (CIPRO) 500 MG tablet Take 1 tablet (500 mg total) by mouth 2 (two) times daily. 60 tablet 1  . INVEGA SUSTENNA 156 MG/ML SUSY injection Inject 156 mg into the muscle every 30 (thirty) days.    . Melatonin 3 MG TABS Take 3 mg by mouth at bedtime.     . metroNIDAZOLE (FLAGYL) 500 MG tablet Take 1 tablet (500 mg total) by mouth 2 (two) times daily. 60 tablet 1  . mirtazapine (REMERON) 15 MG tablet Take 15 mg by mouth at bedtime.      Musculoskeletal: Strength & Muscle Tone: decreased Gait & Station: unsteady Patient leans: N/A  Psychiatric Specialty  Exam: Physical Exam  Nursing note and vitals reviewed. Constitutional: He appears well-developed.  Eyes: Pupils are equal, round, and reactive to light.  Neck: Normal range of motion. Neck supple.  Respiratory: Effort normal.  Musculoskeletal:        General: Deformity present.  Neurological: He is alert.  Skin: Skin is warm and dry.  Psychiatric: His behavior is normal. Thought content normal.    Review of Systems  Psychiatric/Behavioral: The patient is nervous/anxious.   All other systems reviewed and are negative.   Blood pressure (!) 144/72, pulse (!) 118, temperature 98 F (36.7 C), temperature source Oral, resp. rate 16, height 6' 2"  (1.88 m), weight 61.2 kg, SpO2 100 %.Body mass index is 17.33 kg/m.  General Appearance: Casual  Eye Contact:  Minimal  Speech:  Clear and Coherent and Normal Rate  Volume:  Normal  Mood:  Anxious  Affect:  Appropriate, Congruent and Depressed  Thought Process:  Coherent  Orientation:  Full (Time, Place, and Person)  Thought Content:  WDL and Logical  Suicidal Thoughts:  No  Homicidal Thoughts:  No  Memory:  Immediate;   Good Recent;   Good Remote;   Good  Judgement:  Fair  Insight:  Fair  Psychomotor Activity:  Decreased  Concentration:  Concentration: Good and Attention Span: Good  Recall:  Good  Fund of Knowledge:  Fair  Language:  Fair  Akathisia:  Negative  Handed:  Right  AIMS (if indicated):     Assets:  Communication Skills Desire for Improvement Social Support  ADL's:  Intact  Cognition:  Impaired,  Mild  Sleep:   Good     Treatment Plan Summary: Daily contact with patient to assess and evaluate symptoms and progress in treatment, Medication management and Plan The patient will be observed overnight and reassess in the a.m. to determine whether he meets criteria for psychiatric inpatient admission or to be discharged back home.  Disposition: Supportive therapy provided about ongoing stressors. Patient will be  observed overnight and reassessed in the a.m. to determine if he meets criteria for psychiatric inpatient admission or to be discharged back home.  Caroline Sauger, NP 06/24/2019 9:49 PM

## 2019-06-24 NOTE — ED Notes (Signed)
Patient assigned to appropriate care area   Introduced self to pt  Patient oriented to unit/care area: Informed that, for their safety, care areas are designed for safety and visiting and phone hours explained to patient. Patient verbalizes understanding, and verbal contract for safety obtained  Environment secured Brought to ER by [employer] under IVC due to allegations of being noncompliant with medications, hallucinating. Denies SI or HI. Pt cooperative, unsure why is he is here.

## 2019-06-24 NOTE — ED Notes (Signed)
Report to include Situation, Background, Assessment, and Recommendations received from [facility]. Patient alert and oriented, warm and dry, in no acute distress. Patient denies SI, HI, AVH and pain. Patient made aware of Q15 minute rounds and security cameras for their safety. Patient instructed to come to me with needs or concerns.

## 2019-06-24 NOTE — ED Notes (Signed)
Pharmacy called for all meds.

## 2019-06-24 NOTE — ED Triage Notes (Signed)
Brought to ER by [employer] Officer under IVC due to allegations of being noncompliant with medications, hallucinating. Denies SI or HI. Pt cooperative, unsure why is he is here.

## 2019-06-24 NOTE — ED Provider Notes (Signed)
[facility] Emergency Department Provider Note  ____________________________________________   First MD Initiated Contact with Patient 06/24/19 1817     (approximate)  I have reviewed the triage vital signs and the nursing notes.   HISTORY  Chief Complaint IVC and Psychiatric Evaluation    HPI Christian Lamb is a 43 y.o. male with schizophrenia who presents under IVC due to allegations of being noncompliant with medications and having hallucinations.  Patient denies any SI or HI.  Patient is cooperative.  He is unsure why he is here himself.  Per patient he gets a shot that he got on time and took some oral pills that he has not missed.  He denies any auditory visual hallucinations.  Denies any alcohol or drug use.  He denies any other medical complaints.          Past Medical History:  Diagnosis Date  . Anemia   . Anxiety   . Osteomyelitis ([place]) 01/2018   RIGHT FOOT  . Schizophrenia ([place])   . Ulcerative colitis [facility])     Patient Active Problem List   Diagnosis Date Noted  . Adjustment disorder 05/07/2019  . Inflammatory bowel disease 05/07/2019  . Toe osteomyelitis ([place]) 03/05/2018  . Osteomyelitis ([place]) 01/16/2018  . Necrotic toes ([place]) 01/16/2018  . 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 Anemia of chronic disease 12/05/2017  . AKI (acute kidney injury) ([place]) 12/05/2017  . Rhabdomyolysis 12/05/2017  . Elevated liver enzymes 12/05/2017  . Protein-calorie malnutrition, severe 12/05/2017  . Sepsis ([place]) 12/04/2017  . Loss of weight 03/21/2017  . Chronic diarrhea 03/21/2017  . Ulcerative colitis with complication ([place]) 40/98/1191  . Tobacco use disorder 02/12/2016  . Schizoaffective disorder, bipolar type ([place]) 02/11/2016  . Chronic tachycardia 01/13/2015  . 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 Undifferentiated schizophrenia ([place]) 01/13/2015    Past Surgical History:  Procedure Laterality Date  . AMPUTATION Right 01/18/2018   Procedure: AMPUTATION OF TOES;  Surgeon: Newt Minion, MD;  Location: [place];  Service: Orthopedics;  Laterality: Right;  . POUCHOSCOPY N/A 05/06/2019   Procedure: POUCHOSCOPY;  Surgeon: Lin Landsman, MD;  [facility] ENDOSCOPY;  Service: Gastroenterology;  Laterality: N/A;  . SMALL INTESTINE SURGERY  2016   in [place]  . TOTAL COLECTOMY  2003    Prior to Admission medications   Medication Sig Start Date End Date Taking? Authorizing Provider  atenolol (TENORMIN) 25 MG tablet Take 0.5 tablets (12.5 mg total) by mouth daily. 12/19/17   Roxan Hockey, MD  ciprofloxacin (CIPRO) 500 MG tablet Take 1 tablet (500 mg total) by mouth 2 (two) times daily. 06/01/19 07/01/19  Lin Landsman, MD  Melatonin 3 MG TABS Take by mouth. 05/18/19   [provider]  metroNIDAZOLE (FLAGYL) 500 MG tablet Take 1 tablet (500 mg total) by mouth 2 (two) times daily. 06/01/19 07/01/19  Lin Landsman, MD  mirtazapine (REMERON SOL-TAB) 15 MG disintegrating tablet Take 15 mg by mouth at bedtime.    [provider]    Allergies Patient has no known allergies.  Family History  Problem Relation Age of Onset  . Colon cancer Father 79  . Stomach cancer Neg Hx     Social History Social History   Tobacco Use  . Smoking status: Current Every Day Smoker    Packs/day: 0.25    Types: Cigarettes, Cigars  . Smokeless tobacco: Never Used  Substance Use Topics  . Alcohol use: No    Alcohol/week: 0.0 standard  drinks  . Drug use: No      Review of Systems Constitutional: No fever/chills Eyes: No visual changes. ENT: No sore throat. Cardiovascular: Denies chest pain. Respiratory: Denies shortness of breath. Gastrointestinal: No abdominal pain.  No nausea, no vomiting.  No diarrhea.  No constipation. Genitourinary: Negative for dysuria. Musculoskeletal: Negative for back pain. Skin: Negative for rash. Neurological: Negative for headaches, focal weakness or numbness. All other  ROS negative ____________________________________________   PHYSICAL EXAM:  VITAL SIGNS: ED Triage Vitals [06/24/19 1750]  Enc Vitals Group     BP (!) 144/72     Pulse Rate (!) 118     Resp 16     Temp 98 F (36.7 C)     Temp Source Oral     SpO2 100 %     Weight 135 lb (61.2 kg)     Height 6' 2"  (1.88 m)     Head Circumference      Peak Flow      Pain Score 0     Pain Loc      Pain Edu?      Excl. in [place]?     Constitutional: Alert and oriented. Well appearing and in no acute distress. Eyes: Conjunctivae are normal. EOMI. Head: Atraumatic. Nose: No congestion/rhinnorhea. Mouth/Throat: Mucous membranes are moist.   Neck: No stridor. Trachea Midline. FROM Cardiovascular: Normal rate, regular rhythm. Grossly normal heart sounds.  Good peripheral circulation. Respiratory: Normal respiratory effort.  No retractions. Lungs CTAB. Gastrointestinal: Soft and nontender. No distention. No abdominal bruits.  Musculoskeletal: No lower extremity tenderness nor edema.  No joint effusions. Neurologic:  Normal speech and language. No gross focal neurologic deficits are appreciated.  Skin:  Skin is warm, dry and intact. No rash noted. Psychiatric: Mood and affect are normal. Speech and behavior are normal.  Denies SI, HI, AH, VH GU: Deferred   ____________________________________________   LABS (all labs ordered are listed, but only abnormal results are displayed)  Labs Reviewed  COMPREHENSIVE METABOLIC PANEL  ETHANOL  CBC WITH DIFFERENTIAL/PLATELET  URINE DRUG SCREEN, QUALITATIVE ([place])  ACETAMINOPHEN LEVEL  SALICYLATE LEVEL   ____________________________________________  INITIAL IMPRESSION / ASSESSMENT AND PLAN / ED COURSE  Christian Lamb was evaluated in Emergency Department on 06/24/2019 for the symptoms described in the history of present illness. He was evaluated in the context of the global COVID-19 pandemic, which necessitated consideration that the patient  might be at risk for infection with the SARS-CoV-2 virus that causes COVID-19. Institutional protocols and algorithms that pertain to the evaluation of patients at risk for COVID-19 are in a state of rapid change based on information released by regulatory bodies including the CDC and federal and state organizations. These policies and algorithms were followed during the patient's care in the ED.     Pt is without any acute medical complaints. No exam findings to suggest medical cause of current presentation. Will order psychiatric screening labs and discuss further w/ psychiatric service given patient is IVC  D/d includes but is not limited to psychiatric disease, behavioral/personality disorder, inadequate socioeconomic support, medical.  Based on HPI, exam, unremarkable labs, no concern for acute medical problem at this time. No rigidity, clonus, hyperthermia, focal neurologic deficit, diaphoresis, tachycardia, meningismus, ataxia, gait abnormality or other finding to suggest this visit represents a non-psychiatric problem. Screening labs reviewed.    Given this, pt medically cleared, to be dispositioned per Psych.     ____________________________________________   FINAL CLINICAL IMPRESSION(S) / ED DIAGNOSES  Final diagnoses:  Evaluation by psychiatric service required      MEDICATIONS GIVEN DURING THIS VISIT:  Medications - No data to display   ED Discharge Orders    None       Note:  This document was prepared using Dragon voice recognition software and may include unintentional dictation errors.   Vanessa [place], MD 06/24/19 [phone]

## 2019-06-24 NOTE — ED Notes (Addendum)
Observed dress out by this RN and Officer that brought patient in. Black sneaks, jeans, white shirt, yellow shirt, black socks, black hose for head and silver appearing cross necklace, black necklace with stone pendant,  blue underwear placed in labeled belonging bag 1 of 1.

## 2019-06-25 DIAGNOSIS — F250 Schizoaffective disorder, bipolar type: Secondary | ICD-10-CM

## 2019-06-25 MED ORDER — PALIPERIDONE ER 3 MG PO TB24
6.0000 mg | ORAL_TABLET | Freq: Every day | ORAL | Status: DC
  Administered 2019-06-25 – 2019-06-28 (×4): 6 mg via ORAL
  Filled 2019-06-25 (×2): qty 2
  Filled 2019-06-25: qty 1
  Filled 2019-06-25: qty 2

## 2019-06-25 NOTE — ED Notes (Signed)
Hourly rounding reveals patient sleeping in room. No complaints, stable, in no acute distress. Q15 minute rounds and monitoring via Security to continue.

## 2019-06-25 NOTE — ED Notes (Signed)
Hourly rounding reveals patient in room. No complaints, stable, in no acute distress. Q15 minute rounds and monitoring via Security Cameras to continue. 

## 2019-06-25 NOTE — ED Provider Notes (Signed)
-----------------------------------------   6:28 AM on 06/25/2019 -----------------------------------------   Blood pressure (!) 144/72, pulse (!) 118, temperature 98 F (36.7 C), temperature source Oral, resp. rate 16, height 6' 2"  (1.88 m), weight 61.2 kg, SpO2 100 %.  The patient is calm and cooperative at this time.  There have been no acute events since the last update.  Awaiting disposition plan from Behavioral Medicine and/or Social Work team(s).   Paulette Blanch, MD 06/25/19 [phone]

## 2019-06-26 NOTE — ED Notes (Signed)
Pt up to the nurse station, asking to use the phone. Phone hours explained to pt. Pt understanding. Pt then asking to have spray for his room. This RN explained to the patient that he could not have spray but that this RN would be glad to spray odor eliminator in his room Room sprayed with m9 odor eliminator spray. Pt encouraged to return to bed and rest until breakfast time.

## 2019-06-26 NOTE — ED Notes (Signed)
Meal tray given to patient.

## 2019-06-26 NOTE — ED Notes (Signed)
Pt up to the bathroom then returned to bed.

## 2019-06-26 NOTE — ED Notes (Signed)
Pt up to bathroom and returned to bed. No co's voiced.

## 2019-06-26 NOTE — ED Notes (Signed)
Pt is sitting in dayroom. Pt wanting to use the phone. Pt told the phone hours start at 9 am. Maintained on 15 minute checks and observation by security camera for safety.

## 2019-06-26 NOTE — Consult Note (Signed)
[facility] ED Discharge  06/26/2019 1:31 PM Christian Lamb  MRN:  [id number] Principal Problem: Schizoaffective disorder, bipolar type [facility]) Discharge Diagnoses: Principal Problem:   Schizoaffective disorder, bipolar type ([place]) Active Problems:   Loss of weight   Chronic tachycardia   Undifferentiated schizophrenia ([place])   Anemia of chronic disease   AKI (acute kidney injury) ([place])   Adjustment disorder  Subjective: "I feel fine."  Denies suicidal/homicidal ideations, hallucinations, and substance abuse.   Patient seen and evaluated by this provider in person.  He is calm and cooperative since admission.  No psychosis noted, compliant with his medications.  Sleep and appetite are "good".  No concerns from him except he wants to leave.  His guardian and ACT team request he transfers to a higher level of care, stable psychiatrically.  Awaiting placement.  Notes from 9/17 admission:  Christian Lamb is a 43 y.o. male patient presented to [facility] ED via law enforcement under involuntary commitment status (IVC).  It was reported by the patient guardian that the patient was brought in due to him exhibiting symptoms of hallucinations and being medication noncompliant.  During the patient assessment, he denies experiencing hallucinations or not taking his medication.  The patient was asked several questions about hearing voices or seeing things that everybody else does not see.  He denies experiencing hallucinations.  The patient is currently a client of [facility] team, which he has been in contact with his ACT team and was given his Invega 156 mg last week.  This provider spoke to [employer] [facility] [place] the crisis number is [phone]).  Ms. Everlene Balls acknowledges having the patient has a client, but the patient has been in their service for two weeks.  She states she is aware that the patient was hospitalized at [facility] about a month ago, and his hospital stay was two weeks in  length.  She said the patient received his Invega injection last week, and he is due for another injection on 06/25/2019.  She disclosed, the team is receiving the same complaint about the patient from his caregiver.  She states the patient is new to their service; therefore, she cannot provide this provider with any other information then what the caregiver has provided.  The patient was seen face-to-face by this provider; the chart reviewed and consulted with Dr. Jari Pigg on 06/24/2019 due to the patient's care. It was discussed with the EDP that the patient would remain on observation overnight and reassess in the a.m to determine if he meets the criteria for psychiatric inpatient admission or be discharged back home.   The patient is alert and oriented x3, calm, cooperative, and mood-congruent with affect on evaluation. The patient does not appear to be responding to internal or external stimuli. Neither is the patient presenting with any delusional thinking.  The patient discloses to this provider that he has been having a difficult time being quarantine.  He denies knowing about the virus, which is why he cannot go out and visit friends and family.  The patient denies auditory or visual hallucinations.  He states, "I do not hear no voices." He continues to deny that people are following him or someone is trying to hurt him.  The patient denies suicidal, homicidal, or self-harm ideations. The patient is not presenting with any psychotic or paranoid behaviors. During an encounter with the patient, he was able to answer questions appropriately.   The patient-caregiver, [name], obtained collateral [phone], who expresses concerns  for the patient's medication noncompliance, the patient is awake overnight, exhibiting hallucinations symptoms and "he is just not right".  [name] endorses, the patient is actively hallucinating and refusing to take his medication.  He disclosed the patient was  hospitalized in August at [facility], which he maintained his admission for two weeks and was then discharged to home.  He stated the patient doctors are not listening to him; he continues to tell them, "Threasa Heads, just not right."  "He says no one is hearing me."  He voiced the patient is awake all night and leaving the front door open along with constant roaming through the house.  He states, he does not take his medication and "I can make a grown man take medicines if he does not want to." He voiced he does not know what else to do.  Plan:  The patient will be observed overnight and reassess in the a.m to determine if he meets the criteria for psychiatric inpatient to be discharged back home.  HPI:  Per Dr. Jari Pigg; Christian Lamb a 43 y.o.malewith schizophrenia who presents under IVC due to allegations of being noncompliant with medications and having hallucinations. Patient denies any SI or HI. Patient is cooperative. He is unsure why he is here himself.Per patient he gets a shot that he got on time and took some oral pills that he has not missed. He denies any auditory visual hallucinations. Denies any alcohol or drug use. He denies any other medical complaints.  Total Time spent with patient: 30 minutes  Past Psychiatric History: schizoaffective disorder  Past Medical History:  Past Medical History:  Diagnosis Date  . Anemia   . Anxiety   . Osteomyelitis ([place]) 01/2018   RIGHT FOOT  . Schizophrenia ([place])   . Ulcerative colitis [facility])     Past Surgical History:  Procedure Laterality Date  . AMPUTATION Right 01/18/2018   Procedure: AMPUTATION OF TOES;  Surgeon: Newt Minion, MD;  Location: [facility];  Service: Orthopedics;  Laterality: Right;  . POUCHOSCOPY N/A 05/06/2019   Procedure: POUCHOSCOPY;  Surgeon: Lin Landsman, MD;  Location: [facility] ENDOSCOPY;  Service: Gastroenterology;  Laterality: N/A;  . SMALL INTESTINE SURGERY  2016   in [place]  . TOTAL COLECTOMY  2003    Family History:  Family History  Problem Relation Age of Onset  . Colon cancer Father 105  . Stomach cancer Neg Hx    Family Psychiatric  History: none Social History:  Social History   Substance and Sexual Activity  Alcohol Use No  . Alcohol/week: 0.0 standard drinks     Social History   Substance and Sexual Activity  Drug Use No    Social History   Socioeconomic History  . Marital status: Single    Spouse name: Not on file  . Number of children: Not on file  . Years of education: Not on file  . Highest education level: Not on file  Occupational History  . Not on file  Social Needs  . Financial resource strain: Not on file  . Food insecurity    Worry: Not on file    Inability: Not on file  . Transportation needs    Medical: Not on file    Non-medical: Not on file  Tobacco Use  . Smoking status: Current Every Day Smoker    Packs/day: 0.25    Types: Cigarettes, Cigars  . Smokeless tobacco: Never Used  Substance and Sexual Activity  . Alcohol use: No  Alcohol/week: 0.0 standard drinks  . Drug use: No  . Sexual activity: Not Currently  Lifestyle  . Physical activity    Days per week: Not on file    Minutes per session: Not on file  . Stress: Not on file  Relationships  . Social Herbalist on phone: Not on file    Gets together: Not on file    Attends religious service: Not on file    Active member of club or organization: Not on file    Attends meetings of clubs or organizations: Not on file    Relationship status: Not on file  Other Topics Concern  . Not on file  Social History Narrative  . Not on file    Has this patient used any form of tobacco in the last 30 days? (Cigarettes, Smokeless Tobacco, Cigars, and/or Pipes) None  Current Medications: Current Facility-Administered Medications  Medication Dose Route Frequency Provider Last Rate Last Dose  . atenolol (TENORMIN) tablet 12.5 mg  12.5 mg Oral Daily Caroline Sauger, NP    12.5 mg at 06/26/19 0925  . benztropine (COGENTIN) tablet 1 mg  1 mg Oral Daily Caroline Sauger, NP   1 mg at 06/26/19 0925  . ciprofloxacin (CIPRO) tablet 500 mg  500 mg Oral BID Caroline Sauger, NP   500 mg at 06/26/19 0923  . Melatonin TABS 2.5 mg  2.5 mg Oral QHS Caroline Sauger, NP   2.5 mg at 06/25/19 2152  . metroNIDAZOLE (FLAGYL) tablet 500 mg  500 mg Oral BID Caroline Sauger, NP   500 mg at 06/26/19 0925  . mirtazapine (REMERON) tablet 15 mg  15 mg Oral QHS Caroline Sauger, NP   15 mg at 06/25/19 2152  . paliperidone (INVEGA) 24 hr tablet 6 mg  6 mg Oral Daily Patrecia Pour, NP   6 mg at 06/26/19 0630   Current Outpatient Medications  Medication Sig Dispense Refill  . atenolol (TENORMIN) 25 MG tablet Take 0.5 tablets (12.5 mg total) by mouth daily. 30 tablet 1  . benztropine (COGENTIN) 1 MG tablet Take 1 mg by mouth daily.    . ciprofloxacin (CIPRO) 500 MG tablet Take 1 tablet (500 mg total) by mouth 2 (two) times daily. 60 tablet 1  . INVEGA SUSTENNA 156 MG/ML SUSY injection Inject 156 mg into the muscle every 30 (thirty) days.    . Melatonin 3 MG TABS Take 3 mg by mouth at bedtime.     . metroNIDAZOLE (FLAGYL) 500 MG tablet Take 1 tablet (500 mg total) by mouth 2 (two) times daily. 60 tablet 1  . mirtazapine (REMERON) 15 MG tablet Take 15 mg by mouth at bedtime.     PTA Medications: (Not in a hospital admission)   Musculoskeletal: Strength & Muscle Tone: within normal limits Gait & Station: normal Patient leans: N/A  Psychiatric Specialty Exam: Physical Exam  Nursing note and vitals reviewed. Constitutional: He is oriented to person, place, and time. He appears well-developed and well-nourished.  HENT:  Head: Normocephalic.  Neck: Normal range of motion.  Respiratory: Effort normal.  Musculoskeletal: Normal range of motion.  Neurological: He is alert and oriented to person, place, and time.  Psychiatric: His speech is normal and behavior is  normal. Judgment and thought content normal. His mood appears anxious. His affect is blunt. Cognition and memory are normal.    Review of Systems  Psychiatric/Behavioral: The patient is nervous/anxious.   All other systems reviewed and are negative.  Blood pressure 100/77, pulse 98, temperature 98 F (36.7 C), temperature source Oral, resp. rate 16, height 6' 2"  (1.88 m), weight 61.2 kg, SpO2 100 %.Body mass index is 17.33 kg/m.  General Appearance: Casual  Eye Contact:  Good  Speech:  Normal Rate  Volume:  Normal  Mood:  Anxious, mild  Affect:  Blunt  Thought Process:  Coherent and Descriptions of Associations: Intact  Orientation:  Full (Time, Place, and Person)  Thought Content:  WDL and Logical  Suicidal Thoughts:  No  Homicidal Thoughts:  No  Memory:  Immediate;   Fair Recent;   Fair Remote;   Fair  Judgement:  Fair  Insight:  Fair  Psychomotor Activity:  Normal  Concentration:  Concentration: Fair and Attention Span: Fair  Recall:  [employer] of Knowledge:  Fair  Language:  Good  Akathisia:  No  Handed:  Right  AIMS (if indicated):     Assets:  Housing Leisure Time Physical Health Resilience Social Support  ADL's:  Intact  Cognition:  WNL  Sleep:        Demographic Factors:  Male  Loss Factors: NA  Historical Factors: NA  Risk Reduction Factors:   Sense of responsibility to family, Living with another person, especially a relative, Positive social support and Positive therapeutic relationship  Continued Clinical Symptoms:  Anxiety, mild  Cognitive Features That Contribute To Risk:  None    Suicide Risk:  Minimal: No identifiable suicidal ideation.  Patients presenting with no risk factors but with morbid ruminations; may be classified as minimal risk based on the severity of the depressive symptoms   Plan Of Care/Follow-up recommendations:  Schizoaffective disorder: -Continued Invega 6 mg daily  EPS: -Continued Cogentin 1 mg  daily  Insomnia: -Continued Remeron 15 mg at bedtime -Continued melatonin 2.5 mg PRN Activity:  as tolerated Diet:  heart healthy diet  Disposition: discharge to new group home or PRTF, awaiting social work placement. Waylan Boga, NP 06/26/2019, 1:31 PM

## 2019-06-26 NOTE — ED Notes (Signed)
IVC pending placement

## 2019-06-26 NOTE — ED Provider Notes (Signed)
-----------------------------------------   7:35 AM on 06/26/2019 -----------------------------------------   Blood pressure (!) 144/72, pulse (!) 118, temperature 98 F (36.7 C), temperature source Oral, resp. rate 16, height 6' 2"  (1.88 m), weight 61.2 kg, SpO2 100 %.  The patient is calm and cooperative at this time.  There have been no acute events since the last update.  Awaiting disposition plan from Behavioral Medicine.    Harvest Dark, MD 06/26/19 [phone]

## 2019-06-26 NOTE — ED Notes (Signed)
Patient will be allowed to wear his shoes due to recent amputation per NP.  Animal nutritionist and RN made aware and are in agreement with decision.

## 2019-06-26 NOTE — ED Notes (Signed)
Pt making phone call in dayroom. No behavioral issues at this time. Maintained on 15 minute checks and observation by security camera for safety.

## 2019-06-26 NOTE — ED Notes (Signed)
Pt up to use the bathroom then returned to bed. No co's voiced.

## 2019-06-27 LAB — URINALYSIS, COMPLETE (UACMP) WITH MICROSCOPIC
Bacteria, UA: NONE SEEN
Bilirubin Urine: NEGATIVE
Glucose, UA: NEGATIVE mg/dL
Hgb urine dipstick: NEGATIVE
Ketones, ur: NEGATIVE mg/dL
Nitrite: NEGATIVE
Protein, ur: NEGATIVE mg/dL
Specific Gravity, Urine: 1.013 (ref 1.005–1.030)
pH: 5 (ref 5.0–8.0)

## 2019-06-27 MED ORDER — NICOTINE 21 MG/24HR TD PT24
21.0000 mg | MEDICATED_PATCH | Freq: Once | TRANSDERMAL | Status: AC
  Administered 2019-06-27: 13:00:00 21 mg via TRANSDERMAL
  Filled 2019-06-27: qty 1

## 2019-06-27 NOTE — ED Notes (Signed)
Patient anxious at time of vitals.

## 2019-06-27 NOTE — Social Work (Signed)
TOC CM/SW received consult for assistance with discharge home/group home.  The patient is a 43 year old African American male who was brought to [facility] ED on 06/24/2019 under involuntary commitment (IVC) by [employer] police officers. The patient lives with is brother/legal guardian that the patient has been exhibiting hallucinations and has not been taking his psychotropic medications. Patient denies these allegations.  The patient is current receiving behavioral health treatment at [facility] and has an ACT team. Marylyn Ishihara is one of his crisis team leaders: [phone]. Medication treatment - Invega 141m.    Collaboration:  [facility] [phone] Mrs. Corbertt stated that the patient has been an active member of [employer] about 3 weeks.  [place] [phone] [facility] placement [place] team members to assist in securing a higher level of care.   Call [facility] [place] member Monday AM - 06/28/2019.     MBerenice Bouton MSW, LCSW  [phone]-6pm (weekends) or CSW ED # [phone]

## 2019-06-27 NOTE — ED Notes (Signed)
Report to include Situation, Background, Assessment, and Recommendations received from Amy B. RN. Patient alert and oriented, warm and dry, in no acute distress. Patient denies SI, HI, AVH and pain. Patient made aware of Q15 minute rounds and Engineer, drilling presence for their safety. Patient instructed to come to me with needs or concerns.

## 2019-06-27 NOTE — ED Notes (Signed)
Pt speaking to security office and nurse tech from his doorway. Needs frequent re-direction to stay in his room. Maintained on 15 minute checks and observation by security camera for safety.

## 2019-06-27 NOTE — ED Notes (Signed)
Hourly rounding reveals patient in room. No complaints, stable, in no acute distress. Q15 minute rounds and monitoring via Engineer, drilling to continue.

## 2019-06-27 NOTE — ED Provider Notes (Signed)
-----------------------------------------   5:57 AM on 06/27/2019 -----------------------------------------   Blood pressure 101/78, pulse 74, temperature 97.8 F (36.6 C), temperature source Oral, resp. rate 18, height 6' 2"  (1.88 m), weight 61.2 kg, SpO2 100 %.  The patient is sleeping at this time.  There have been no acute events since the last update.  Awaiting disposition plan from Behavioral Medicine and/or Social Work team(s).   Paulette Blanch, MD 06/27/19 [phone]

## 2019-06-27 NOTE — ED Notes (Signed)
Pt given meal tray.

## 2019-06-27 NOTE — ED Notes (Signed)
Pt complaining of leakage (urine). EDP made aware. RN to obtain urine sample.

## 2019-06-27 NOTE — ED Notes (Signed)
Hourly rounding reveals patient sleeping in room. No complaints, stable, in no acute distress. Q15 minute rounds and monitoring via Engineer, drilling to continue.

## 2019-06-28 MED ORDER — PALIPERIDONE ER 6 MG PO TB24: 6.0000 mg | ORAL_TABLET | Freq: Every day | ORAL | 0 refills | Status: DC

## 2019-06-28 MED ORDER — HYDROXYZINE HCL 25 MG PO TABS
25.0000 mg | ORAL_TABLET | Freq: Once | ORAL | Status: AC
  Administered 2019-06-28: 25 mg via ORAL
  Filled 2019-06-28: qty 1

## 2019-06-28 NOTE — ED Notes (Signed)
Hourly rounding reveals patient sleeping in room. No complaints, stable, in no acute distress. Q15 minute rounds and monitoring via Engineer, drilling to continue.

## 2019-06-28 NOTE — ED Notes (Signed)
Patient and caregiver voice understanding of discharge instructions, no signs of distress, all belongings back with patient.

## 2019-06-28 NOTE — ED Provider Notes (Signed)
-----------------------------------------   6:16 AM on 06/28/2019 -----------------------------------------   Blood pressure 109/74, pulse (!) 118, temperature 97.6 F (36.4 C), temperature source Oral, resp. rate 18, height 6' 2"  (1.88 m), weight 61.2 kg, SpO2 99 %.  The patient had no acute events since last update.  Calm and cooperative at this time.  Disposition is pending per Psychiatry/Behavioral Medicine team recommendations.     Alfred Levins, [place], MD 06/28/19 [phone]

## 2019-06-28 NOTE — ED Notes (Signed)
Nurse talked with Patient, He is anxious, keeps looking out door, asking same questions over and over, wants to be able to leave, let him know He has to wait for placement, let him know that caseworker will talk to him today at some point, notified MD regarding His anxiety.

## 2019-06-28 NOTE — Social Work (Signed)
CSW contacted patient's ACT team at [facility]. CSW spoke with Marylyn Ishihara who stated they are not looking for a higher level of care (housing) for patient.  CSW attempted to contact patient's guardian to see if patient has a Glass blower/designer. No answer, and voicemail not set up.  CSW will attempt to reach guardian again momentarily.     [place] [place]  [phone]

## 2019-06-28 NOTE — ED Notes (Signed)
Hourly rounding reveals patient awake in room. No complaints, stable, in no acute distress. Q15 minute rounds and monitoring via Engineer, drilling to continue.

## 2019-06-28 NOTE — ED Provider Notes (Signed)
3:13 PM Patient has been seen and evaluated by psychiatry team and deemed appropriate for discharge. Patient determined to not be a threat to themselves or others. IVC rescinded by psychiatry team. Patient is otherwise medically clear.   Will plan for discharge, POA to pick him up. Advised PCP follow up.    Lilia Pro., MD 06/28/19 [phone]

## 2019-06-28 NOTE — Discharge Instructions (Addendum)
Thank you for letting us take care of you in the emergency department today.   Please continue to take any regular, prescribed medications.   Please follow up with: - Your primary care doctor to review your ER visit and follow up on your symptoms.   Please return to the ER for any new or worsening symptoms.     Managing Schizoaffective Disorder If you have been diagnosed with schizoaffective disorder (ScAD), you may be relieved to know why you have felt or behaved a certain way. You may also feel overwhelmed about the treatment ahead, how to get the support you need, and how to deal with the condition day-to-day. With care and support, you can learn to manage your symptoms and live with ScAD. ScAD is a chronic, lifelong condition that may occur in cycles. Periods of severe symptoms may be followed by periods of less severe symptoms or improvement. There are steps you can take to help manage ScAD and make your life better. How to manage lifestyle changes Managing stress Stress is your body's reaction to life changes and events, both good and bad. For people with ScAD, stress can cause more severe symptoms to start (can be a trigger), so it is important to learn ways to deal with stress. Your health care provider, therapist, or counselor may suggest techniques such as:  Meditation, muscle relaxation, and breathing exercises.  Music therapy. This can include creating music or listening to music.  Life skills training. This training is focused on work, self-care, money, house management, and social skills. Other things you can do to manage stress include:  Keeping a stress diary. This can help you learn what causes your stress to start and how you can control your response to those triggers.  Exercising. Even a short daily walk can help.  Getting enough sleep.  Making a schedule to manage your time. Knowing what you will do from day to day helps you avoid feeling overwhelmed by tasks and  deadlines.  Spending time on hobbies you enjoy that help you relax.  Medicines Your health care provider is likely to prescribe various types of medicine depending on your symptoms. These may include one or more of the following types:  Antipsychotics.  Mood stabilizers.  Antidepressants. Make sure you:  Talk with your pharmacist or health care provider about all medicines that you take, the possible side effects, and which medicines are safe to take together.  Make it your goal to take part in all treatment decisions (shared decision-making). Ask about possible side effects of medicines that your health care provider recommends, and tell him or her how you feel about having those side effects. It is best if shared decision-making with your health care provider is part of your total treatment plan. Relationships Having the support of your family and friends can play a major role in the success of your treatment. The following steps can help you maintain healthy relationships:  Think about going to couples therapy, family therapy, or family education classes.  Create a written plan for your treatment, and include close family members and friends in the process.  Consider bringing your partner or another family member or friend to the appointments you have with your health care provider. How to recognize changes in your condition If you find that your condition is getting worse, talk to your health care provider right away. Watch for these signs:  Your mood becomes extreme with either emotional highs or the intense lows of depression.  Your speech becomes unclear.  You are disorganized, show the wrong social behaviors, or withdraw from social activities.  You have racing thoughts and have trouble thinking clearly or staying focused.  You hear, see, taste, and believe things that others do not.  You have poor personal hygiene, weight gain or weight loss, or changes in how you are  sleeping or eating. Follow these instructions at home:  Take over-the-counter and prescription medicines only as told by your health care provider. Do not start new medicines or stop taking medicines before you ask your health care provider if it is safe to make those changes.  Avoid caffeine, alcohol, and drugs. They can affect how your medicine works and can make your symptoms worse.  Eat a healthy diet.  Look for support groups in your area so you can meet other people with your condition. You can learn new methods of managing ScAD by listening to others.  Keep all follow-up visits as told by your health care provider, therapist, or counselor. This is important. Where to find support Talking to others  Reach out to trusted friends or family members, explain your condition, and let them know that you are working with a health care team.  Consider giving educational materials to friends and family.  If you are having trouble telling your friends and family about your condition, keep in mind that honest and open communication can make these conversations easier. Finances Be sure to check with your insurance carrier to find out what treatment options are covered by your plan. You may also be able to find financial assistance through not-for-profit organizations or with local government-based resources. If you are taking medicines, you may be able to get the generic form, which may be less expensive than brand-name medicine. Some makers of prescription medicines also offer help to patients who cannot afford the medicines that they need. Therapy and support groups  Make sure you find a counselor or therapist who is familiar with ScAD. Meet with your counselor or therapist once a week or more often if needed.  Find support programs for people with ScAD. Where to find more information  [employer] on Mental Illness: www.nami.org Contact a health care provider if:  You are not able  to take your medicines as prescribed.  Your symptoms get worse. Get help right away if:  You have serious thoughts about hurting yourself or others. If you ever feel like you may hurt yourself or others, or have thoughts about taking your own life, get help right away. You can go to your nearest emergency department or call:  Your local emergency services (911 in the [place]  A suicide crisis helpline, such as the [place] at 202-383-0211. This is open 24 hours a day. Summary  Schizoaffective disorder (ScAD) is a chronic, lifelong illness. It is best controlled with continuous treatment that includes medicine and therapy.  Learning ways to manage stress may help your treatment to work better.  Having the support of your family and friends can be a key to making your treatment a success.  If you find that your condition is getting worse, talk to your health care provider right away. This information is not intended to replace advice given to you by your health care provider. Make sure you discuss any questions you have with your health care provider. Document Released: 01/23/2017 Document Revised: 01/15/2019 Document Reviewed: 01/23/2017 Elsevier Patient Education  [place].

## 2019-06-28 NOTE — ED Notes (Signed)
Patient is on the phone with group home, and then He states " they will pick me up, they are going to call yall" nurse let him know that caseworker would talk to them and that this nurse would keep him updated.

## 2019-06-28 NOTE — ED Notes (Signed)
Hourly rounding reveals patient standing in doorway to his room. No complaints, stable, in no acute distress. Q15 minute rounds and monitoring via Engineer, drilling to continue.

## 2019-06-28 NOTE — Social Work (Signed)
CSW was notified by patient's nurse that POA, Jiles Prows, will be picking patient up today.   NP notified  EDP notified  Patient ready for discharge.    [place] [place] [facility] [phone]

## 2019-06-29 ENCOUNTER — Ambulatory Visit: Payer: Medicaid Other | Admitting: Gastroenterology

## 2019-07-05 ENCOUNTER — Encounter: Payer: Self-pay | Admitting: Emergency Medicine

## 2019-07-05 ENCOUNTER — Emergency Department
Admission: EM | Admit: 2019-07-05 | Discharge: 2019-07-06 | Disposition: A | Discharge status: Other Acute Inpatient Hospital | Payer: Medicaid Other | Attending: Emergency Medicine | Admitting: Emergency Medicine

## 2019-07-05 ENCOUNTER — Other Ambulatory Visit: Payer: Self-pay

## 2019-07-05 DIAGNOSIS — D638 Anemia in other chronic diseases classified elsewhere: Secondary | ICD-10-CM | POA: Diagnosis present

## 2019-07-05 DIAGNOSIS — N179 Acute kidney failure, unspecified: Secondary | ICD-10-CM | POA: Diagnosis present

## 2019-07-05 DIAGNOSIS — Z20828 Contact with and (suspected) exposure to other viral communicable diseases: Secondary | ICD-10-CM | POA: Insufficient documentation

## 2019-07-05 DIAGNOSIS — F17290 Nicotine dependence, other tobacco product, uncomplicated: Secondary | ICD-10-CM | POA: Insufficient documentation

## 2019-07-05 DIAGNOSIS — F259 Schizoaffective disorder, unspecified: Secondary | ICD-10-CM | POA: Diagnosis not present

## 2019-07-05 DIAGNOSIS — K529 Noninfective gastroenteritis and colitis, unspecified: Secondary | ICD-10-CM | POA: Diagnosis present

## 2019-07-05 DIAGNOSIS — Z79899 Other long term (current) drug therapy: Secondary | ICD-10-CM | POA: Insufficient documentation

## 2019-07-05 DIAGNOSIS — R748 Abnormal levels of other serum enzymes: Secondary | ICD-10-CM | POA: Diagnosis present

## 2019-07-05 DIAGNOSIS — F250 Schizoaffective disorder, bipolar type: Secondary | ICD-10-CM | POA: Diagnosis not present

## 2019-07-05 DIAGNOSIS — E43 Unspecified severe protein-calorie malnutrition: Secondary | ICD-10-CM | POA: Diagnosis present

## 2019-07-05 DIAGNOSIS — M869 Osteomyelitis, unspecified: Secondary | ICD-10-CM | POA: Diagnosis present

## 2019-07-05 DIAGNOSIS — K5100 Ulcerative (chronic) pancolitis without complications: Secondary | ICD-10-CM | POA: Diagnosis present

## 2019-07-05 DIAGNOSIS — F17210 Nicotine dependence, cigarettes, uncomplicated: Secondary | ICD-10-CM | POA: Diagnosis not present

## 2019-07-05 DIAGNOSIS — I96 Gangrene, not elsewhere classified: Secondary | ICD-10-CM | POA: Diagnosis present

## 2019-07-05 DIAGNOSIS — K91850 Pouchitis: Secondary | ICD-10-CM | POA: Diagnosis present

## 2019-07-05 DIAGNOSIS — R000 Tachycardia, unspecified: Secondary | ICD-10-CM | POA: Diagnosis present

## 2019-07-05 DIAGNOSIS — R634 Abnormal weight loss: Secondary | ICD-10-CM | POA: Diagnosis present

## 2019-07-05 DIAGNOSIS — F4320 Adjustment disorder, unspecified: Secondary | ICD-10-CM | POA: Diagnosis present

## 2019-07-05 DIAGNOSIS — F200 Paranoid schizophrenia: Secondary | ICD-10-CM

## 2019-07-05 LAB — COMPREHENSIVE METABOLIC PANEL
ALT: 15 U/L (ref 0–44)
AST: 17 U/L (ref 15–41)
Albumin: 3.8 g/dL (ref 3.5–5.0)
Alkaline Phosphatase: 72 U/L (ref 38–126)
Anion gap: 8 (ref 5–15)
BUN: 16 mg/dL (ref 6–20)
CO2: 25 mmol/L (ref 22–32)
Calcium: 9 mg/dL (ref 8.9–10.3)
Chloride: 104 mmol/L (ref 98–111)
Creatinine, Ser: 0.98 mg/dL (ref 0.61–1.24)
GFR calc Af Amer: >60 mL/min (ref >60)
GFR calc non Af Amer: >60 mL/min (ref >60)
Glucose, Bld: 84 mg/dL (ref 70–99)
Potassium: 3.8 mmol/L (ref 3.5–5.1)
Sodium: 137 mmol/L (ref 135–145)
Total Bilirubin: 0.5 mg/dL (ref 0.3–1.2)
Total Protein: 9.1 g/dL — ABNORMAL HIGH (ref 6.5–8.1)

## 2019-07-05 LAB — SARS CORONAVIRUS 2 BY RT PCR (HOSPITAL ORDER, PERFORMED IN ~~LOC~~ HOSPITAL LAB): SARS Coronavirus 2: NEGATIVE

## 2019-07-05 LAB — ETHANOL: Alcohol, Ethyl (B): <10 mg/dL (ref <10)

## 2019-07-05 LAB — CBC WITH DIFFERENTIAL/PLATELET
Abs Immature Granulocytes: 0.05 10*3/uL (ref 0.00–0.07)
Basophils Absolute: 0 10*3/uL (ref 0.0–0.1)
Basophils Relative: 0 %
Eosinophils Absolute: 0 10*3/uL (ref 0.0–0.5)
Eosinophils Relative: 0 %
HCT: 45.5 % (ref 39.0–52.0)
Hemoglobin: 14.5 g/dL (ref 13.0–17.0)
Immature Granulocytes: 1 %
Lymphocytes Relative: 16 %
Lymphs Abs: 1.3 10*3/uL (ref 0.7–4.0)
MCH: 29.7 pg (ref 26.0–34.0)
MCHC: 31.9 g/dL (ref 30.0–36.0)
MCV: 93 fL (ref 80.0–100.0)
Monocytes Absolute: 0.5 10*3/uL (ref 0.1–1.0)
Monocytes Relative: 6 %
Neutro Abs: 6.5 10*3/uL (ref 1.7–7.7)
Neutrophils Relative %: 77 %
Platelets: 278 10*3/uL (ref 150–400)
RBC: 4.89 MIL/uL (ref 4.22–5.81)
RDW: 12.7 % (ref 11.5–15.5)
WBC: 8.5 10*3/uL (ref 4.0–10.5)
nRBC: 0 % (ref 0.0–0.2)

## 2019-07-05 LAB — URINE DRUG SCREEN, QUALITATIVE (ARMC ONLY)
Amphetamines, Ur Screen: NOT DETECTED
Barbiturates, Ur Screen: NOT DETECTED
Benzodiazepine, Ur Scrn: NOT DETECTED
Cannabinoid 50 Ng, Ur ~~LOC~~: NOT DETECTED
Cocaine Metabolite,Ur ~~LOC~~: NOT DETECTED
MDMA (Ecstasy)Ur Screen: NOT DETECTED
Methadone Scn, Ur: NOT DETECTED
Opiate, Ur Screen: NOT DETECTED
Phencyclidine (PCP) Ur S: NOT DETECTED
Tricyclic, Ur Screen: NOT DETECTED

## 2019-07-05 MED ORDER — PALIPERIDONE ER 6 MG PO TB24: 6.0000 mg | ORAL_TABLET | Freq: Every day | ORAL | Status: DC

## 2019-07-05 MED ORDER — MELATONIN 3 MG PO TABS: 3.0000 mg | ORAL_TABLET | Freq: Every day | ORAL | Status: DC

## 2019-07-05 MED ORDER — BENZTROPINE MESYLATE 1 MG PO TABS
1.0000 mg | ORAL_TABLET | Freq: Every day | ORAL | Status: DC
  Administered 2019-07-05: 22:00:00 1 mg via ORAL
  Filled 2019-07-05: qty 1

## 2019-07-05 MED ORDER — ATENOLOL 25 MG PO TABS: 12.5000 mg | ORAL_TABLET | Freq: Every day | ORAL | Status: DC

## 2019-07-05 MED ORDER — MIRTAZAPINE 15 MG PO TABS
15.0000 mg | ORAL_TABLET | Freq: Every day | ORAL | Status: DC
  Administered 2019-07-05: 22:00:00 15 mg via ORAL
  Filled 2019-07-05: qty 1

## 2019-07-05 NOTE — ED Triage Notes (Addendum)
Arrives with officer on IVC, states people have been threatening to hurt him. Denies thoughts of harming self. Flight of ideas and rapid speech. IVC papers list aggressive behavior at home however patient is cooperative in triage.

## 2019-07-05 NOTE — ED Notes (Signed)
Hourly rounding reveals patient in room. No complaints, stable, in no acute distress. Q15 minute rounds and monitoring via Engineer, drilling to continue.

## 2019-07-05 NOTE — ED Notes (Signed)
Hourly rounding reveals patient sleeping in room. No complaints, stable, in no acute distress. Q15 minute rounds and monitoring via Engineer, drilling to continue.

## 2019-07-05 NOTE — BH Assessment (Addendum)
Assessment Note  Christian Lamb is an 43 y.o. male who presents to the ER due to his caretaker unable to manage his current behaviors. Per the caretaker Legrand Como Russell-[phone]), his outpatient psychiatrist changes his medications to oral pills instead of injections and now he's refusing to take them. He hasn't had his medications for approximately seven days. He also reports the patient have become verbally aggressive and it's increasing. He now starting arguments with children in the neighborhood. Law enforcement have been to the house several times and patient continues refuse medications. He's telling law enforcement he's taking the medications but in fact he isn't.  Per the report of the patient, he was brought to the ER because he is a cancer and mercury poison survivor. During the interview, he was hyper-verbal and tangential. Patient was able to be redirected. He was pleasant, cooperative and polite. He reports of having Dementia and Alzheimer because he been in the house "for a lot of days." However, patient does not have dementia or alzheimer. He also reports of having other medical problems because he is single, which are also not true.  Patient admits to been paranoid and believes others and his caregiver are trying to harm him. This is new for the patient.  Patient currently receives outpatient treatment with [facility].  Diagnosis: Scizoaffective D/O  Past Medical History:  Past Medical History:  Diagnosis Date  . Anemia   . Anxiety   . Osteomyelitis ([place]) 01/2018   RIGHT FOOT  . Schizophrenia ([place])   . Ulcerative colitis [facility])     Past Surgical History:  Procedure Laterality Date  . AMPUTATION Right 01/18/2018   Procedure: AMPUTATION OF TOES;  Surgeon: Newt Minion, MD;  Location: [place];  Service: Orthopedics;  Laterality: Right;  . POUCHOSCOPY N/A 05/06/2019   Procedure: POUCHOSCOPY;  Surgeon: Lin Landsman, MD;  Location: [facility] ENDOSCOPY;  Service:  Gastroenterology;  Laterality: N/A;  . SMALL INTESTINE SURGERY  2016   in [place]  . TOTAL COLECTOMY  2003    Family History:  Family History  Problem Relation Age of Onset  . Colon cancer Father 10  . Stomach cancer Neg Hx     Social History:  reports that he has been smoking cigarettes and cigars. He has been smoking about 0.25 packs per day. He has never used smokeless tobacco. He reports that he does not drink alcohol or use drugs.  Additional Social History:  Alcohol / Drug Use Pain Medications: See PTA Prescriptions: See PTA Over the Counter: See PTA History of alcohol / drug use?: No history of alcohol / drug abuse Longest period of sobriety (when/how long): Reports no use  CIWA: CIWA-Ar BP: 97/66 Pulse Rate: (!) 110 COWS:    Allergies: No Known Allergies  Home Medications: (Not in a hospital admission)   OB/GYN Status:  No LMP for male patient.  General Assessment Data Location of Assessment: [facility] ED TTS Assessment: In system Is this a Tele or Face-to-Face Assessment?: Face-to-Face Is this an Initial Assessment or a Re-assessment for this encounter?: Initial Assessment Language Other than English: No Living Arrangements: Other (Comment) What gender do you identify as?: Male Marital status: Single Pregnancy Status: No Living Arrangements: Other (Comment)(Lives with family friend) Can pt return to current living arrangement?: Yes Admission Status: Involuntary Petitioner: Family member Is patient capable of signing voluntary admission?: No(Under IVC) Referral Source: Self/Family/Friend Insurance type: Medicaid  Medical Screening Exam ([place]) Medical Exam completed: Yes  [place]  Living Arrangements: Other (Comment)(Lives with family friend) Legal Guardian: Other:(Self) Name of Psychiatrist: [facility] Name of Therapist: [facility]  Education Status Is patient currently in school?: No Is the patient employed, unemployed  or receiving disability?: Unemployed, Receiving disability income  Risk to self with the past 6 months Suicidal Ideation: No Has patient been a risk to self within the past 6 months prior to admission? : No Suicidal Intent: No Has patient had any suicidal intent within the past 6 months prior to admission? : No Is patient at risk for suicide?: No Suicidal Plan?: No Has patient had any suicidal plan within the past 6 months prior to admission? : No Access to Means: No What has been your use of drugs/alcohol within the last 12 months?: Reports of none Previous Attempts/Gestures: No How many times?: 0 Other Self Harm Risks: Reports of none Triggers for Past Attempts: None known Intentional Self Injurious Behavior: None Family Suicide History: No Recent stressful life event(s): Other (Comment) Persecutory voices/beliefs?: No Depression: Yes Depression Symptoms: Feeling worthless/self pity, Isolating Substance abuse history and/or treatment for substance abuse?: No Suicide prevention information given to non-admitted patients: Not applicable  Risk to Others within the past 6 months Homicidal Ideation: No Does patient have any lifetime risk of violence toward others beyond the six months prior to admission? : No Thoughts of Harm to Others: No Current Homicidal Intent: No Current Homicidal Plan: No Access to Homicidal Means: No Identified Victim: Reports of none History of harm to others?: No Assessment of Violence: None Noted Violent Behavior Description: Reports of none Does patient have access to weapons?: No Criminal Charges Pending?: No Does patient have a court date: No Is patient on probation?: No  Psychosis Hallucinations: Auditory Delusions: Somatic  Mental Status Report Appearance/Hygiene: Unremarkable, In scrubs Eye Contact: Good Motor Activity: Freedom of movement, Unremarkable Speech: Rapid, Tangential Level of Consciousness: Alert Mood: Pleasant,  Anxious Affect: Anxious, Appropriate to circumstance Anxiety Level: Minimal Thought Processes: Tangential, Flight of Ideas, Coherent Judgement: Partial Orientation: Person, Place, Time, Situation, Appropriate for developmental age Obsessive Compulsive Thoughts/Behaviors: Moderate  Cognitive Functioning Concentration: Decreased Memory: Recent Intact, Remote Intact Is patient IDD: No Insight: Poor Impulse Control: Fair Appetite: Good Have you had any weight changes? : No Change Sleep: Decreased Total Hours of Sleep: 4 Vegetative Symptoms: None  ADLScreening [facility] Assessment Services) Patient's cognitive ability adequate to safely complete daily activities?: Yes Patient able to express need for assistance with ADLs?: Yes Independently performs ADLs?: Yes (appropriate for developmental age)  Prior Inpatient Therapy Prior Inpatient Therapy: Yes Prior Therapy Dates: 02/2016 Prior Therapy Facilty/Provider(s): [facility] Reason for Treatment: Scizoaffective D/O  Prior Outpatient Therapy Prior Outpatient Therapy: Yes Prior Therapy Dates: Current Prior Therapy [facility]ilty/[facility] Reason for Treatment: Scizoaffective D/O Does patient have an ACCT team?: Yes Does patient have Intensive In-House Services?  : No Does patient have [facility] services? : No Does patient have P4CC services?: No  ADL Screening (condition at time of admission) Patient's cognitive ability adequate to safely complete daily activities?: Yes Is the patient deaf or have difficulty hearing?: No Does the patient have difficulty seeing, even when wearing glasses/contacts?: No Does the patient have difficulty concentrating, remembering, or making decisions?: No Patient able to express need for assistance with ADLs?: Yes Independently performs ADLs?: Yes (appropriate for developmental age) Does the patient have difficulty walking or climbing stairs?: No Weakness of Legs: None Weakness of Arms/Hands:  None  Home Assistive Devices/Equipment Home Assistive Devices/Equipment:  None  Therapy Consults (therapy consults require a physician order) PT Evaluation Needed: No OT Evalulation Needed: No SLP Evaluation Needed: No Abuse/Neglect Assessment (Assessment to be complete while patient is alone) Abuse/Neglect Assessment Can Be Completed: Yes Physical Abuse: Denies Verbal Abuse: Denies Sexual Abuse: Denies Exploitation of patient/patient's resources: Denies Self-Neglect: Denies Values / Beliefs Cultural Requests During Hospitalization: None Spiritual Requests During Hospitalization: None Consults Spiritual Care Consult Needed: No Social Work Consult Needed: No Regulatory affairs officer (For Healthcare) Does Patient Have a Medical Advance Directive?: No       Child/Adolescent Assessment Running Away Risk: Denies(Patient is an adult)  Disposition:  Disposition Initial Assessment Completed for this Encounter: Yes  On Site Evaluation by:   Reviewed with Physician:    Gunnar Fusi MS, LCAS, [facility], [place] Therapeutic Triage Specialist 07/05/2019 11:03 PM

## 2019-07-05 NOTE — ED Notes (Signed)
Patient belongings include head covering, jacket, t shirt, underpants, jeans, belt, 2 pairs of socks, 2 pouches with no cards or money, no phone.

## 2019-07-05 NOTE — ED Provider Notes (Signed)
[facility] Emergency Department Provider Note  ____________________________________________   First MD Initiated Contact with Patient 07/05/19 [id number]     (approximate)  I have reviewed the triage vital signs and the nursing notes.   HISTORY  Chief Complaint IVC    HPI Carnie Bruemmer is a 43 y.o. male  With h/o schizophrenia, UC, here with multiple complaints. Pt was actually just seen and treated in ED for an extended amount of time for aggressive behavior and schizophrenia. Per report from his group home, since leaving he has refused his medications and has become increasingly agitated and aggressive. Today,  he got into a verbal argument with his caretaker and he has been starting arguments with children in the neighborhood. PD has been involved. 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 He is tangential and tells me that he fixes electronics of various things and that he is here because of his memory issues. He is paranoid his caregiver is not giving him the right medications. He denies overt SI, HI, but does endorse +paranoia. Evasive when asked re: hallucinations.  Level 5 caveat invoked as remainder of history, ROS, and physical exam limited due to patient's psychiatric condition.     Past Medical History:  Diagnosis Date  . Anemia   . Anxiety   . Osteomyelitis ([place]) 01/2018   RIGHT FOOT  . Schizophrenia ([place])   . Ulcerative colitis [facility])     Patient Active Problem List   Diagnosis Date Noted  . Adjustment disorder 05/07/2019  . Inflammatory bowel disease 05/07/2019  . Toe osteomyelitis ([place]) 03/05/2018  . Osteomyelitis ([place]) 01/16/2018  . Necrotic toes ([place]) 01/16/2018  . 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 Anemia of chronic disease 12/05/2017  . AKI (acute kidney injury) ([place]) 12/05/2017  . Rhabdomyolysis 12/05/2017  . Elevated liver enzymes 12/05/2017  . Protein-calorie malnutrition,  severe 12/05/2017  . Sepsis ([place]) 12/04/2017  . Loss of weight 03/21/2017  . Chronic diarrhea 03/21/2017  . Ulcerative colitis with complication ([place]) 08/09/1593  . Tobacco use disorder 02/12/2016  . Schizoaffective disorder, bipolar type ([place]) 02/11/2016  . Chronic tachycardia 01/13/2015  . 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 AMPUTATION Right 01/18/2018   Procedure: AMPUTATION OF TOES;  Surgeon: Newt Minion, MD;  Location: [place];  Service: Orthopedics;  Laterality: Right;  . POUCHOSCOPY N/A 05/06/2019   Procedure: POUCHOSCOPY;  Surgeon: Lin Landsman, MD;  Location[facility] ENDOSCOPY;  Service: Gastroenterology;  Laterality: N/A;  . SMALL INTESTINE SURGERY  2016   in [place]  . TOTAL COLECTOMY  2003    Prior to Admission medications   Medication Sig Start Date End Date Taking? Authorizing Provider  atenolol (TENORMIN) 25 MG tablet Take 0.5 tablets (12.5 mg total) by mouth daily. 12/19/17  Yes Emokpae, Courage, MD  benztropine (COGENTIN) 1 MG tablet Take 1 mg by mouth daily. 06/04/19  Yes [provider]  Melatonin 3 MG TABS Take 3 mg by mouth at bedtime.  05/18/19  Yes [provider]  mirtazapine (REMERON) 15 MG tablet Take 15 mg by mouth at bedtime. 06/11/19  Yes [provider]  paliperidone (INVEGA) 6 MG 24 hr tablet Take 1 tablet (6 mg total) by mouth daily. 06/29/19  Yes Patrecia Pour, NP  INVEGA SUSTENNA 156 MG/ML SUSY injection Inject 156 mg into the muscle every 30 (thirty)  days. 06/11/19   [provider]    Allergies Patient has no known allergies.  Family History  Problem Relation Age of Onset  . Colon cancer Father 62  . Stomach cancer Neg Hx     Social History Social History   Tobacco Use  . Smoking status: Current Every Day Smoker    Packs/day: 0.25    Types: Cigarettes, Cigars  . Smokeless tobacco:  Never Used  Substance Use Topics  . Alcohol use: No    Alcohol/week: 0.0 standard drinks  . Drug use: No    Review of Systems  Review of Systems  Unable to perform ROS: Psychiatric disorder  Constitutional: Negative for chills, fatigue and fever.  HENT: Negative for sore throat.   Respiratory: Negative for shortness of breath.   Cardiovascular: Negative for chest pain.  Gastrointestinal: Negative for abdominal pain.  Genitourinary: Negative for flank pain.  Musculoskeletal: Negative for neck pain.  Skin: Negative for rash and wound.  Allergic/Immunologic: Negative for immunocompromised state.  Neurological: Negative for weakness and numbness.  Hematological: Does not bruise/bleed easily.  Psychiatric/Behavioral: Positive for agitation and behavioral problems.     ____________________________________________  PHYSICAL EXAM:      VITAL SIGNS: ED Triage Vitals  Enc Vitals Group     BP 07/05/19 1741 119/84     Pulse Rate 07/05/19 1741 (!) 134     Resp 07/05/19 1741 20     Temp 07/05/19 1741 99 F (37.2 C)     Temp Source 07/05/19 1741 Oral     SpO2 07/05/19 1741 100 %     Weight 07/05/19 1742 140 lb (63.5 kg)     Height 07/05/19 1742 6' 2"  (1.88 m)     Head Circumference --      Peak Flow --      Pain Score 07/05/19 1742 0     Pain Loc --      Pain Edu? --      Excl. in [place]? --      Physical Exam Vitals signs and nursing note reviewed.  Constitutional:      General: He is not in acute distress.    Appearance: He is well-developed.  HENT:     Head: Normocephalic and atraumatic.  Eyes:     Conjunctiva/sclera: Conjunctivae normal.  Neck:     Musculoskeletal: Neck supple.  Cardiovascular:     Rate and Rhythm: Normal rate and regular rhythm.     Heart sounds: Normal heart sounds.  Pulmonary:     Effort: Pulmonary effort is normal. No respiratory distress.     Breath sounds: No wheezing.  Abdominal:     General: There is no distension.  Skin:    General: Skin  is warm.     Capillary Refill: Capillary refill takes less than 2 seconds.     Findings: No rash.  Neurological:     Mental Status: He is alert and oriented to person, place, and time.     Motor: No abnormal muscle tone.  Psychiatric:        Mood and Affect: Affect is labile and angry.        Behavior: Behavior is agitated.        Thought Content: Thought content is paranoid.        Judgment: Judgment is impulsive.       ____________________________________________   LABS (all labs ordered are listed, but only abnormal results are displayed)  Labs Reviewed  COMPREHENSIVE METABOLIC PANEL - Abnormal; Notable  for the following components:      Result Value   Total Protein 9.1 (*)    All other components within normal limits  SARS CORONAVIRUS 2 (HOSPITAL ORDER, [place] [facility])  ETHANOL  URINE DRUG SCREEN, QUALITATIVE ([facility] ONLY)  CBC WITH DIFFERENTIAL/PLATELET    ____________________________________________  EKG: NA ________________________________________  RADIOLOGY All imaging, including plain films, CT scans, and ultrasounds, independently reviewed by me, and interpretations confirmed via formal radiology reads.  ED MD interpretation:   See ED course for personal interpretations.  Official radiology report(s): No results found.  ____________________________________________  PROCEDURES   Procedure(s) performed (including Critical Care):  Procedures  ____________________________________________  INITIAL IMPRESSION / MDM / [place] / ED COURSE  As part of my medical decision making, I reviewed the following data within the electronic MEDICAL RECORD NUMBER Notes from prior ED visits and [place] Controlled Substance Database      *Saim Almanza was evaluated in Emergency Department on 07/06/2019 for the symptoms described in the history of present illness. He was evaluated in the context of the global COVID-19 pandemic, which  necessitated consideration that the patient might be at risk for infection with the SARS-CoV-2 virus that causes COVID-19. Institutional protocols and algorithms that pertain to the evaluation of patients at risk for COVID-19 are in a state of rapid change based on information released by regulatory bodies including the CDC and federal and state organizations. These policies and algorithms were followed during the patient's care in the ED.  Some ED evaluations and interventions may be delayed as a result of limited staffing during the pandemic.*      Medical Decision Making: 43 yo M here with worsening paranoia in setting of medication nonadherence. He seems to be exhibiting increasing aggression and agitation at home and I suspect he may need inpatient treatment. IVC completed. Will consult Psychiatry. Labs reviewed, no apparent medical emergency.  ____________________________________________  FINAL CLINICAL IMPRESSION(S) / ED DIAGNOSES  Final diagnoses:  Paranoid schizophrenia ([place])     MEDICATIONS GIVEN DURING THIS VISIT:  Medications - No data to display   ED Discharge Orders    None       Note:  This document was prepared using Dragon voice recognition software and may include unintentional dictation errors.   Duffy Bruce, MD 07/06/19 [phone]

## 2019-07-05 NOTE — ED Notes (Signed)
Report to include Situation, Background, Assessment, and Recommendations received from Amy B. RN. Patient alert and oriented, warm and dry, in no acute distress. Patient denies SI, HI, AVH and pain. Patient made aware of Q15 minute rounds and Engineer, drilling presence for their safety. Patient instructed to come to me with needs or concerns.

## 2019-07-05 NOTE — Consult Note (Signed)
[facility] Face-to-Face Psychiatry Consult   Reason for Consult: Psychiatric evaluation Referring Physician: Dr. 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 male patient presented to [facility] ED via law enforcement under involuntary commitment status (IVC). During the patient assessment, he states he has paranoia but denies experiencing hallucinations or not taking his medications. The patient admitted to shoving [name] today. "I pushed him a little today. It was not that bad." " He made me mad because he lied on me."  The patient was asked several questions about hearing voices or seeing things that nobody else sees.  He denies experiencing hallucinations. The patient is currently a client of  [facility] team, and the crisis number is [phone]).  The patient states that sometimes he believes his caregiver, [name], is trying to hurt him.  Then he voices, "I know that he will not do anything to harm me." He is more talkative during his assessment than his last visit and kept on repeating the same scenarios. The patient was hospitalized at [facility] about two months ago, and his hospital stay was two weeks in length. The patient is currently on his Invega Injection.   The patient was seen  face-to-face by this provider; the chart reviewed and consulted with Dr. Ellender Hose on 07/05/2019 due to the patient's care. It was discussed with the EDP that the patient would be admitted to the psychiatric inpatient unit. Because he is presenting symptoms or paranoia, some psychosis and aggression to was his caregiver. The patient is alert and oriented x3, calm, cooperative, and mood-congruent with affect on evaluation. The patient does not appear to be responding to internal or external stimuli. He is presenting with delusional thinking and paranoia during this visit. The patient discloses to this provider that he has been having a difficult time being quarantine and admit that it is making him "have problems with his dementia and schizophrenia." The patient denies auditory or visual hallucinations.  He states, "I don't hear no voices." He admits that people are following him, and some people are trying to hurt him.  The patient denies suicidal, homicidal, or self-harm ideations. The patient is presenting with psychotic and paranoid behaviors. During an encounter with the patient, he was able to answer some questions appropriately.The patient-caregiver, [name], collateral was not obtained [phone]). Plan: The patient is a safety risk to self and others and does require psychiatric inpatient admission for stabilization and treatment.  HPI:  Per The EDP; Letroy Vazguez is a 43 y.o. male with schizophrenia who presents under IVC due to allegations of being noncompliant with medications and having hallucinations.  Patient denies any SI or HI.  Patient is cooperative.  He is unsure why he is here himself.  Per patient he gets a shot that he got on time and took  some oral pills that he has not missed.  He denies any auditory visual hallucinations.  Denies any alcohol or drug use.  He denies any other medical complaints.  Past Psychiatric History:  Anxiety Schizophrenia ([place])  Risk to Self:   No Risk to Others:  No Prior Inpatient Therapy:  Yes Prior Outpatient Therapy:  Yes  Past Medical History:  Past Medical History:  Diagnosis Date  . Anemia   . Anxiety   . Osteomyelitis ([place]) 01/2018   RIGHT FOOT  . Schizophrenia ([place])   . Ulcerative colitis [facility])     Past Surgical History:  Procedure Laterality Date  . AMPUTATION Right 01/18/2018   Procedure: AMPUTATION OF TOES;  Surgeon: Newt Minion, MD;  Location: [facility];  Service: Orthopedics;  Laterality: Right;  . POUCHOSCOPY N/A 05/06/2019   Procedure: POUCHOSCOPY;  Surgeon: Lin Landsman, MD;  Location: [facility] ENDOSCOPY;  Service: Gastroenterology;  Laterality: N/A;  . SMALL INTESTINE SURGERY  2016   in [place]  . TOTAL COLECTOMY  2003   Family History:  Family History  Problem Relation Age of Onset  . Colon cancer Father 68  . Stomach cancer Neg Hx    Family Psychiatric  History:  Social History:  Social History   Substance and Sexual Activity  Alcohol Use No  . Alcohol/week: 0.0 standard drinks     Social History   Substance and Sexual Activity  Drug Use No    Social History   Socioeconomic History  . Marital status: Single    Spouse name: Not on file  . Number of children: Not on file  . Years of education: Not on file  . Highest education level: Not on file  Occupational History  . Not on file  Social Needs  . Financial resource strain: Not on file  . Food insecurity    Worry: Not on file    Inability: Not on file  . Transportation needs    Medical: Not on file    Non-medical: Not on file  Tobacco Use  . Smoking status: Current Every Day Smoker    Packs/day: 0.25    Types: Cigarettes, Cigars  . Smokeless tobacco: Never Used  Substance and Sexual Activity  . Alcohol use: No    Alcohol/week: 0.0 standard drinks  . Drug use: No  . Sexual activity: Not Currently  Lifestyle  . Physical activity    Days per week: Not on file    Minutes per session: Not on file  . Stress: Not on  file  Relationships  . Social Herbalist on phone: Not on file    Gets together: Not on file    Attends religious service: Not on file    Active member of club or organization: Not on file    Attends meetings of clubs or organizations: Not on file    Relationship status: Not on file  Other Topics Concern  . Not on file  Social History Narrative  . Not on file   Additional Social History:    Allergies:  No Known Allergies  Labs:  Results for orders placed or performed during the hospital encounter of 07/05/19 (from the past 48 hour(s))  Comprehensive metabolic panel     Status: Abnormal   Collection Time: 07/05/19  6:00 PM  Result Value Ref Range   Sodium 137 135 - 145 mmol/L   Potassium 3.8 3.5 - 5.1 mmol/L   Chloride 104 98 - 111 mmol/L   CO2  25 22 - 32 mmol/L   Glucose, Bld 84 70 - 99 mg/dL   BUN 16 6 - 20 mg/dL   Creatinine, Ser 0.98 0.61 - 1.24 mg/dL   Calcium 9.0 8.9 - 10.3 mg/dL   Total Protein 9.1 (H) 6.5 - 8.1 g/dL   Albumin 3.8 3.5 - 5.0 g/dL   AST 17 15 - 41 U/L   ALT 15 0 - 44 U/L   Alkaline Phosphatase 72 38 - 126 U/L   Total Bilirubin 0.5 0.3 - 1.2 mg/dL   GFR calc non Af Amer >60 >60 mL/min   GFR calc Af Amer >60 >60 mL/min   Anion gap 8 5 - 15    Comment: Performed at [facility], [address], [place], [place] [address]  Ethanol     Status: None   Collection Time: 07/05/19  6:00 PM  Result Value Ref Range   Alcohol, Ethyl (B) <10 <10 mg/dL    Comment: (NOTE) Lowest detectable limit for serum alcohol is 10 mg/dL. For medical purposes only. Performed at [facility], [address] [place], [place] [address]   Urine Drug Screen, Qualitative     Status: None   Collection Time: 07/05/19  6:00 PM  Result Value Ref Range   Tricyclic, Ur Screen NONE DETECTED NONE DETECTED   Amphetamines, Ur Screen NONE DETECTED NONE DETECTED   MDMA (Ecstasy)Ur Screen NONE DETECTED NONE DETECTED   Cocaine Metabolite,Ur [place] NONE  DETECTED NONE DETECTED   Opiate, Ur Screen NONE DETECTED NONE DETECTED   Phencyclidine (PCP) Ur S NONE DETECTED NONE DETECTED   Cannabinoid 50 Ng, Ur [place] NONE DETECTED NONE DETECTED   Barbiturates, Ur Screen NONE DETECTED NONE DETECTED   Benzodiazepine, Ur Scrn NONE DETECTED NONE DETECTED   Methadone Scn, Ur NONE DETECTED NONE DETECTED    Comment: (NOTE) Tricyclics + metabolites, urine    Cutoff 1000 ng/mL Amphetamines + metabolites, urine  Cutoff 1000 ng/mL MDMA (Ecstasy), urine              Cutoff 500 ng/mL Cocaine Metabolite, urine          Cutoff 300 ng/mL Opiate + metabolites, urine        Cutoff 300 ng/mL Phencyclidine (PCP), urine         Cutoff 25 ng/mL Cannabinoid, urine                 Cutoff 50 ng/mL Barbiturates + metabolites, urine  Cutoff 200 ng/mL Benzodiazepine, urine              Cutoff 200 ng/mL Methadone, urine                   Cutoff 300 ng/mL The urine drug screen provides only a preliminary, unconfirmed analytical test result and should not be used for non-medical purposes. Clinical consideration and professional judgment should be applied to any positive drug screen result due to possible interfering substances. A more specific alternate chemical method must be used in order to obtain a confirmed analytical result. Gas chromatography / mass spectrometry (GC/MS) is the preferred confirmat ory method. Performed at [facility], [address] [place], [place] [address]   CBC with Diff     Status: None   Collection Time: 07/05/19  6:00 PM  Result Value Ref Range   WBC 8.5 4.0 - 10.5 K/uL   RBC 4.89 4.22 - 5.81 MIL/uL   Hemoglobin 14.5 13.0 - 17.0 g/dL   HCT 45.5 39.0 -  52.0 %   MCV 93.0 80.0 - 100.0 fL   MCH 29.7 26.0 - 34.0 pg   MCHC 31.9 30.0 - 36.0 g/dL   RDW 12.7 11.5 - 15.5 %   Platelets 278 150 - 400 K/uL   nRBC 0.0 0.0 - 0.2 %   Neutrophils Relative % 77 %   Neutro Abs 6.5 1.7 - 7.7 K/uL   Lymphocytes Relative 16 %   Lymphs Abs 1.3  0.7 - 4.0 K/uL   Monocytes Relative 6 %   Monocytes Absolute 0.5 0.1 - 1.0 K/uL   Eosinophils Relative 0 %   Eosinophils Absolute 0.0 0.0 - 0.5 K/uL   Basophils Relative 0 %   Basophils Absolute 0.0 0.0 - 0.1 K/uL   Immature Granulocytes 1 %   Abs Immature Granulocytes 0.05 0.00 - 0.07 K/uL    Comment: Performed at [facility], [address] [place], [place] [address]    Current Facility-Administered Medications  Medication Dose Route Frequency Provider Last Rate Last Dose  . [START ON 07/06/2019] atenolol (TENORMIN) tablet 12.5 mg  12.5 mg Oral Daily Duffy Bruce, MD      . benztropine (COGENTIN) tablet 1 mg  1 mg Oral Daily Duffy Bruce, MD      . Melatonin TABS 3 mg  3 mg Oral QHS Duffy Bruce, MD      . mirtazapine (REMERON) tablet 15 mg  15 mg Oral QHS Duffy Bruce, MD      . paliperidone (INVEGA) 24 hr tablet 6 mg  6 mg Oral Daily Duffy Bruce, MD       Current Outpatient Medications  Medication Sig Dispense Refill  . atenolol (TENORMIN) 25 MG tablet Take 0.5 tablets (12.5 mg total) by mouth daily. 30 tablet 1  . benztropine (COGENTIN) 1 MG tablet Take 1 mg by mouth daily.    . Melatonin 3 MG TABS Take 3 mg by mouth at bedtime.     . mirtazapine (REMERON) 15 MG tablet Take 15 mg by mouth at bedtime.    . paliperidone (INVEGA) 6 MG 24 hr tablet Take 1 tablet (6 mg total) by mouth daily. 30 tablet 0  . INVEGA SUSTENNA 156 MG/ML SUSY injection Inject 156 mg into the muscle every 30 (thirty) days.      Musculoskeletal: Strength & Muscle Tone: decreased Gait & Station: unsteady Patient leans: N/A  Psychiatric Specialty Exam: Physical Exam  Nursing note and vitals reviewed. Constitutional: He appears well-developed.  Eyes: Pupils are equal, round, and reactive to light.  Neck: Normal range of motion. Neck supple.  Respiratory: Effort normal.  Musculoskeletal:        General: Deformity present.  Neurological: He is alert.  Skin: Skin is warm and  dry.    Review of Systems  Psychiatric/Behavioral: Positive for depression and hallucinations. The patient is nervous/anxious.   All other systems reviewed and are negative.   Blood pressure 97/66, pulse (!) 110, temperature 98.8 F (37.1 C), temperature source Oral, resp. rate 17, height 6' 2"  (1.88 m), weight 63.5 kg, SpO2 98 %.Body mass index is 17.97 kg/m.  General Appearance: Casual  Eye Contact:  Minimal  Speech:  Clear and Coherent and Pressured  Volume:  Increased  Mood:  Anxious, Depressed and Euphoric  Affect:  Congruent, Depressed, Inappropriate and Full Range  Thought Process:  Coherent  Orientation:  Full (Time, Place, and Person)  Thought Content:  Delusions, Paranoid Ideation and Rumination  Suicidal Thoughts:  No  Homicidal Thoughts:  No  Memory:  Immediate;   Fair Recent;   Fair Remote;   Fair  Judgement:  Poor  Insight:  Lacking  Psychomotor Activity:  Decreased  Concentration:  Concentration: Good and Attention Span: Good  Recall:  [employer] of Knowledge:  Fair  Language:  Fair  Akathisia:  Negative  Handed:  Right  AIMS (if indicated):     Assets:  Communication Skills Desire for Improvement Intimacy Social Support  ADL's:  Intact  Cognition:  Impaired,  Mild  Sleep:   Good     Treatment Plan Summary: Medication management and Plan The patient meets criteria for psychiatric inpatient admission.  Disposition: Recommend psychiatric Inpatient admission when medically cleared. Supportive therapy provided about ongoing stressors. Caroline Sauger, NP 07/05/2019 9:36 PM

## 2019-07-06 ENCOUNTER — Other Ambulatory Visit: Payer: Self-pay

## 2019-07-06 ENCOUNTER — Inpatient Hospital Stay
Admission: AD | Admit: 2019-07-06 | Discharge: 2019-07-07 | DRG: 885 | Disposition: A | Discharge status: Home / Self Care | Payer: Medicaid Other | Source: Intra-hospital | Attending: Psychiatry | Admitting: Psychiatry

## 2019-07-06 DIAGNOSIS — F17210 Nicotine dependence, cigarettes, uncomplicated: Secondary | ICD-10-CM | POA: Diagnosis present

## 2019-07-06 DIAGNOSIS — Z9114 Patient's other noncompliance with medication regimen: Secondary | ICD-10-CM | POA: Diagnosis not present

## 2019-07-06 DIAGNOSIS — Z20828 Contact with and (suspected) exposure to other viral communicable diseases: Secondary | ICD-10-CM | POA: Diagnosis present

## 2019-07-06 DIAGNOSIS — F250 Schizoaffective disorder, bipolar type: Secondary | ICD-10-CM | POA: Diagnosis present

## 2019-07-06 MED ORDER — ACETAMINOPHEN 325 MG PO TABS: 650.0000 mg | ORAL_TABLET | Freq: Four times a day (QID) | ORAL | Status: DC | PRN

## 2019-07-06 MED ORDER — BENZTROPINE MESYLATE 1 MG PO TABS
1.0000 mg | ORAL_TABLET | Freq: Every day | ORAL | Status: DC
  Administered 2019-07-06 – 2019-07-07 (×2): 1 mg via ORAL
  Filled 2019-07-06 (×2): qty 1

## 2019-07-06 MED ORDER — HYDROXYZINE HCL 50 MG PO TABS: 50.0000 mg | ORAL_TABLET | Freq: Four times a day (QID) | ORAL | Status: DC | PRN

## 2019-07-06 MED ORDER — ATENOLOL 25 MG PO TABS
12.5000 mg | ORAL_TABLET | Freq: Every day | ORAL | Status: DC
  Administered 2019-07-06: 12.5 mg via ORAL
  Filled 2019-07-06: qty 0.5

## 2019-07-06 MED ORDER — MAGNESIUM HYDROXIDE 400 MG/5ML PO SUSP: 30.0000 mL | Freq: Every day | ORAL | Status: DC | PRN

## 2019-07-06 MED ORDER — MIRTAZAPINE 15 MG PO TABS
15.0000 mg | ORAL_TABLET | Freq: Every day | ORAL | Status: DC
  Administered 2019-07-06: 22:00:00 15 mg via ORAL
  Filled 2019-07-06: qty 1

## 2019-07-06 MED ORDER — ALUM & MAG HYDROXIDE-SIMETH 200-200-20 MG/5ML PO SUSP: 30.0000 mL | ORAL | Status: DC | PRN

## 2019-07-06 MED ORDER — PALIPERIDONE ER 3 MG PO TB24
6.0000 mg | ORAL_TABLET | Freq: Every day | ORAL | Status: DC
  Administered 2019-07-06: 08:00:00 6 mg via ORAL
  Filled 2019-07-06: qty 2

## 2019-07-06 MED ORDER — ATENOLOL 25 MG PO TABS
12.5000 mg | ORAL_TABLET | Freq: Two times a day (BID) | ORAL | Status: DC
  Administered 2019-07-06: 18:00:00 12.5 mg via ORAL
  Filled 2019-07-06 (×2): qty 0.5

## 2019-07-06 MED ORDER — MELATONIN 5 MG PO TABS
2.5000 mg | ORAL_TABLET | Freq: Every day | ORAL | Status: DC
  Administered 2019-07-06: 2.5 mg via ORAL
  Filled 2019-07-06 (×2): qty 0.5

## 2019-07-06 NOTE — Progress Notes (Signed)
Patient was admitted from [facility] emergency department, report received from Dominica Severin, [place]. Skin check was completed with Cristie Hem, RN, no abnormalities or contraband found. Patient did not want to answer assessment questions because it was the middle of the night and he was tired. Patient given education. Patient given support and encouragement to be active in his treatment plan. Patient informed to let staff know if there are any issues or problems on the unit. Patient being monitored Q 15 minutes for safety per unit protocol. Patient remains safe on the unit. Patient oriented to the unit and his room. Patient is without complaint this evening.

## 2019-07-06 NOTE — Plan of Care (Signed)
D- Patient alert and oriented. Patient presented in a preoccupied, but pleasant mood on assessment, frequently asking this writer for his jacket that he doesn't want the strings cut out of. This Probation officer explained to patient if he does not want the strings cut then he can not have the jacket on his person. Patient had to be reoriented a few times as to where he is, he kept asking this Probation officer "this is the psych ward right". Patient denied any signs/symptoms of depression/anxiety. Patient also denied SI, HI, AVH, and pain at this time. Patient had no stated goals for today.  A- Scheduled medications administered to patient, per MD orders. Support and encouragement provided.  Routine safety checks conducted every 15 minutes.  Patient informed to notify staff with problems or concerns.  R- No adverse drug reactions noted. Patient contracts for safety at this time. Patient compliant with medications and treatment plan. Patient receptive, calm, and cooperative. Patient interacts well with others on the unit.  Patient remains safe at this time.  Problem: Education: Goal: Utilization of techniques to improve thought processes will improve Outcome: Progressing Goal: Knowledge of the prescribed therapeutic regimen will improve Outcome: Progressing   Problem: Activity: Goal: Interest or engagement in leisure activities will improve Outcome: Progressing Goal: Imbalance in normal sleep/wake cycle will improve Outcome: Progressing   Problem: Coping: Goal: Coping ability will improve Outcome: Progressing Goal: Will verbalize feelings Outcome: Progressing   Problem: Health Behavior/Discharge Planning: Goal: Ability to make decisions will improve Outcome: Progressing Goal: Compliance with therapeutic regimen will improve Outcome: Progressing   Problem: Role Relationship: Goal: Will demonstrate positive changes in social behaviors and relationships Outcome: Progressing   Problem: Safety: Goal: Ability  to disclose and discuss suicidal ideas will improve Outcome: Progressing Goal: Ability to identify and utilize support systems that promote safety will improve Outcome: Progressing   Problem: Self-Concept: Goal: Will verbalize positive feelings about self Outcome: Progressing Goal: Level of anxiety will decrease Outcome: Progressing   Problem: Activity: Goal: Will verbalize the importance of balancing activity with adequate rest periods Outcome: Progressing   Problem: Education: Goal: Will be free of psychotic symptoms Outcome: Progressing Goal: Knowledge of the prescribed therapeutic regimen will improve Outcome: Progressing   Problem: Coping: Goal: Coping ability will improve Outcome: Progressing Goal: Will verbalize feelings Outcome: Progressing   Problem: Health Behavior/Discharge Planning: Goal: Compliance with prescribed medication regimen will improve Outcome: Progressing   Problem: Nutritional: Goal: Ability to achieve adequate nutritional intake will improve Outcome: Progressing   Problem: Role Relationship: Goal: Ability to communicate needs accurately will improve Outcome: Progressing Goal: Ability to interact with others will improve Outcome: Progressing   Problem: Safety: Goal: Ability to redirect hostility and anger into socially appropriate behaviors will improve Outcome: Progressing Goal: Ability to remain free from injury will improve Outcome: Progressing   Problem: Self-Care: Goal: Ability to participate in self-care as condition permits will improve Outcome: Progressing   Problem: Self-Concept: Goal: Will verbalize positive feelings about self Outcome: Progressing   Problem: Education: Goal: Ability to identify and develop effective coping behavior will improve Outcome: Progressing   Problem: Coping: Goal: Communication of feelings regarding changes in body function or appearance will improve Outcome: Progressing   Problem: Health  Behavior/Discharge Planning: Goal: Identification of resources available to assist in meeting health care needs will improve Outcome: Progressing   Problem: Self-Concept: Goal: Expressions of positive opinion of body image will increase Outcome: Progressing Goal: Will verbalize positive feelings about self Outcome: Progressing   Problem: Education:  Goal: Ability to state activities that reduce stress will improve Outcome: Progressing   Problem: Coping: Goal: Ability to identify and develop effective coping behavior will improve Outcome: Progressing   Problem: Self-Concept: Goal: Ability to identify factors that promote anxiety will improve Outcome: Progressing Goal: Level of anxiety will decrease Outcome: Progressing Goal: Ability to modify response to factors that promote anxiety will improve Outcome: Progressing

## 2019-07-06 NOTE — Tx Team (Signed)
Initial Treatment Plan 07/06/2019 1:29 AM [name] [id number]    PATIENT STRESSORS: Financial difficulties Health problems Medication change or noncompliance   PATIENT STRENGTHS: Capable of independent living Motivation for treatment/growth Supportive family/friends   PATIENT IDENTIFIED PROBLEMS:   None Compliant With medications    Hyper-verbal    Depression/Anxiety           DISCHARGE CRITERIA:  Improved stabilization in mood, thinking, and/or behavior Medical problems require only outpatient monitoring Motivation to continue treatment in a less acute level of care Reduction of life-threatening or endangering symptoms to within safe limits  PRELIMINARY DISCHARGE PLAN: Participate in family therapy Placement in alternative living arrangements Return to previous work or school arrangements  PATIENT/FAMILY INVOLVEMENT: This treatment plan has been presented to and reviewed with the patient, Christian Lamb,   The patient  have been given the opportunity to ask questions and make suggestions.  Clemens Catholic, RN 07/06/2019, 1:29 AM

## 2019-07-06 NOTE — ED Notes (Signed)
Hourly rounding reveals patient sleeping in room. No complaints, stable, in no acute distress. Q15 minute rounds and monitoring via Engineer, drilling to continue.

## 2019-07-06 NOTE — Progress Notes (Signed)
CSW contacted the patient's [place] Team..  CSW spoke with [place]. Melida Gimenez reports that the patient has been IVC'ed "3 or 4 times in the past month".  She reports that the ACT team visited with patient on 07/05/2019 with no major concerns, though pt appeared to be slightly agitated over cigarettes. She identifies that current living situation for patient appears to be problematic, though the ACT team has only been in place for several weeks.    She confirms that patient is his own guardian and [name] is the payee.  She provided the following numbers: [place], Team Lead, [place] office, [phone] crisis number, [phone]  Assunta Curtis, MSW, LCSW 07/06/2019 10:28 AM
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
## 2019-07-06 NOTE — BHH Suicide Risk Assessment (Signed)
[facility] INPATIENT:  Family/Significant Other Suicide Prevention Education  Suicide Prevention Education:  Contact Attempts: [name], friend, [phone] has been identified by the patient as the family member/significant other with whom the patient will be residing, and identified as the person(s) who will aid the patient in the event of a mental health crisis.  With written consent from the patient, two attempts were made to provide suicide prevention education, prior to and/or following the patient's discharge.  We were unsuccessful in providing suicide prevention education.  A suicide education pamphlet was given to the patient to share with family/significant other.  Date and time of first attempt: 07/06/19 at 10:10AM Date and time of second attempt: Second attempt is needed.   CSW notes that she was unable to leave a voicemail as the voicemail box has not been set up.  Christian Lamb 07/06/2019, 10:09 AM

## 2019-07-06 NOTE — BHH Group Notes (Signed)
  LCSW Group Therapy Note  07/06/2019 12:25 PM   Type of Therapy/Topic:  Group Therapy:  Feelings about Diagnosis  Participation Level:  None   Description of Group:   This group will allow patients to explore their thoughts and feelings about diagnoses they have received. Patients will be guided to explore their level of understanding and acceptance of these diagnoses. Facilitator will encourage patients to process their thoughts and feelings about the reactions of others to their diagnosis and will guide patients in identifying ways to discuss their diagnosis with significant others in their lives. This group will be process-oriented, with patients participating in exploration of their own experiences, giving and receiving support, and processing challenge from other group members.   Therapeutic Goals: 1. Patient will demonstrate understanding of diagnosis as evidenced by identifying two or more symptoms of the disorder 2. Patient will be able to express two feelings regarding the diagnosis 3. Patient will demonstrate their ability to communicate their needs through discussion and/or role play  Summary of Patient Progress: Pt came to group during the last five minutes but did not participate.   Therapeutic Modalities:   Cognitive Behavioral Therapy Brief Therapy Feelings Identification    Evalina Field, MSW, LCSW Clinical Social Work 07/06/2019 12:25 PM

## 2019-07-06 NOTE — Tx Team (Signed)
Interdisciplinary Treatment and Diagnostic Plan Update  07/06/2019 Time of Session: 9:00AM Murle Otting MRN: [id number]  Principal Diagnosis: <principal problem not specified>  Secondary Diagnoses: Active Problems:   Schizoaffective disorder, bipolar type (HCC)   Current Medications:  Current Facility-Administered Medications  Medication Dose Route Frequency Provider Last Rate Last Dose  . acetaminophen (TYLENOL) tablet 650 mg  650 mg Oral Q6H PRN Caroline Sauger, NP      . alum & mag hydroxide-simeth (MAALOX/MYLANTA) 200-200-20 MG/5ML suspension 30 mL  30 mL Oral Q4H PRN Caroline Sauger, NP      . atenolol (TENORMIN) tablet 12.5 mg  12.5 mg Oral BID Sharma Covert, MD      . benztropine (COGENTIN) tablet 1 mg  1 mg Oral Daily Caroline Sauger, NP   1 mg at 07/06/19 0805  . magnesium hydroxide (MILK OF MAGNESIA) suspension 30 mL  30 mL Oral Daily PRN Caroline Sauger, NP      . Melatonin TABS 2.5 mg  2.5 mg Oral QHS Caroline Sauger, NP      . mirtazapine (REMERON) tablet 15 mg  15 mg Oral QHS Caroline Sauger, NP       PTA Medications: Medications Prior to Admission  Medication Sig Dispense Refill Last Dose  . atenolol (TENORMIN) 25 MG tablet Take 0.5 tablets (12.5 mg total) by mouth daily. 30 tablet 1   . benztropine (COGENTIN) 1 MG tablet Take 1 mg by mouth daily.     Lorayne Bender SUSTENNA 156 MG/ML SUSY injection Inject 156 mg into the muscle every 30 (thirty) days.     . Melatonin 3 MG TABS Take 3 mg by mouth at bedtime.      . mirtazapine (REMERON) 15 MG tablet Take 15 mg by mouth at bedtime.     . paliperidone (INVEGA) 6 MG 24 hr tablet Take 1 tablet (6 mg total) by mouth daily. 30 tablet 0     Patient Stressors: Financial difficulties Health problems Medication change or noncompliance  Patient Strengths: Capable of independent living Motivation for treatment/growth Supportive family/friends  Treatment Modalities: Medication Management,  Group therapy, Case management,  1 to 1 session with clinician, Psychoeducation, Recreational therapy.   Physician Treatment Plan for Primary Diagnosis: <principal problem not specified> Long Term Goal(s):     Short Term Goals:    Medication Management: Evaluate patient's response, side effects, and tolerance of medication regimen.  Therapeutic Interventions: 1 to 1 sessions, Unit Group sessions and Medication administration.  Evaluation of Outcomes: Progressing  Physician Treatment Plan for Secondary Diagnosis: Active Problems:   Schizoaffective disorder, bipolar type ([place])  Long Term Goal(s):     Short Term Goals:       Medication Management: Evaluate patient's response, side effects, and tolerance of medication regimen.  Therapeutic Interventions: 1 to 1 sessions, Unit Group sessions and Medication administration.  Evaluation of Outcomes: Progressing   RN Treatment Plan for Primary Diagnosis: <principal problem not specified> Long Term Goal(s): Knowledge of disease and therapeutic regimen to maintain health will improve  Short Term Goals: Ability to verbalize frustration and anger appropriately will improve, Ability to demonstrate self-control, Ability to participate in decision making will improve, Ability to verbalize feelings will improve and Ability to identify and develop effective coping behaviors will improve  Medication Management: RN will administer medications as ordered by provider, will assess and evaluate patient's response and provide education to patient for prescribed medication. RN will report any adverse and/or side effects to prescribing provider.  Therapeutic Interventions: 1 on  1 counseling sessions, Psychoeducation, Medication administration, Evaluate responses to treatment, Monitor vital signs and CBGs as ordered, Perform/monitor CIWA, COWS, AIMS and Fall Risk screenings as ordered, Perform wound care treatments as ordered.  Evaluation of Outcomes:  Progressing   LCSW Treatment Plan for Primary Diagnosis: <principal problem not specified> Long Term Goal(s): Safe transition to appropriate next level of care at discharge, Engage patient in therapeutic group addressing interpersonal concerns.  Short Term Goals: Engage patient in aftercare planning with referrals and resources, Increase social support, Increase ability to appropriately verbalize feelings, Increase emotional regulation and Facilitate acceptance of mental health diagnosis and concerns  Therapeutic Interventions: Assess for all discharge needs, 1 to 1 time with Social worker, Explore available resources and support systems, Assess for adequacy in community support network, Educate family and significant other(s) on suicide prevention, Complete Psychosocial Assessment, Interpersonal group therapy.  Evaluation of Outcomes: Progressing   Progress in Treatment: Attending groups: Yes. Participating in groups: No. Taking medication as prescribed: Yes. Toleration medication: Yes. Family/Significant other contact made: Yes, individual(s) contacted:  SPE contact attempt made with [name], the payee. Patient understands diagnosis: Yes. Discussing patient identified problems/goals with staff: Yes. Medical problems stabilized or resolved: Yes. Denies suicidal/homicidal ideation: Yes. Issues/concerns per patient self-inventory: No. Other: none  New problem(s) identified: No, Describe:  none  New Short Term/Long Term Goal(s): elimination of symptoms of psychosis, medication management for mood stabilization; development of comprehensive mental wellness plan.  Patient Goals:  "take my medicine"  Discharge Plan or Barriers: Patient reports plans to return to his home with guardian. CSW did speak with ACT team who reports that the living enviorment is conflictual. CSW has made attempts to call the payee, [name], with whom the patient lives but calls have not been  returned.   Reason for Continuation of Hospitalization: Aggression Hallucinations Medical Issues Medication stabilization  Estimated Length of Stay: 1-7 days  Attendees: Patient: Christian Lamb 07/06/2019 1:51 PM  Physician: Dr. Weber Cooks, MD 07/06/2019 1:51 PM  Nursing: Polly Cobia, RN 07/06/2019 1:51 PM  RN Care Manager: 07/06/2019 1:51 PM  Social Worker: Assunta Curtis, [place] 07/06/2019 1:51 PM  Recreational Therapist:  07/06/2019 1:51 PM  Other: Sanjuana Kava, LCSW 07/06/2019 1:51 PM  Other: Evalina Field, LCSW 07/06/2019 1:51 PM  Other: Marvia Pickles, NP 07/06/2019 1:51 PM    Scribe for Treatment Team: Rozann Lesches, LCSW 07/06/2019 1:51 PM

## 2019-07-06 NOTE — BH Assessment (Signed)
Patient is to be admitted to [facility] by Psychiatric Nurse Practitioner Geni Bers.  Attending Physician will be Dr. Weber Cooks.   Patient has been assigned to room 322, by [facility] Charge Nurse Juliane Lack.   ER staff is aware of the admission:  Dr. Ellender Hose , ER MD   Ozella Almond, Patient's Nurse   Sharyn Lull, Patient Access.

## 2019-07-07 MED ORDER — PALIPERIDONE PALMITATE ER 234 MG/1.5ML IM SUSY: 234.0000 mg | PREFILLED_SYRINGE | Freq: Once | INTRAMUSCULAR | Status: DC

## 2019-07-07 MED ORDER — PALIPERIDONE PALMITATE ER 234 MG/1.5ML IM SUSY: 234.0000 mg | PREFILLED_SYRINGE | Freq: Once | INTRAMUSCULAR | 0 refills | Status: DC

## 2019-07-07 MED ORDER — PALIPERIDONE ER 3 MG PO TB24
6.0000 mg | ORAL_TABLET | Freq: Every day | ORAL | Status: DC
  Administered 2019-07-07: 6 mg via ORAL
  Filled 2019-07-07: qty 2

## 2019-07-07 MED ORDER — HYDROXYZINE HCL 50 MG PO TABS: 50.0000 mg | ORAL_TABLET | Freq: Four times a day (QID) | ORAL | 0 refills | Status: DC | PRN

## 2019-07-07 MED ORDER — PALIPERIDONE ER 6 MG PO TB24: 6.0000 mg | ORAL_TABLET | Freq: Every day | ORAL | 0 refills | Status: DC

## 2019-07-07 NOTE — BHH Group Notes (Signed)
LCSW Group Therapy Note  07/07/2019 1:00 PM  Type of Therapy/Topic:  Group Therapy:  Emotion Regulation  Participation Level:  Did Not Attend   Description of Group:   The purpose of this group is to assist patients in learning to regulate negative emotions and experience positive emotions. Patients will be guided to discuss ways in which they have been vulnerable to their negative emotions. These vulnerabilities will be juxtaposed with experiences of positive emotions or situations, and patients will be challenged to use positive emotions to combat negative ones. Special emphasis will be placed on coping with negative emotions in conflict situations, and patients will process healthy conflict resolution skills.  Therapeutic Goals: 1. Patient will identify two positive emotions or experiences to reflect on in order to balance out negative emotions 2. Patient will label two or more emotions that they find the most difficult to experience 3. Patient will demonstrate positive conflict resolution skills through discussion and/or role plays  Summary of Patient Progress: Patient discharged prior to group.    Therapeutic Modalities:   Cognitive Behavioral Therapy Feelings Identification Dialectical Behavioral Therapy  Assunta Curtis, MSW, LCSW 07/07/2019 12:20 PM

## 2019-07-07 NOTE — Plan of Care (Signed)
  Problem: Education: Goal: Will be free of psychotic symptoms Outcome: Not Progressing  Patient appears responding to internal stimuli.

## 2019-07-07 NOTE — Progress Notes (Signed)
CSW spoke with [facility] [phone] and informed them that pt will be discharging today. ACTT team reported they would follow up with pt this week.  Evalina Field, MSW, LCSW Clinical Social Work 07/07/2019 10:28 AM

## 2019-07-07 NOTE — Progress Notes (Signed)
Pt denies SI, HI and AVH. Pt was educated on d/c plan and verbalizes understanding. Pt received belongings, prescriptions and d/c packet. Collier Bullock RN
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
## 2019-07-07 NOTE — Progress Notes (Signed)
Recreation Therapy Notes  Date: 07/07/2019  Time: 9:30 am   Location: Craft room  Behavioral response: Appropriate   Intervention Topic: Emotions  Discussion/Intervention:  Group content on today was focused on emotions. The group identified what emotions are and why it is important to have emotions. Patients expressed some positive and negative emotions. Individuals gave some past experiences on how they normally dealt with emotions in the past. The group described some positive ways to deal with emotions in the future. Patients participated in the intervention "Name the Christian Lamb" where individuals were given a chance to experience different emotions.  Clinical Observations/Feedback:  Patient came to group and was focused on what peers and staff had to say about emotions.Individual left group early due to unknown reasons.   Christian Lamb LRT/CTRS         Christian Lamb 07/07/2019 11:12 AM

## 2019-07-07 NOTE — BHH Suicide Risk Assessment (Signed)
[facility] Discharge Suicide Risk Assessment   Principal Problem: Schizoaffective disorder, bipolar type [facility]) Discharge Diagnoses: Principal Problem:   Schizoaffective disorder, bipolar type ([place])   Total Time spent with patient: 30 minutes  Musculoskeletal: Strength & Muscle Tone: within normal limits Gait & Station: broad based Patient leans: N/A  Psychiatric Specialty Exam: Review of Systems  All other systems reviewed and are negative.   Blood pressure 97/72, pulse 87, temperature 97.6 F (36.4 C), temperature source Oral, resp. rate 17, height 5' 10"  (1.778 m), weight 59.4 kg, SpO2 100 %.Body mass index is 18.8 kg/m.  General Appearance: Disheveled  Eye Sport and exercise psychologist::  Fair  Speech:  Normal Rate409  Volume:  Increased  Mood:  Irritable  Affect:  Congruent  Thought Process:  Coherent and Descriptions of Associations: Intact  Orientation:  Full (Time, Place, and Person)  Thought Content:  Logical  Suicidal Thoughts:  No  Homicidal Thoughts:  No  Memory:  Immediate;   Fair Recent;   Fair Remote;   Fair  Judgement:  Fair  Insight:  Fair  Psychomotor Activity:  Increased  Concentration:  Fair  Recall:  [employer] of Knowledge:Fair  Language: Fair  Akathisia:  Negative  Handed:  Right  AIMS (if indicated):     Assets:  Desire for Improvement Housing Resilience Transportation  Sleep:  Number of Hours: 6.75  Cognition: WNL  ADL's:  Intact   Mental Status Per Nursing Assessment::   On Admission:  NA  Demographic Factors:  Male, Low socioeconomic status and Unemployed  Loss Factors: NA  Historical Factors: Impulsivity  Risk Reduction Factors:   Living with another person, especially a relative  Continued Clinical Symptoms:  Bipolar Disorder:   Depressive phase Schizophrenia:   Depressive state  Cognitive Features That Contribute To Risk:  Closed-mindedness    Suicide Risk:  Minimal: No identifiable suicidal ideation.  Patients presenting with no risk  factors but with morbid ruminations; may be classified as minimal risk based on the severity of the depressive symptoms  Follow-up [facility] Follow up on 07/08/2019.   Why: Your ACTT team will follow up with you on 07/08/2019. Thank You! Contact information: [address] [place] [address] [phone]           Plan Of Care/Follow-up recommendations:  Activity:  ad lib  Sharma Covert, MD 07/07/2019, 10:59 AM

## 2019-07-07 NOTE — Progress Notes (Signed)
  [facility] Adult Case Management Discharge Plan :  Will you be returning to the same living situation after discharge:  Yes,  home At discharge, do you have transportation home?: Yes,  caretaker will pick pt up at 11:30 Do you have the ability to pay for your medications: Yes,  medicaid  Release of information consent forms completed and in the chart;   Patient to Follow up at: Follow-up [facility] Follow up on 07/08/2019.   Why: Your ACTT team will follow up with you on 07/08/2019. Thank You! Contact information: [address] [place] [address] [phone]           Next level of care provider has access to [place] and Suicide Prevention discussed: Yes,  SPE completed with pts caretaker [name]     Has patient been referred to the Quitline?: Patient refused referral  Patient has been referred for addiction treatment: [place], LCSW 07/07/2019, 10:03 AM

## 2019-07-07 NOTE — Progress Notes (Signed)
Patient alert and oriented x 2 with periods of confusion to time and situation. Patient's affect is blunted he was irritable when he woke up from sleep,  demanding staff to get him pizza and beer all sorts of different kinds of food and stated "if l don't get my food I am going to raise hell" patient was advised not to be disruptive on the unit, he was given food options and writer set limits with patient. Patient is not receptive to staff, his thoughts are disorganized , patient is intrusive, he appears fixated on discharge from the unit, writer explained that was up to the MD. Patient is complaint with medication regimen, 15 minutes safety checks maintained will continue to monitor

## 2019-07-07 NOTE — BHH Suicide Risk Assessment (Signed)
[place] INPATIENT:  Family/Significant Other Suicide Prevention Education  Suicide Prevention Education:  Education Completed; [name], friend, [phone] has been identified by the patient as the family member/significant other with whom the patient will be residing, and identified as the person(s) who will aid the patient in the event of a mental health crisis (suicidal ideations/suicide attempt).  With written consent from the patient, the family member/significant other has been provided the following suicide prevention education, prior to the and/or following the discharge of the patient.  The suicide prevention education provided includes the following:  Suicide risk factors  Suicide prevention and interventions  National Suicide Hotline telephone number  [facility] assessment telephone number  [facility] Emergency Assistance [place] and/or Residential Mobile Crisis Unit telephone number  Request made of family/significant other to:  Remove weapons (e.g., guns, rifles, knives), all items previously/currently identified as safety concern.    Remove drugs/medications (over-the-counter, prescriptions, illicit drugs), all items previously/currently identified as a safety concern.  The family member/significant other verbalizes understanding of the suicide prevention education information provided.  The family member/significant other agrees to remove the items of safety concern listed above.  CSW spoke with pts friend and care taker, [name]. Legrand Como reported that pt won't take the medications prescribed and he gets aggressive and makes suicidal statements. Per Legrand Como, he set up appointments with pts PCP and got pt a therapy appointment with [facility] tomorrow via telephone. Legrand Como denies concerns for SI/HI, reports there are no guns/weapons in the home and stated he will pick pt up at 11:30 today.  [place] MSW LCSW 07/07/2019,  9:45 AM

## 2019-07-07 NOTE — Plan of Care (Addendum)
Pt denies depression, anxiety, SI, HI and AVH. Pt was educated on care plan and verbalizes understanding. Collier Bullock RN Problem: Education: Goal: Utilization of techniques to improve thought processes will improve Outcome: Adequate for Discharge Goal: Knowledge of the prescribed therapeutic regimen will improve Outcome: Adequate for Discharge   Problem: Activity: Goal: Interest or engagement in leisure activities will improve Outcome: Adequate for Discharge Goal: Imbalance in normal sleep/wake cycle will improve Outcome: Adequate for Discharge   Problem: Coping: Goal: Coping ability will improve Outcome: Adequate for Discharge Goal: Will verbalize feelings Outcome: Adequate for Discharge   Problem: Health Behavior/Discharge Planning: Goal: Ability to make decisions will improve Outcome: Adequate for Discharge Goal: Compliance with therapeutic regimen will improve Outcome: Adequate for Discharge   Problem: Role Relationship: Goal: Will demonstrate positive changes in social behaviors and relationships Outcome: Adequate for Discharge   Problem: Safety: Goal: Ability to disclose and discuss suicidal ideas will improve Outcome: Adequate for Discharge Goal: Ability to identify and utilize support systems that promote safety will improve Outcome: Adequate for Discharge   Problem: Self-Concept: Goal: Will verbalize positive feelings about self Outcome: Adequate for Discharge Goal: Level of anxiety will decrease Outcome: Adequate for Discharge   Problem: Activity: Goal: Will verbalize the importance of balancing activity with adequate rest periods Outcome: Adequate for Discharge   Problem: Education: Goal: Will be free of psychotic symptoms Outcome: Adequate for Discharge Goal: Knowledge of the prescribed therapeutic regimen will improve Outcome: Adequate for Discharge   Problem: Coping: Goal: Coping ability will improve Outcome: Adequate for Discharge Goal: Will  verbalize feelings Outcome: Adequate for Discharge   Problem: Health Behavior/Discharge Planning: Goal: Compliance with prescribed medication regimen will improve Outcome: Adequate for Discharge   Problem: Nutritional: Goal: Ability to achieve adequate nutritional intake will improve Outcome: Adequate for Discharge   Problem: Role Relationship: Goal: Ability to communicate needs accurately will improve Outcome: Adequate for Discharge Goal: Ability to interact with others will improve Outcome: Adequate for Discharge   Problem: Safety: Goal: Ability to redirect hostility and anger into socially appropriate behaviors will improve Outcome: Adequate for Discharge Goal: Ability to remain free from injury will improve Outcome: Adequate for Discharge   Problem: Self-Care: Goal: Ability to participate in self-care as condition permits will improve Outcome: Adequate for Discharge   Problem: Self-Concept: Goal: Will verbalize positive feelings about self Outcome: Adequate for Discharge   Problem: Education: Goal: Ability to identify and develop effective coping behavior will improve Outcome: Adequate for Discharge   Problem: Coping: Goal: Communication of feelings regarding changes in body function or appearance will improve Outcome: Adequate for Discharge   Problem: Health Behavior/Discharge Planning: Goal: Identification of resources available to assist in meeting health care needs will improve Outcome: Adequate for Discharge   Problem: Self-Concept: Goal: Expressions of positive opinion of body image will increase Outcome: Adequate for Discharge Goal: Will verbalize positive feelings about self Outcome: Adequate for Discharge   Problem: Education: Goal: Ability to state activities that reduce stress will improve Outcome: Adequate for Discharge   Problem: Coping: Goal: Ability to identify and develop effective coping behavior will improve Outcome: Adequate for  Discharge   Problem: Self-Concept: Goal: Ability to identify factors that promote anxiety will improve Outcome: Adequate for Discharge Goal: Level of anxiety will decrease Outcome: Adequate for Discharge Goal: Ability to modify response to factors that promote anxiety will improve Outcome: Adequate for Discharge

## 2019-07-12 ENCOUNTER — Emergency Department
Admission: EM | Admit: 2019-07-12 | Discharge: 2019-07-13 | Disposition: A | Discharge status: Home / Self Care | Payer: Medicaid Other | Attending: Emergency Medicine | Admitting: Emergency Medicine

## 2019-07-12 ENCOUNTER — Other Ambulatory Visit: Payer: Self-pay

## 2019-07-12 DIAGNOSIS — F209 Schizophrenia, unspecified: Secondary | ICD-10-CM | POA: Insufficient documentation

## 2019-07-12 DIAGNOSIS — Z79899 Other long term (current) drug therapy: Secondary | ICD-10-CM | POA: Diagnosis not present

## 2019-07-12 DIAGNOSIS — F17210 Nicotine dependence, cigarettes, uncomplicated: Secondary | ICD-10-CM | POA: Diagnosis not present

## 2019-07-12 DIAGNOSIS — Z20828 Contact with and (suspected) exposure to other viral communicable diseases: Secondary | ICD-10-CM | POA: Diagnosis not present

## 2019-07-12 LAB — BASIC METABOLIC PANEL
Anion gap: 8 (ref 5–15)
BUN: 11 mg/dL (ref 6–20)
CO2: 26 mmol/L (ref 22–32)
Calcium: 8.9 mg/dL (ref 8.9–10.3)
Chloride: 103 mmol/L (ref 98–111)
Creatinine, Ser: 0.92 mg/dL (ref 0.61–1.24)
GFR calc Af Amer: >60 mL/min (ref >60)
GFR calc non Af Amer: >60 mL/min (ref >60)
Glucose, Bld: 88 mg/dL (ref 70–99)
Potassium: 3.7 mmol/L (ref 3.5–5.1)
Sodium: 137 mmol/L (ref 135–145)

## 2019-07-12 LAB — CBC
HCT: 44.2 % (ref 39.0–52.0)
Hemoglobin: 14.4 g/dL (ref 13.0–17.0)
MCH: 29.6 pg (ref 26.0–34.0)
MCHC: 32.6 g/dL (ref 30.0–36.0)
MCV: 90.9 fL (ref 80.0–100.0)
Platelets: 233 10*3/uL (ref 150–400)
RBC: 4.86 MIL/uL (ref 4.22–5.81)
RDW: 13 % (ref 11.5–15.5)
WBC: 7.3 10*3/uL (ref 4.0–10.5)
nRBC: 0 % (ref 0.0–0.2)

## 2019-07-12 LAB — SARS CORONAVIRUS 2 BY RT PCR (HOSPITAL ORDER, PERFORMED IN ~~LOC~~ HOSPITAL LAB): SARS Coronavirus 2: NEGATIVE

## 2019-07-12 NOTE — ED Notes (Signed)
Awaiting TTS consult.
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
## 2019-07-12 NOTE — ED Notes (Signed)
covid obtained and sent to lab.

## 2019-07-12 NOTE — ED Notes (Signed)
Hourly rounding reveals patient in room. No complaints, stable, in no acute distress. Q15 minute rounds and monitoring via Security Cameras to continue. 

## 2019-07-12 NOTE — ED Notes (Signed)
Hourly rounding reveals patient sleeping in room. No complaints, stable, in no acute distress. Q15 minute rounds and monitoring via Security Cameras to continue. 

## 2019-07-12 NOTE — ED Triage Notes (Signed)
Pt called for triage. Officer reports that he was at the coffee shop looking for food. Will wait and call patient again for triage.

## 2019-07-12 NOTE — ED Notes (Signed)
No orders placed at this time. Pt not dressed out. RN Tiffany aware
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
## 2019-07-12 NOTE — ED Notes (Signed)
Report to include Situation, Background, Assessment, and Recommendations received from [facility]. Patient alert, warm and dry, in no acute distress. Patient denies SI, HI, AVH and pain. Patient made aware of Q15 minute rounds and security cameras for their safety. Patient instructed to come to me with needs or concerns.

## 2019-07-12 NOTE — ED Notes (Signed)
IVC 

## 2019-07-12 NOTE — ED Notes (Signed)
Pt oriented to [place], verbalizes understanding. Given 1 warm blanket. Contracts for safety.

## 2019-07-12 NOTE — ED Notes (Signed)
Report to receiving nurse Radonna Ricker, [facility] police to escort patient to room 4BHU. Patient to unit via w/c all belongings taken with patient to [facility].

## 2019-07-12 NOTE — ED Notes (Signed)
IVC, pending psych consult

## 2019-07-12 NOTE — ED Triage Notes (Signed)
pt called for triage, no response.

## 2019-07-12 NOTE — ED Provider Notes (Signed)
[facility] Emergency Department Provider Note   ____________________________________________    I have reviewed the triage vital signs and the nursing notes.   HISTORY  Chief Complaint voluntary     HPI Christian Lamb is a 43 y.o. male with a history of schizophrenia who presents with agitation.  Patient reports his power of attorney became upset with him today however refuses to say why.  Denies hallucinations, no SI or HI.  I spoke with power of attorney he reports the patient has stopped taking his medications and is becoming increasingly aggressive and agitated at home, he notes he cannot come back until he is more calm  Past Medical History:  Diagnosis Date  . Anemia   . Anxiety   . Osteomyelitis ([place]) 01/2018   RIGHT FOOT  . Schizophrenia ([place])   . Ulcerative colitis [facility])     Patient Active Problem List   Diagnosis Date Noted  . Adjustment disorder 05/07/2019  . Inflammatory bowel disease 05/07/2019  . Toe osteomyelitis ([place]) 03/05/2018  . Osteomyelitis ([place]) 01/16/2018  . Necrotic toes ([place]) 01/16/2018  . 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 Anemia of chronic disease 12/05/2017  . AKI (acute kidney injury) ([place]) 12/05/2017  . Rhabdomyolysis 12/05/2017  . Elevated liver enzymes 12/05/2017  . Protein-calorie malnutrition, severe 12/05/2017  . Sepsis ([place]) 12/04/2017  . Loss of weight 03/21/2017  . Chronic diarrhea 03/21/2017  . Ulcerative colitis with complication ([place]) 41/93/7902  . Tobacco use disorder 02/12/2016  . Schizoaffective disorder, bipolar type ([place]) 02/11/2016  . Chronic tachycardia 01/13/2015  . 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 AMPUTATION Right 01/18/2018   Procedure: AMPUTATION OF TOES;  Surgeon: Newt Minion, MD;  Location: [place];  Service: Orthopedics;  Laterality: Right;  . POUCHOSCOPY N/A  05/06/2019   Procedure: POUCHOSCOPY;  Surgeon: Lin Landsman, MD;  Location: [facility] ENDOSCOPY;  Service: Gastroenterology;  Laterality: N/A;  . SMALL INTESTINE SURGERY  2016   in [place]  . TOTAL COLECTOMY  2003    Prior to Admission medications   Medication Sig Start Date End Date Taking? Authorizing Provider  atenolol (TENORMIN) 25 MG tablet Take 12.5 mg by mouth daily.  07/09/19  Yes [provider]  benztropine (COGENTIN) 1 MG tablet Take 1 mg by mouth daily. 06/04/19  Yes [provider]  hydrOXYzine (ATARAX/VISTARIL) 50 MG tablet Take 1 tablet (50 mg total) by mouth every 6 (six) hours as needed for anxiety (agitation). 07/07/19  Yes Money, Lowry Ram, FNP  Melatonin 3 MG TABS Take 3 mg by mouth at bedtime.  05/18/19  Yes [provider]  mirtazapine (REMERON) 15 MG tablet Take 15 mg by mouth at bedtime. 06/11/19  Yes [provider]  paliperidone (INVEGA SUSTENNA) 234 MG/1.5ML SUSY injection Inject 234 mg into the muscle once for 1 dose. 07/30/19 07/30/19 Yes Money, Lowry Ram, FNP  paliperidone (INVEGA) 6 MG 24 hr tablet Take 1 tablet (6 mg total) by mouth daily. 07/07/19  Yes Money, Lowry Ram, FNP     Allergies Patient has no known allergies.  Family History  Problem Relation Age of Onset  . Colon cancer Father 18  . Stomach cancer Neg Hx     Social History Social History   Tobacco Use  . Smoking status: Current Every Day Smoker    Packs/day: 0.25    Types: Cigarettes, Cigars  . Smokeless  tobacco: Never Used  Substance Use Topics  . Alcohol use: No    Alcohol/week: 0.0 standard drinks  . Drug use: No    Review of Systems  Constitutional: No fever/chills Eyes: No visual changes.  ENT: No sore throat. Cardiovascular: Denies chest pain. Respiratory: Denies shortness of breath. Gastrointestinal: No abdominal pain.   Genitourinary: Negative for dysuria. Musculoskeletal: Negative for back pain. Skin: Negative for rash. Neurological:  Negative for headaches or weakness   ____________________________________________   PHYSICAL EXAM:  VITAL SIGNS: ED Triage Vitals  Enc Vitals Group     BP 07/12/19 1315 107/75     Pulse Rate 07/12/19 1315 (!) 101     Resp 07/12/19 1315 18     Temp 07/12/19 1315 97.8 F (36.6 C)     Temp src --      SpO2 07/12/19 1315 100 %     Weight 07/12/19 1311 63.5 kg (140 lb)     Height 07/12/19 1311 1.88 m (6' 2" )     Head Circumference --      Peak Flow --      Pain Score 07/12/19 1311 0     Pain Loc --      Pain Edu? --      Excl. in [place]? --     Constitutional: Alert and oriented   Nose: No congestion/rhinnorhea. Mouth/Throat: Mucous membranes are moist.    Cardiovascular: Normal rate, regular rhythm. Grossly normal heart sounds.  Good peripheral circulation. Respiratory: Normal respiratory effort.  No retractions. Lungs CTAB. Gastrointestinal: Soft and nontender. No distention.  No CVA tenderness.  Musculoskeletal: .  Warm and well perfused Neurologic:  Normal speech and language. No gross focal neurologic deficits are appreciated.  Skin:  Skin is warm, dry and intact. No rash noted. Psychiatric: Mood and affect are normal. Speech and behavior are normal.  ____________________________________________   LABS (all labs ordered are listed, but only abnormal results are displayed)  Labs Reviewed  CBC  BASIC METABOLIC PANEL   ____________________________________________  EKG  None ____________________________________________  RADIOLOGY  None ____________________________________________   PROCEDURES  Procedure(s) performed: No  Procedures   Critical Care performed: No ____________________________________________   INITIAL IMPRESSION / ASSESSMENT AND PLAN / ED COURSE  Pertinent labs & imaging results that were available during my care of the patient were reviewed by me and considered in my medical decision making (see chart for details).  Patient with  schizophrenia, stopped taking his medications.  He will require IVC and psychiatry and TTS consultation.  I have filled out IVC forms.    ____________________________________________   FINAL CLINICAL IMPRESSION(S) / ED DIAGNOSES  Final diagnoses:  Schizophrenia, unspecified type ([place])        Note:  This document was prepared using Dragon voice recognition software and may include unintentional dictation errors.   Lavonia Drafts, MD 07/12/19 1540
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
## 2019-07-12 NOTE — BH Assessment (Signed)
Assessment Note  Christian Lamb is an 43 y.o. male. Christian Lamb arrived to the ED by way of transportation by [facility].  He reports, "I don't remember" when asked if he knows why he is here today.  He denied remembering anything that happened today that could have led to him coming to the hospital.  He denied symptoms of depression.  He denied symptoms of anxiety.  He denied having auditory or visual hallucinations.  He denied suicidal or homicidal ideation or intent.  He denied the use of drugs or alcohol.  He denied facing any additional stressors.  He reports medication compliance.     TTS attempted to contact legal guardian Christian Lamb - [phone], [phone]).  He reports that he went to find yellow gloves.  They did not have any at [facility], he yelled at the people.  We went to the [facility], and they did not have any there and he yelled at and cussed at the people.  He then went to the [employer] tree, and then they did not have any and he went off on them.  I told him he could not do that.  He tried to open the door when I put the car in gear. I called the police because I was afraid that he was going to jump out of the car when it is moving.  He has not taken his medication in 4 days.  He has missed his Trazadone, and when he does not take it, he goes off into a rage.  He is also on the Invega injection.   Diagnosis: Schizophrenia  Past Medical History:  Past Medical History:  Diagnosis Date  . Anemia   . Anxiety   . Osteomyelitis ([place]) 01/2018   RIGHT FOOT  . Schizophrenia ([place])   . Ulcerative colitis [facility])     Past Surgical History:  Procedure Laterality Date  . AMPUTATION Right 01/18/2018   Procedure: AMPUTATION OF TOES;  Surgeon: Christian Minion, MD;  Location: [place];  Service: Orthopedics;  Laterality: Right;  . POUCHOSCOPY N/A 05/06/2019   Procedure: POUCHOSCOPY;  Surgeon: Christian Landsman, MD;  Location: [facility] ENDOSCOPY;  Service: Gastroenterology;   Laterality: N/A;  . SMALL INTESTINE SURGERY  2016   in [place]  . TOTAL COLECTOMY  2003    Family History:  Family History  Problem Relation Age of Onset  . Colon cancer Father 48  . Stomach cancer Neg Hx     Social History:  reports that he has been smoking cigarettes and cigars. He has been smoking about 0.25 packs per day. He has never used smokeless tobacco. He reports that he does not drink alcohol or use drugs.  Additional Social History:  Alcohol / Drug Use History of alcohol / drug use?: No history of alcohol / drug abuse  CIWA: CIWA-Ar BP: (!) 104/58 Pulse Rate: 95 COWS:    Allergies: No Known Allergies  Home Medications: (Not in a hospital admission)   OB/GYN Status:  No LMP for male patient.  General Assessment Data Location of Assessment: [facility] ED TTS Assessment: In system Is this a Tele or Face-to-Face Assessment?: Face-to-Face Is this an Initial Assessment or a Re-assessment for this encounter?: Initial Assessment Patient Accompanied by:: N/A Language Other than English: No Living Arrangements: Other (Comment) What gender do you identify as?: Male Marital status: Single Living Arrangements: Other (Comment)(Residential living - ) Can pt return to current living arrangement?: Yes Admission Status: Involuntary Petitioner: Other Is patient capable  of signing voluntary admission?: No Referral Source: Self/Family/Friend Insurance type: Medicaid  Medical Screening Exam ([place]) Medical Exam completed: Yes  Crisis Care Plan Living Arrangements: Other (Comment)(Residential living - ) Legal Guardian: Other:[phone]) Name of Psychiatrist: [place] team Name of Therapist: [facility] ACT[facility] team  Education Status Is patient currently in school?: No Highest grade of school patient has completed: 9th Is the patient employed, unemployed or receiving disability?: Unemployed, Receiving disability income  Risk to self with the past 6  months Suicidal Ideation: No Has patient been a risk to self within the past 6 months prior to admission? : No Suicidal Intent: No Has patient had any suicidal intent within the past 6 months prior to admission? : No Is patient at risk for suicide?: No Suicidal Plan?: No Has patient had any suicidal plan within the past 6 months prior to admission? : No Access to Means: No What has been your use of drugs/alcohol within the last 12 months?: Denied use Previous Attempts/Gestures: No How many times?: 0 Other Self Harm Risks: Denied Triggers for Past Attempts: None known Intentional Self Injurious Behavior: None Family Suicide History: No Recent stressful life event(s): (Denied by patient) Persecutory voices/beliefs?: No Depression: No Depression Symptoms: Feeling angry/irritable Substance abuse history and/or treatment for substance abuse?: No Suicide prevention information given to non-admitted patients: Not applicable  Risk to Others within the past 6 months Homicidal Ideation: No Does patient have any lifetime risk of violence toward others beyond the six months prior to admission? : No Thoughts of Harm to Others: No Current Homicidal Intent: No Current Homicidal Plan: No Access to Homicidal Means: No Identified Victim: None identified History of harm to others?: No Assessment of Violence: None Noted Violent Behavior Description: Denied by patient Does patient have access to weapons?: No Criminal Charges Pending?: No Does patient have a court date: No Is patient on probation?: No  Psychosis Hallucinations: None noted Delusions: None noted  Mental Status Report Appearance/Hygiene: In scrubs, Disheveled Eye Contact: Poor Motor Activity: Unremarkable Speech: Aggressive Level of Consciousness: Alert Mood: Irritable Affect: Irritable Anxiety Level: None Thought Processes: Coherent Judgement: Partial Orientation: Appropriate for developmental age Obsessive Compulsive  Thoughts/Behaviors: None  Cognitive Functioning Concentration: Fair Memory: Unable to Assess Insight: Poor Impulse Control: Poor Appetite: Good Have you had any weight changes? : No Change Sleep: No Change Vegetative Symptoms: None  ADLScreening [facility] Assessment Services) Patient's cognitive ability adequate to safely complete daily activities?: Yes Patient able to express need for assistance with ADLs?: Yes Independently performs ADLs?: Yes (appropriate for developmental age)  Prior Inpatient Therapy Prior Inpatient Therapy: Yes Prior Therapy Dates: 06/2019 and prior Prior Therapy Facilty/Provider(s): [facility], [facility], [facility] Reason for Treatment: Scizoaffective D/O  Prior Outpatient Therapy Prior Outpatient Therapy: Yes Prior Therapy Dates: Current Prior Therapy Facilty/Provider(s): [facility] ACTT team Reason for Treatment: Scizoaffective D/O Does patient have an ACCT team?: Yes Does patient have Intensive In-House Services?  : No Does patient have [facility] services? : No Does patient have P4CC services?: No  ADL Screening (condition at time of admission) Patient's cognitive ability adequate to safely complete daily activities?: Yes Is the patient deaf or have difficulty hearing?: No Does the patient have difficulty seeing, even when wearing glasses/contacts?: No Does the patient have difficulty concentrating, remembering, or making decisions?: Yes Patient able to express need for assistance with ADLs?: Yes Does the patient have difficulty dressing or bathing?: No Independently performs ADLs?: Yes (appropriate for developmental age) Does the patient  have difficulty walking or climbing stairs?: Yes Weakness of Legs: Both Weakness of Arms/Hands: None  Home Assistive Devices/Equipment Home Assistive Devices/Equipment: None          Advance Directives (For Healthcare) Does Patient Have a Medical Advance Directive?: No          Disposition:   Disposition Initial Assessment Completed for this Encounter: Yes  On Site Evaluation by:   Reviewed with Physician:    Elmer Bales 07/12/2019 8:53 PM

## 2019-07-12 NOTE — ED Notes (Signed)
Report to receiving nurse [place] police called for escort over to [facility]

## 2019-07-13 NOTE — ED Notes (Signed)
Hourly rounding reveals patient sleeping in room. No complaints, stable, in no acute distress. Q15 minute rounds and monitoring via Security Cameras to continue. 

## 2019-07-13 NOTE — ED Provider Notes (Signed)
-----------------------------------------   5:56 AM on 07/13/2019 -----------------------------------------   Blood pressure (!) 104/58, pulse 95, temperature 98.6 F (37 C), temperature source Oral, resp. rate 18, height 6' 2"  (1.88 m), weight 63.5 kg, SpO2 100 %.  The patient is sleeping at this time.  There have been no acute events since the last update.  Awaiting disposition plan from Behavioral Medicine and/or Social Work team(s).   Paulette Blanch, MD 07/13/19 [phone]

## 2019-07-13 NOTE — ED Notes (Signed)
Patient in shower

## 2019-07-13 NOTE — ED Notes (Signed)
Pt has come to door several times this morning asking about discharge. Pt family unable to come pick up pt until 1400. Pt has been informed of current status.

## 2019-07-13 NOTE — BH Assessment (Signed)
Patient's caregiver [name] - [phone]) said he will arrive to the ED at 2:00pm to p/u patient upon discharge.   This information was relayed to pt's nurse.

## 2019-07-13 NOTE — ED Notes (Signed)
Dr. Corky Downs made aware of patient being picked up at 12noon today.

## 2019-07-13 NOTE — ED Notes (Signed)
Pt given a cup of sprite.

## 2019-07-13 NOTE — ED Notes (Signed)
IVC/Consult completed/ Pending Disposition

## 2019-07-13 NOTE — ED Notes (Signed)
Hourly rounding reveals patient in day room. No complaints, stable, in no acute distress. Q15 minute rounds and monitoring via [employer] to continue.

## 2019-07-13 NOTE — ED Notes (Signed)
Hourly rounding reveals patient in room. No complaints, stable, in no acute distress. Q15 minute rounds and monitoring via Security Cameras to continue. 

## 2019-07-13 NOTE — Consult Note (Signed)
[facility] Face-to-Face Psychiatry Consult   Reason for Consult: Psychiatric evaluation Referring Physician: Dr. Corky Downs Patient Identification: Christian Lamb MRN:  [id number] Principal Diagnosis: <principal problem not specified> Diagnosis:  Active Problems:   * No active hospital problems. *   Total Time spent with patient: 45 minutes  Subjective: "I think it is my dementia and Alzheimer's that got me like this." Christian Lamb is a 43 y.o. male patient presented to [facility] ED via law enforcement under involuntary commitment status (IVC). During the patient assessment, the patient denies knowing why he is here. The patient does not want to participate in the assessment process. The patient is currently a client of  [place] team, and the crisis number is [phone]). The patient is presently on his Invega Injection.   The patient was seen face-to-face by this provider; the chart reviewed and consulted with Dr. Joan Mayans on 07/12/2019 due to the patient's care. It was discussed with the EDP that the patient does not meet the criteria to be admitted to the psychiatric inpatient unit. Because of this visit to the ED, it is all behavior and not psychiatrically.   The patient is alert and oriented x3, calm, cooperative, and mood-congruent with affect on evaluation. The patient does not appear to be responding to internal or external stimuli. He is not presenting with any delusional or paranoia behaviors.    Collateral was obtained by TTS counselor Ms. Pincus Large; who contact legal guardian [name] - [phone], [phone]).  He reports that he went to find yellow gloves.  They did not have any at [facility], he yelled at the people.  We went to the [facility], and they did not have any there and he yelled at and cussed at the people.  He then went to the [employer] tree, and then they did not have any and he went off on them.  I told him he could not do that.  He tried to open the door when I  put the car in gear. I called the police because I was afraid that he was going to jump out of the car when it is moving.  He has not taken his medication in 4 days.  He has missed his Trazadone, and when he does not take it, he goes off into a rage.   Plan: The patient is not a safety risk to self and others and does require psychiatric inpatient admission for stabilization and treatment.  HPI:  Per The EDP; Christian Lamb is a 43 y.o. male with schizophrenia who presents under IVC due to allegations of being noncompliant with medications and having hallucinations.  Patient denies any SI or HI.  Patient is cooperative.  He is unsure why he is here himself.  Per patient he gets a shot that he got on time and took some oral pills that he has not missed.  He denies any auditory visual hallucinations.  Denies any alcohol or drug use.  He denies any other medical complaints.  Past Psychiatric History:  Anxiety Schizophrenia ([place])  Risk to Self: Suicidal Ideation: No Suicidal Intent: No Is patient at risk for suicide?: No Suicidal Plan?: No Access to Means: No What has been your use of drugs/alcohol within the last 12 months?: Denied use How many times?: 0 Other Self Harm Risks: Denied Triggers for Past Attempts: None known Intentional Self Injurious Behavior: NoneNo Risk to Others: Homicidal Ideation: No Thoughts of Harm to Others: No Current Homicidal Intent: No Current Homicidal Plan: No Access  to Homicidal Means: No Identified Victim: None identified History of harm to others?: No Assessment of Violence: None Noted Violent Behavior Description: Denied by patient Does patient have access to weapons?: No Criminal Charges Pending?: No Does patient have a court date: NoNo Prior Inpatient Therapy: Prior Inpatient Therapy: Yes Prior Therapy Dates: 06/2019 and prior Prior Therapy Facilty/Provider(s): [facility], [facility], [facility] Reason for Treatment: Scizoaffective D/OYes Prior  Outpatient Therapy: Prior Outpatient Therapy: Yes Prior Therapy Dates: Current Prior Therapy Facilty/Provider(s): [place] team Reason for Treatment: Scizoaffective D/O Does patient have an ACCT team?: Yes Does patient have Intensive In-House Services?  : No Does patient have [facility] services? : No Does patient have P4CC services?: NoYes  Past Medical History:  Past Medical History:  Diagnosis Date  . Anemia   . Anxiety   . Osteomyelitis ([place]) 01/2018   RIGHT FOOT  . Schizophrenia ([place])   . Ulcerative colitis [facility])     Past Surgical History:  Procedure Laterality Date  . AMPUTATION Right 01/18/2018   Procedure: AMPUTATION OF TOES;  Surgeon: Newt Minion, MD;  Location: [place];  Service: Orthopedics;  Laterality: Right;  . POUCHOSCOPY N/A 05/06/2019   Procedure: POUCHOSCOPY;  Surgeon: Lin Landsman, MD;  Location: [facility] ENDOSCOPY;  Service: Gastroenterology;  Laterality: N/A;  . SMALL INTESTINE SURGERY  2016   in [place]  . TOTAL COLECTOMY  2003   Family History:  Family History  Problem Relation Age of Onset  . Colon cancer Father 67  . Stomach cancer Neg Hx    Family Psychiatric  History:  Social History:  Social History   Substance and Sexual Activity  Alcohol Use No  . Alcohol/week: 0.0 standard drinks     Social History   Substance and Sexual Activity  Drug Use No    Social History   Socioeconomic History  . Marital status: Single    Spouse name: Not on file  . Number of children: Not on file  . Years of education: Not on file  . Highest education level: Not on file  Occupational History  . Not on file  Social Needs  . Financial resource strain: Not on file  . Food insecurity    Worry: Not on file    Inability: Not on file  . Transportation needs    Medical: Not on file    Non-medical: Not on file  Tobacco Use  . Smoking status: Current Every Day Smoker    Packs/day: 0.25    Types: Cigarettes, Cigars  . Smokeless tobacco: Never  Used  Substance and Sexual Activity  . Alcohol use: No    Alcohol/week: 0.0 standard drinks  . Drug use: No  . Sexual activity: Not Currently  Lifestyle  . Physical activity    Days per week: Not on file    Minutes per session: Not on file  . Stress: Not on file  Relationships  . Social Herbalist on phone: Not on file    Gets together: Not on file    Attends religious service: Not on file    Active member of club or organization: Not on file    Attends meetings of clubs or organizations: Not on file    Relationship status: Not on file  Other Topics Concern  . Not on file  Social History Narrative  . Not on file   Additional Social History:    Allergies:  No Known Allergies  Labs:  Results for orders placed  or performed during the hospital encounter of 07/12/19 (from the past 48 hour(s))  CBC     Status: None   Collection Time: 07/12/19  3:32 PM  Result Value Ref Range   WBC 7.3 4.0 - 10.5 K/uL   RBC 4.86 4.22 - 5.81 MIL/uL   Hemoglobin 14.4 13.0 - 17.0 g/dL   HCT 44.2 39.0 - 52.0 %   MCV 90.9 80.0 - 100.0 fL   MCH 29.6 26.0 - 34.0 pg   MCHC 32.6 30.0 - 36.0 g/dL   RDW 13.0 11.5 - 15.5 %   Platelets 233 150 - 400 K/uL   nRBC 0.0 0.0 - 0.2 %    Comment: Performed at [facility], [address] [place], [place] [address]  Basic metabolic panel     Status: None   Collection Time: 07/12/19  3:32 PM  Result Value Ref Range   Sodium 137 135 - 145 mmol/L   Potassium 3.7 3.5 - 5.1 mmol/L   Chloride 103 98 - 111 mmol/L   CO2 26 22 - 32 mmol/L   Glucose, Bld 88 70 - 99 mg/dL   BUN 11 6 - 20 mg/dL   Creatinine, Ser 0.92 0.61 - 1.24 mg/dL   Calcium 8.9 8.9 - 10.3 mg/dL   GFR calc non Af Amer >60 >60 mL/min   GFR calc Af Amer >60 >60 mL/min   Anion gap 8 5 - 15    Comment: Performed at [facility], [address] [place], [place] [address]  SARS Coronavirus 2 [facility] order, Performed in [facility] hospital lab) Nasopharyngeal  Nasopharyngeal Swab     Status: None   Collection Time: 07/12/19  5:43 PM   Specimen: Nasopharyngeal Swab  Result Value Ref Range   SARS Coronavirus 2 NEGATIVE NEGATIVE    Comment: (NOTE) If result is NEGATIVE SARS-CoV-2 target nucleic acids are NOT DETECTED. The SARS-CoV-2 RNA is generally detectable in upper and lower  respiratory specimens during the acute phase of infection. The lowest  concentration of SARS-CoV-2 viral copies this assay can detect is 250  copies / mL. A negative result does not preclude SARS-CoV-2 infection  and should not be used as the sole basis for treatment or other  patient management decisions.  A negative result may occur with  improper specimen collection / handling, submission of specimen other  than nasopharyngeal swab, presence of viral mutation(s) within the  areas targeted by this assay, and inadequate number of viral copies  (<250 copies / mL). A negative result must be combined with clinical  observations, patient history, and epidemiological information. If result is POSITIVE SARS-CoV-2 target nucleic acids are DETECTED. The SARS-CoV-2 RNA is generally detectable in upper and lower  respiratory specimens dur ing the acute phase of infection.  Positive  results are indicative of active infection with SARS-CoV-2.  Clinical  correlation with patient history and other diagnostic information is  necessary to determine patient infection status.  Positive results do  not rule out bacterial infection or co-infection with other viruses. If result is PRESUMPTIVE POSTIVE SARS-CoV-2 nucleic acids MAY BE PRESENT.   A presumptive positive result was obtained on the submitted specimen  and confirmed on repeat testing.  While 2019 novel coronavirus  (SARS-CoV-2) nucleic acids may be present in the submitted sample  additional confirmatory testing may be necessary for epidemiological  and / or clinical management purposes  to differentiate between  SARS-CoV-2  and other Sarbecovirus currently known to infect humans.  If  clinically indicated additional testing with an alternate test  methodology [phone]) is advised. The SARS-CoV-2 RNA is generally  detectable in upper and lower respiratory sp ecimens during the acute  phase of infection. The expected result is Negative. Fact Sheet for Patients:  StrictlyIdeas.no Fact Sheet for Healthcare Providers: BankingDealers.co.za This test is not yet approved or cleared by the [place] FDA and has been authorized for detection and/or diagnosis of SARS-CoV-2 by FDA under an Emergency Use Authorization (EUA).  This EUA will remain in effect (meaning this test can be used) for the duration of the COVID-19 declaration under Section 564(b)(1) of the Act, 21 U.S.C. section 360bbb-3(b)(1), unless the authorization is terminated or revoked sooner. Performed at [facility], [address] [place], [place] [address]     No current facility-administered medications for this encounter.    Current Outpatient Medications  Medication Sig Dispense Refill  . atenolol (TENORMIN) 25 MG tablet Take 12.5 mg by mouth daily.     . benztropine (COGENTIN) 1 MG tablet Take 1 mg by mouth daily.    . hydrOXYzine (ATARAX/VISTARIL) 50 MG tablet Take 1 tablet (50 mg total) by mouth every 6 (six) hours as needed for anxiety (agitation). 30 tablet 0  . Melatonin 3 MG TABS Take 3 mg by mouth at bedtime.     . mirtazapine (REMERON) 15 MG tablet Take 15 mg by mouth at bedtime.    Derrill Memo ON 07/30/2019] paliperidone (INVEGA SUSTENNA) 234 MG/1.5ML SUSY injection Inject 234 mg into the muscle once for 1 dose. 1.5 mL 0  . paliperidone (INVEGA) 6 MG 24 hr tablet Take 1 tablet (6 mg total) by mouth daily. 30 tablet 0    Musculoskeletal: Strength & Muscle Tone: decreased Gait & Station: unsteady Patient leans: N/A  Psychiatric Specialty Exam: Physical Exam  Nursing note  and vitals reviewed. Constitutional: He appears well-developed.  Eyes: Pupils are equal, round, and reactive to light.  Neck: Normal range of motion. Neck supple.  Respiratory: Effort normal.  Musculoskeletal:        General: Deformity present.  Neurological: He is alert.  Skin: Skin is warm and dry.  Psychiatric: His behavior is normal. Thought content normal.    Review of Systems  Psychiatric/Behavioral: The patient is nervous/anxious.   All other systems reviewed and are negative.   Blood pressure (!) 104/58, pulse 95, temperature 98.6 F (37 C), temperature source Oral, resp. rate 18, height 6' 2"  (1.88 m), weight 63.5 kg, SpO2 100 %.Body mass index is 17.98 kg/m.  General Appearance: Casual  Eye Contact:  Minimal  Speech:  Clear and Coherent  Volume:  Normal  Mood:  Anxious  Affect:  Congruent and Depressed  Thought Process:  Coherent  Orientation:  Full (Time, Place, and Person)  Thought Content:  Paranoid Ideation  Suicidal Thoughts:  No  Homicidal Thoughts:  No  Memory:  Immediate;   Fair Recent;   Fair Remote;   Fair  Judgement:  Poor  Insight:  Lacking  Psychomotor Activity:  Decreased  Concentration:  Concentration: Good and Attention Span: Good  Recall:  [employer] of Knowledge:  Fair  Language:  Fair  Akathisia:  Negative  Handed:  Right  AIMS (if indicated):     Assets:  Communication Skills Desire for Improvement Intimacy Social Support  ADL's:  Intact  Cognition:  Impaired,  Mild  Sleep:   Good     Treatment Plan Summary: Medication management and Plan The patient does not meet  criteria for psychiatric inpatient admission.  Disposition: No evidence of imminent risk to self or others at present.   Patient does not meet criteria for psychiatric inpatient admission. Supportive therapy provided about ongoing stressors. Caroline Sauger, NP 07/13/2019 3:47 AM

## 2019-08-04 ENCOUNTER — Other Ambulatory Visit: Payer: Self-pay

## 2019-08-04 ENCOUNTER — Encounter: Payer: Self-pay | Admitting: *Deleted

## 2019-08-04 DIAGNOSIS — Z046 Encounter for general psychiatric examination, requested by authority: Secondary | ICD-10-CM | POA: Diagnosis not present

## 2019-08-04 DIAGNOSIS — Z20828 Contact with and (suspected) exposure to other viral communicable diseases: Secondary | ICD-10-CM | POA: Diagnosis not present

## 2019-08-04 DIAGNOSIS — R45850 Homicidal ideations: Secondary | ICD-10-CM | POA: Insufficient documentation

## 2019-08-04 DIAGNOSIS — F17210 Nicotine dependence, cigarettes, uncomplicated: Secondary | ICD-10-CM | POA: Insufficient documentation

## 2019-08-04 DIAGNOSIS — Z79899 Other long term (current) drug therapy: Secondary | ICD-10-CM | POA: Diagnosis not present

## 2019-08-04 DIAGNOSIS — F209 Schizophrenia, unspecified: Secondary | ICD-10-CM | POA: Insufficient documentation

## 2019-08-04 DIAGNOSIS — Z89421 Acquired absence of other right toe(s): Secondary | ICD-10-CM | POA: Insufficient documentation

## 2019-08-04 DIAGNOSIS — Z9114 Patient's other noncompliance with medication regimen: Secondary | ICD-10-CM | POA: Diagnosis not present

## 2019-08-04 DIAGNOSIS — R443 Hallucinations, unspecified: Secondary | ICD-10-CM | POA: Diagnosis present

## 2019-08-04 LAB — ACETAMINOPHEN LEVEL: Acetaminophen (Tylenol), Serum: <10 ug/mL — ABNORMAL LOW (ref 10–30)

## 2019-08-04 LAB — COMPREHENSIVE METABOLIC PANEL
ALT: 13 U/L (ref 0–44)
AST: 19 U/L (ref 15–41)
Albumin: 3.5 g/dL (ref 3.5–5.0)
Alkaline Phosphatase: 68 U/L (ref 38–126)
Anion gap: 8 (ref 5–15)
BUN: 13 mg/dL (ref 6–20)
CO2: 24 mmol/L (ref 22–32)
Calcium: 8.9 mg/dL (ref 8.9–10.3)
Chloride: 106 mmol/L (ref 98–111)
Creatinine, Ser: 0.8 mg/dL (ref 0.61–1.24)
GFR calc Af Amer: >60 mL/min (ref >60)
GFR calc non Af Amer: >60 mL/min (ref >60)
Glucose, Bld: 127 mg/dL — ABNORMAL HIGH (ref 70–99)
Potassium: 3.6 mmol/L (ref 3.5–5.1)
Sodium: 138 mmol/L (ref 135–145)
Total Bilirubin: 0.5 mg/dL (ref 0.3–1.2)
Total Protein: 7.8 g/dL (ref 6.5–8.1)

## 2019-08-04 LAB — CBC
HCT: 42.2 % (ref 39.0–52.0)
Hemoglobin: 13.6 g/dL (ref 13.0–17.0)
MCH: 29.2 pg (ref 26.0–34.0)
MCHC: 32.2 g/dL (ref 30.0–36.0)
MCV: 90.8 fL (ref 80.0–100.0)
Platelets: 217 10*3/uL (ref 150–400)
RBC: 4.65 MIL/uL (ref 4.22–5.81)
RDW: 12.3 % (ref 11.5–15.5)
WBC: 8.9 10*3/uL (ref 4.0–10.5)
nRBC: 0 % (ref 0.0–0.2)

## 2019-08-04 LAB — ETHANOL: Alcohol, Ethyl (B): <10 mg/dL (ref <10)

## 2019-08-04 LAB — SALICYLATE LEVEL: Salicylate Lvl: <7 mg/dL (ref 2.8–30.0)

## 2019-08-04 NOTE — ED Notes (Signed)
I necklace with a cross, 1 cell phone, 1 black beenie, 1 blue shirt, 1 white under tee shirt, 1 brown belt, 1 pair white tennis shoes, pair blue socks, 1 wallet, 1 black underwear shorts.

## 2019-08-04 NOTE — ED Triage Notes (Signed)
Pt brought in by [employer].  Pt is IVC.  Pt handcuffed on arrival.  Pt denies drugs or etoh use.  Pt denies SI or HI.  Pt cooperative.

## 2019-08-05 ENCOUNTER — Encounter: Payer: Self-pay | Admitting: Intensive Care

## 2019-08-05 ENCOUNTER — Emergency Department
Admission: EM | Admit: 2019-08-05 | Discharge: 2019-08-06 | Disposition: A | Discharge status: Home / Self Care | Payer: Medicaid Other | Source: Home / Self Care | Attending: Emergency Medicine | Admitting: Emergency Medicine

## 2019-08-05 ENCOUNTER — Emergency Department
Admission: EM | Admit: 2019-08-05 | Discharge: 2019-08-05 | Disposition: A | Discharge status: Home / Self Care | Payer: Medicaid Other | Attending: Emergency Medicine | Admitting: Emergency Medicine

## 2019-08-05 DIAGNOSIS — Z79899 Other long term (current) drug therapy: Secondary | ICD-10-CM | POA: Insufficient documentation

## 2019-08-05 DIAGNOSIS — F203 Undifferentiated schizophrenia: Secondary | ICD-10-CM | POA: Diagnosis present

## 2019-08-05 DIAGNOSIS — F17200 Nicotine dependence, unspecified, uncomplicated: Secondary | ICD-10-CM | POA: Diagnosis present

## 2019-08-05 DIAGNOSIS — A419 Sepsis, unspecified organism: Secondary | ICD-10-CM | POA: Diagnosis present

## 2019-08-05 DIAGNOSIS — N179 Acute kidney failure, unspecified: Secondary | ICD-10-CM | POA: Diagnosis present

## 2019-08-05 DIAGNOSIS — E43 Unspecified severe protein-calorie malnutrition: Secondary | ICD-10-CM | POA: Diagnosis present

## 2019-08-05 DIAGNOSIS — F209 Schizophrenia, unspecified: Secondary | ICD-10-CM | POA: Insufficient documentation

## 2019-08-05 DIAGNOSIS — R634 Abnormal weight loss: Secondary | ICD-10-CM | POA: Diagnosis present

## 2019-08-05 DIAGNOSIS — K529 Noninfective gastroenteritis and colitis, unspecified: Secondary | ICD-10-CM | POA: Diagnosis present

## 2019-08-05 DIAGNOSIS — R748 Abnormal levels of other serum enzymes: Secondary | ICD-10-CM | POA: Diagnosis present

## 2019-08-05 DIAGNOSIS — K51919 Ulcerative colitis, unspecified with unspecified complications: Secondary | ICD-10-CM | POA: Diagnosis present

## 2019-08-05 DIAGNOSIS — M869 Osteomyelitis, unspecified: Secondary | ICD-10-CM | POA: Diagnosis present

## 2019-08-05 DIAGNOSIS — K91850 Pouchitis: Secondary | ICD-10-CM | POA: Diagnosis present

## 2019-08-05 DIAGNOSIS — F250 Schizoaffective disorder, bipolar type: Secondary | ICD-10-CM | POA: Diagnosis present

## 2019-08-05 DIAGNOSIS — R000 Tachycardia, unspecified: Secondary | ICD-10-CM | POA: Diagnosis present

## 2019-08-05 DIAGNOSIS — K5100 Ulcerative (chronic) pancolitis without complications: Secondary | ICD-10-CM | POA: Diagnosis present

## 2019-08-05 DIAGNOSIS — M6282 Rhabdomyolysis: Secondary | ICD-10-CM | POA: Diagnosis present

## 2019-08-05 DIAGNOSIS — I96 Gangrene, not elsewhere classified: Secondary | ICD-10-CM | POA: Diagnosis present

## 2019-08-05 DIAGNOSIS — F17290 Nicotine dependence, other tobacco product, uncomplicated: Secondary | ICD-10-CM | POA: Insufficient documentation

## 2019-08-05 DIAGNOSIS — F17210 Nicotine dependence, cigarettes, uncomplicated: Secondary | ICD-10-CM | POA: Insufficient documentation

## 2019-08-05 DIAGNOSIS — D638 Anemia in other chronic diseases classified elsewhere: Secondary | ICD-10-CM | POA: Diagnosis present

## 2019-08-05 DIAGNOSIS — F4320 Adjustment disorder, unspecified: Secondary | ICD-10-CM | POA: Diagnosis present

## 2019-08-05 LAB — SARS CORONAVIRUS 2 BY RT PCR (HOSPITAL ORDER, PERFORMED IN ~~LOC~~ HOSPITAL LAB): SARS Coronavirus 2: NEGATIVE

## 2019-08-05 LAB — URINE DRUG SCREEN, QUALITATIVE (ARMC ONLY)
Amphetamines, Ur Screen: NOT DETECTED
Barbiturates, Ur Screen: NOT DETECTED
Benzodiazepine, Ur Scrn: NOT DETECTED
Cannabinoid 50 Ng, Ur ~~LOC~~: NOT DETECTED
Cocaine Metabolite,Ur ~~LOC~~: NOT DETECTED
MDMA (Ecstasy)Ur Screen: NOT DETECTED
Methadone Scn, Ur: NOT DETECTED
Opiate, Ur Screen: NOT DETECTED
Phencyclidine (PCP) Ur S: NOT DETECTED
Tricyclic, Ur Screen: NOT DETECTED
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
## 2019-08-05 NOTE — ED Notes (Signed)
First RN Note: Pt presents to ED via [employer] voluntarily, per [employer] pt was found wondering in the road after being taken to the homeless shelter after being discharged from the hospital several hours ago. Per [employer] pt stated to him that the homeless shelter was closed and wouldn't let him in.

## 2019-08-05 NOTE — ED Notes (Signed)
Signature pad not working: Pt unable to sign at this time. Pt verbalizes understanding of DC documents and follow-up appointments.

## 2019-08-05 NOTE — ED Notes (Signed)
Patient on phone

## 2019-08-05 NOTE — ED Provider Notes (Signed)
[facility] Emergency Department Provider Note   ____________________________________________   First MD Initiated Contact with Patient 08/05/19 2358     (approximate)  I have reviewed the triage vital signs and the nursing notes.   HISTORY  Chief Complaint Medical Clearance    HPI Christian Lamb is a 43 y.o. male with schizophrenia discharged from this facility area or after behavioral medicine evaluation.  He was turned away at that homeless shelter because they were full and his cousin refused for him to come back to the residence.  He was found by [facility] police wandering in the road.  Voices no medical complaints.       Past Medical History:  Diagnosis Date  . Anemia   . Anxiety   . Osteomyelitis ([place]) 01/2018   RIGHT FOOT  . Schizophrenia ([place])   . Ulcerative colitis [facility])     Patient Active Problem List   Diagnosis Date Noted  . Adjustment disorder 05/07/2019  . Inflammatory bowel disease 05/07/2019  . Toe osteomyelitis ([place]) 03/05/2018  . Osteomyelitis ([place]) 01/16/2018  . Necrotic toes ([place]) 01/16/2018  . 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 Anemia of chronic disease 12/05/2017  . AKI (acute kidney injury) ([place]) 12/05/2017  . Rhabdomyolysis 12/05/2017  . Elevated liver enzymes 12/05/2017  . Protein-calorie malnutrition, severe 12/05/2017  . Sepsis ([place]) 12/04/2017  . Loss of weight 03/21/2017  . Chronic diarrhea 03/21/2017  . Ulcerative colitis with complication ([place]) 76/22/6333  . Tobacco use disorder 02/12/2016  . Schizoaffective disorder, bipolar type ([place]) 02/11/2016  . Chronic tachycardia 01/13/2015  . 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 AMPUTATION Right 01/18/2018   Procedure: AMPUTATION OF TOES;  Surgeon: Newt Minion, MD;  Location: [place];  Service: Orthopedics;  Laterality: Right;  . POUCHOSCOPY  N/A 05/06/2019   Procedure: POUCHOSCOPY;  Surgeon: Lin Landsman, MD;  Location: [facility] ENDOSCOPY;  Service: Gastroenterology;  Laterality: N/A;  . SMALL INTESTINE SURGERY  2016   in [place]  . TOTAL COLECTOMY  2003    Prior to Admission medications   Medication Sig Start Date End Date Taking? Authorizing Provider  atenolol (TENORMIN) 25 MG tablet Take 12.5 mg by mouth daily.  07/09/19   [provider]  benztropine (COGENTIN) 1 MG tablet Take 1 mg by mouth daily. 06/04/19   [provider]  hydrOXYzine (ATARAX/VISTARIL) 50 MG tablet Take 1 tablet (50 mg total) by mouth every 6 (six) hours as needed for anxiety (agitation). 07/07/19   Money, Lowry Ram, FNP  Melatonin 3 MG TABS Take 3 mg by mouth at bedtime.  05/18/19   [provider]  mirtazapine (REMERON) 15 MG tablet Take 15 mg by mouth at bedtime. 06/11/19   [provider]  paliperidone (INVEGA SUSTENNA) 234 MG/1.5ML SUSY injection Inject 234 mg into the muscle once for 1 dose. 07/30/19 07/30/19  Money, Lowry Ram, FNP  paliperidone (INVEGA) 6 MG 24 hr tablet Take 1 tablet (6 mg total) by mouth daily. 07/07/19   Money, Lowry Ram, FNP    Allergies Patient has no known allergies.  Family History  Problem Relation Age of Onset  . Colon cancer Father 46  . Stomach cancer Neg Hx     Social History Social History   Tobacco Use  . Smoking status: Current Every Day Smoker    Packs/day: 0.25    Types: Cigarettes, Cigars  . Smokeless  tobacco: Never Used  Substance Use Topics  . Alcohol use: No    Alcohol/week: 0.0 standard drinks  . Drug use: No    Review of Systems  Constitutional: No fever/chills Eyes: No visual changes. ENT: No sore throat. Cardiovascular: Denies chest pain. Respiratory: Denies shortness of breath. Gastrointestinal: No abdominal pain.  No nausea, no vomiting.  No diarrhea.  No constipation. Genitourinary: Negative for dysuria. Musculoskeletal: Negative for back pain. Skin:  Negative for rash. Neurological: Negative for headaches, focal weakness or numbness. Psychiatric:  Positive for homelessness.   ____________________________________________   PHYSICAL EXAM:  VITAL SIGNS: ED Triage Vitals  Enc Vitals Group     BP 08/05/19 1905 98/67     Pulse Rate 08/05/19 1905 (!) 103     Resp 08/05/19 1905 14     Temp 08/05/19 1905 98.1 F (36.7 C)     Temp Source 08/05/19 1905 Oral     SpO2 08/05/19 1905 97 %     Weight 08/05/19 1853 175 lb 0.7 oz (79.4 kg)     Height 08/05/19 1853 6' 1"  (1.854 m)     Head Circumference --      Peak Flow --      Pain Score 08/05/19 1853 0     Pain Loc --      Pain Edu? --      Excl. in [place]? --     Constitutional: Alert and oriented. Well appearing and in no acute distress. Eyes: Conjunctivae are normal. PERRL. EOMI. Head: Atraumatic. Nose: No congestion/rhinnorhea. Mouth/Throat: Mucous membranes are moist.  Oropharynx non-erythematous. Neck: No stridor.   Cardiovascular: Normal rate, regular rhythm. Grossly normal heart sounds.  Good peripheral circulation. Respiratory: Normal respiratory effort.  No retractions. Lungs CTAB. Gastrointestinal: Soft and nontender. No distention. No abdominal bruits. No CVA tenderness. Musculoskeletal: No lower extremity tenderness nor edema.  No joint effusions. Neurologic:  Normal speech and language. No gross focal neurologic deficits are appreciated. No gait instability. Skin:  Skin is warm, dry and intact. No rash noted. Psychiatric: Mood and affect are flat. Speech and behavior are normal.  ____________________________________________   LABS (all labs ordered are listed, but only abnormal results are displayed)  Labs Reviewed - No data to display ____________________________________________  EKG  None ____________________________________________  RADIOLOGY  ED MD interpretation: None  Official radiology report(s): No results found.   ____________________________________________   PROCEDURES  Procedure(s) performed (including Critical Care):  Procedures   ____________________________________________   INITIAL IMPRESSION / ASSESSMENT AND PLAN / ED COURSE  As part of my medical decision making, I reviewed the following data within the [place] notes reviewed and incorporated, Labs reviewed, Old chart reviewed, A consult was requested and obtained from this/these consultant(s) Psychiatry and Notes from prior ED visits     Christian Lamb was evaluated in Emergency Department on 08/06/2019 for the symptoms described in the history of present illness. He was evaluated in the context of the global COVID-19 pandemic, which necessitated consideration that the patient might be at risk for infection with the SARS-CoV-2 virus that causes COVID-19. Institutional protocols and algorithms that pertain to the evaluation of patients at risk for COVID-19 are in a state of rapid change based on information released by regulatory bodies including the CDC and federal and state organizations. These policies and algorithms were followed during the patient's care in the ED.    43 year old schizophrenic who had a behavioral medicine evaluation earlier this afternoon and discharged.  He was turned  away from the homeless shelter because they were full and his cousin refused to take him back to his residence.  I have personally reviewed patient's laboratory and urinalysis results from earlier. Will consult TTS and social work for placement.   Clinical Course as of Aug 05 614  Fri Aug 06, 2019  0615 No further events.  Awaiting TTS and social work evaluations this morning.   [JS]    Clinical Course User Index [JS] Paulette Blanch, MD     ____________________________________________   FINAL CLINICAL IMPRESSION(S) / ED DIAGNOSES  Final diagnoses:  Schizophrenia, unspecified type [facility])     ED Discharge  Orders    None       Note:  This document was prepared using Dragon voice recognition software and may include unintentional dictation errors.   Paulette Blanch, MD 08/06/19 [phone]

## 2019-08-05 NOTE — ED Notes (Signed)
Pt up to use restroom to attempt to collect urine specimen.

## 2019-08-05 NOTE — ED Notes (Addendum)
Pt sitting on recliner in from of room 25 at this time.  Awaiting on taxi services to arrive.

## 2019-08-05 NOTE — ED Notes (Addendum)
Pt sitting on recliner at this time. NAD noted at this time. Blanket provide to pt.

## 2019-08-05 NOTE — Social Work (Signed)
Christian Lamb from [facility] ACT team [phone]) contacted CSW and shared that Christian Lamb should no longer be coordinating care for Christian Lamb. Christian Lamb shared that she will be speaking with Christian Lamb on the situation.  CSW was informed by TTS that they have been speaking with Christian Lamb all day regarding Christian Lamb care, and they recommended that Christian Lamb go to homeless shelter. TTS was not informed by Christian Lamb that Christian Lamb was discharged from their care.  CSW shared information with TTS.   Christian Lamb has been medially and psychiatrically cleared for discharge.   [place], [place] ED  [phone]

## 2019-08-05 NOTE — ED Notes (Signed)
Pt provided belongings to dress back into own clothing.  Pt made aware that we will provide a cab voucher to get to [facility].  Pt irritated, states, "I'm not going to the shelter without my things, the cab can take me to my house first."  Pt made aware that cab voucher will be addressed for the Shelter per plan via Social Work, TTS, and his ACT team.
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
## 2019-08-05 NOTE — ED Notes (Signed)
Pt provided with a meal tray and a drink at this time.

## 2019-08-05 NOTE — ED Notes (Signed)
This RN provided with the following information: Casimiro Needle (cousin) [phone] Forest Gleason (aunt): [phone] Maudry Diego or aunt are able to pick up patient.

## 2019-08-05 NOTE — ED Provider Notes (Signed)
Patient seen and cleared for discharge by psychiatry.   Delman Kitten, MD 08/05/19 1549

## 2019-08-05 NOTE — ED Notes (Signed)
Pt provided lunch tray; able to feed self.

## 2019-08-05 NOTE — ED Notes (Signed)
EDT Scott contacted Ladona Mow to check status on transportation. Per rep st transportation is on the way.

## 2019-08-05 NOTE — ED Notes (Signed)
Pt given breakfast tray. Pt eating.

## 2019-08-05 NOTE — ED Notes (Signed)
IVC/Consult completed/ Does not meet criteria for IVC or Inpatient Admit/Pending disposition

## 2019-08-05 NOTE — Discharge Instructions (Signed)
You have been seen in the Emergency Department (ED) today for a psychiatric complaint.  You have been evaluated by psychiatry and we believe you are safe to be discharged from the hospital.    Please return to the ED immediately if you have ANY thoughts of hurting yourself or anyone else, so that we may help you.  Please avoid alcohol and drug use.  Follow up with your doctor and/or therapist as soon as possible regarding today's ED visit.   Please follow up any other recommendations and clinic appointments provided by the psychiatry team that saw you in the Emergency Department.

## 2019-08-05 NOTE — ED Notes (Signed)

## 2019-08-05 NOTE — ED Notes (Signed)
Per MD Paduchowski, no labs need to be redrawn or dressed out
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
## 2019-08-05 NOTE — ED Provider Notes (Signed)
-----------------------------------------   6:16 AM on 08/05/2019 -----------------------------------------   Blood pressure 122/86, pulse (!) 130, temperature 98.6 F (37 C), temperature source Oral, resp. rate 20, height 6' (1.829 m), weight 79.4 kg, SpO2 98 %.  The patient is calm and cooperative at this time.  There have been no acute events since the last update.  Awaiting disposition plan from Behavioral Medicine and/or Social Work team(s).    Nena Polio, MD 08/05/19 [phone]

## 2019-08-05 NOTE — ED Notes (Signed)
[employer] transportation is here to pick up pt. Pt A/Ox4. Ambulatory with a steady gait. NAD noted upon DC. EDT Scott walking with pt to taxy cab at this time.

## 2019-08-05 NOTE — ED Notes (Signed)
Pt. Alert and oriented, warm and dry, in no distress. Pt. Denies SI, HI, and AVH. Patient denies having HI toward his dad and states dad is lying. Patient placed in recliner in hallway between rooms 25 and 26. Pt. Encouraged to let nursing staff know of any concerns or needs.

## 2019-08-05 NOTE — ED Triage Notes (Signed)
Patient discharged from [facility] earlier from [place] for behavioral health. Patient took cab to homeless shelter where he reports he was told they had no room for him. Patient then left and was seen by PD walking up the road. PD took him to [place] who told them to bring him here. Patient reports he normally lives with cousin Casimiro Needle. This RN contacted Casimiro Needle in triage and he relayed [place] cannot come back to his house because we did not put him on medicine he needs to be on to listen to him and stay with him. Ronalee Belts relayed he wants [place] to IVC patient and then he will come pick up patient and take him to [facility]. I tried to explain to Ronalee Belts that is not something we can do. Ronalee Belts refused to come pick up patient. Ronalee Belts then hung up on this RN. Patient denies SI/HI. Denies hallucinations. Patient stated "I do get upset and have outbursts with Ronalee Belts sometimes"
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
## 2019-08-06 NOTE — ED Notes (Signed)
Pt pending social work consult in the am. Is resting with eyes closed at this time without any distress noted. Will continue to monitor.

## 2019-08-06 NOTE — ED Notes (Addendum)
Pt states he was on phone with POA, pt was repeatedly saying to the person on the phone that he was going to stay in his home.Pt states  "Marion Downer said I can stay as long as I wasn't to stay and there is nothing anyone can do about it" "call Marion Downer" "im coming back home today". RN redirected pt off phone and instructed pt to allow social work to speak to him, and handle placement issue.

## 2019-08-06 NOTE — Progress Notes (Signed)
CSW received a call from the pt's RN stating pt's taxi had arrived to take pt home.  CSW called [facility] and updated them pt was D/C'ing home and Dispatch voiced understanding and stated they would update the Corporal the CSW spoke to earlier.  Please reconsult if future social work needs arise.  CSW signing off, as social work intervention is no longer needed.  Alphonse Guild. Thirza Pellicano, LCSW, LCAS, CSI Transitions of Care Clinical Social Worker Care Coordination Department Ph: [phone]
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
## 2019-08-06 NOTE — ED Notes (Signed)
This Rn spoke with SW Roderic Palau [phone] who st taxy voucher is needed to send pt back to cousins house. St cousin is not willing to take pt but [employer] will be present to notify cousin that he cannot evict pt w/o a 30 day notice.

## 2019-08-06 NOTE — ED Notes (Signed)
VOL

## 2019-08-06 NOTE — ED Notes (Signed)
Thi rn spoke with SW jonathan regarding pt DC. This Rn will contact SW to notify when pt is getting DC.

## 2019-08-06 NOTE — ED Notes (Signed)
Report received from [facility] Patient care assumed. Patient/RN introduction complete. Will continue to monitor.

## 2019-08-06 NOTE — Progress Notes (Signed)
CSW called [place] [facility] dispatch and requested that [employer] please attempt to arrive at pt's home prior to or at the time of the pt's arrival home to inform pt's caregiver/cousin that he can't evict the pt without a 30-day notice.   CSW awaiting return call from [place] who is either going to arrive on-scene or call pt's caregiver and RN is awaiting a call from ED CSW.with an update as pt's RN is standing by with a taxi voucher at this time.  CSW will continue to follow for D/C needs.  Alphonse Guild. Rajat Staver, LCSW, LCAS, CSI Transitions of Care Clinical Social Worker Care Coordination Department Ph: [phone]

## 2019-08-06 NOTE — Progress Notes (Signed)
CSW received a call from LE who states that pt and pt's caregiver are well known to [employer] and that the pt's caregiver is well aware that pt's cousin cannot just evict the pt without notice and are standing by should any issues arise and CSW can call them back if necessary.  A corporal with [facility] stated they would attempt to send a deputy in the area if available and if an emergency does not arise that has to be prioritized.  RN was updated and stated ETA for a taxi was for one hour from now.  CSW will continue to follow for D/C needs.  Alphonse Guild. Hutch Rhett, LCSW, LCAS, CSI Transitions of Care Clinical Social Worker Care Coordination Department Ph: [phone]

## 2019-08-06 NOTE — Discharge Instructions (Addendum)
Return to the ER for suicidal ideations or any other concerns.  Follow-up with your act team as needed.

## 2019-08-06 NOTE — ED Notes (Addendum)
This Rn spoke with [employer] taxi cab services. Unknown ETA. This rn provided phone to contact this RN upon arrival.

## 2019-08-06 NOTE — ED Notes (Signed)
SW jonathan called to inform cab is here to pick up the patient.

## 2019-08-06 NOTE — ED Notes (Signed)
Food tray given to pt at this time

## 2019-08-06 NOTE — ED Provider Notes (Signed)
5:17 PM Assumed care for off going team.   Blood pressure 112/82, pulse 97, temperature 97.8 F (36.6 C), temperature source Oral, resp. rate 18, height 6' 1"  (1.854 m), weight 79.4 kg, SpO2 100 %.  See their HPI for full report but in brief patient was already evaluated by TTS and discharged back to homeless shelter but police found him on the street and brought him back.  I personally reevaluated patient he said no SI, HI or auditory visual hallucinations.  I discussed with Roderic Palau the social worker who says that patient has been residing with his cousin/landlord many years now that you are to be evicted he would need a 30-day notice.  Patient feels comfortable going back to the home and Roderic Palau is having police escort him back.  Patient is not had any evidence of aggression here in the ER has been pleasant.      Vanessa Augusta, MD 08/06/19 [phone]

## 2019-08-06 NOTE — ED Notes (Signed)
Pt provided with a warm blanket.

## 2019-08-06 NOTE — ED Notes (Signed)
Pt provided with sandwich tray, pt expresses no further needs at this time

## 2019-08-16 ENCOUNTER — Other Ambulatory Visit: Payer: Self-pay | Admitting: Gastroenterology

## 2019-08-16 ENCOUNTER — Ambulatory Visit (INDEPENDENT_AMBULATORY_CARE_PROVIDER_SITE_OTHER): Payer: Medicaid Other | Admitting: Gastroenterology

## 2019-08-16 ENCOUNTER — Other Ambulatory Visit: Payer: Self-pay

## 2019-08-16 ENCOUNTER — Encounter: Payer: Self-pay | Admitting: Gastroenterology

## 2019-08-16 VITALS — BP 112/75 | HR 94 | Temp 98.3°F | Resp 18 | Ht 71.0 in | Wt 134.8 lb

## 2019-08-16 DIAGNOSIS — Z8719 Personal history of other diseases of the digestive system: Secondary | ICD-10-CM | POA: Diagnosis not present

## 2019-08-16 DIAGNOSIS — Z9049 Acquired absence of other specified parts of digestive tract: Secondary | ICD-10-CM

## 2019-08-16 DIAGNOSIS — K91850 Pouchitis: Secondary | ICD-10-CM

## 2019-08-16 MED ORDER — CIPROFLOXACIN HCL 500 MG PO TABS: 500.0000 mg | ORAL_TABLET | Freq: Two times a day (BID) | ORAL | 2 refills | Status: AC

## 2019-08-16 MED ORDER — METRONIDAZOLE 500 MG PO TABS: 500.0000 mg | ORAL_TABLET | Freq: Two times a day (BID) | ORAL | 2 refills | Status: AC

## 2019-08-16 NOTE — Progress Notes (Signed)
[name], MD [address]  [place]  [place], [place] [address]  Main: [phone]  Fax: [phone]    Gastroenterology Consultation  Referring Provider:     Inc, [place] Physician:  Inc, [place] Primary Gastroenterologist:  Dr. Centre Cellar Reason for Consultation:     Unintentional weight loss, chronic diarrhea        HPI:   Christian Lamb is a 43 y.o. male referred by Dr. Alison Stalling, [facility]  for consultation & management of unintentional weight loss, chronic diarrhea.  Patient has history of chronic tobacco use, history of ulcerative colitis, diagnosed in 01/2000 at [facility] after colonoscopy, underwent total proctocolectomy on 12/14/2001, with ileal pouch anal anastomosis by Dr. Harlon Ditty on 02/08/2002.  Patient does not remember what medical treatment he received at the time of initial diagnosis.  Patient is accompanied by his uncle who is his healthcare power of attorney.  Patient was seen by [facility] gastroenterology in 03/2017 for chronic diarrhea and weight loss.  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  He was started on budesonide and patient reports that he symptomatically felt better.  After that, he was lost to follow-up.  He was admitted to [facility] in 12/2017 secondary to sepsis for which he was treated.  He also received budesonide, cholestyramine and Lomotil at that time, he has not seen GI since then.  He lost about 15 pounds in last 1 year, has abdominal cramps, continues to have nonbloody diarrhea associated with incontinence.  He developed severe failure to thrive.  His uncle who is his healthcare power of attorney reports that he smokes, sleeps all day and does not eat at all.  He is currently not on budesonide.  Patient currently lives with his uncle  Labs from 1 year ago revealed mild anemia, hemoglobin 11.9, albumin 2.   His HIV is nonreactive, hepatitis panel negative for B and C.  His stool studies were negative for C. difficile and other GI pathogens in 2018 and 2019  Follow-up visit 06/01/2019 Patient underwent pouchoscopy which revealed severe inflammation in the neoterminal ileum as well as pouch.  He reports that the antibiotic ciprofloxacin and metronidazole have been working well.  He ran out of ciprofloxacin, currently on metronidazole only, his diarrhea has significantly improved.  He reports having 2 watery, nonbloody bowel movements daily.  He denies abdominal pain.  He gained weight by 10 pounds.  He was admitted to [facility] psychiatry on 05/07/2019.  He started on mirtazapine which also helps with his appetite.  Follow-up visit 08/16/2019 He continues to do well.  His weight has been stable, in fact gained about 2 pounds since last visit.  He does not have antibiotics anymore.  He continues to have 3 loose bowel movements daily, does not notice any blood.  He denies abdominal pain.  He is requesting protein supplements.  NSAIDs: None  Antiplts/Anticoagulants/Anti thrombotics: None  GI Procedures:   Pouchoscopy 05/06/2019 Diffuse inflammation, moderate in severity and characterized by congestion (edema), erosions, friability, loss of vascularity and shallow ulcerations was found in the distal pre-pouch ileum. Biopsies were taken with a cold forceps for histology. Inflammation characterized by congestion (edema), erosions, friability, loss of vascularity and deep ulcerations was found in the ileoanal pouch. This was graded as Pouchitis Disease Activity Index (Endoscopic) Score 5. Biopsies were taken with a cold forceps for histology.  Surgical Pathology  DIAGNOSIS:  A. NEOTERMINAL ILEUM; COLD BIOPSY:  - MILD ACTIVE ENTERITIS.  - TWO BIOPSY FRAGMENTS WITH MILD CHRONIC ACTIVE ENTERITIS.  - SEE COMMENT.  - NEGATIVE FOR VIRAL CYTOPATHIC EFFECT, DYSPLASIA, AND MALIGNANCY.   B. POUCH; COLD BIOPSY:  -  MODERATE ACTIVE INFLAMMATION WITH CHRONIC MUCOSAL CHANGES, CONSISTENT  WITH CLINICAL IMPRESSION OF POUCHITIS.  - NEGATIVE FOR DYSPLASIA AND MALIGNANCY.  - IHC FOR CMV WILL BE REPORTED IN AN ADDENDUM.   Comment:  Inflammation proximal to the pouch ("pre-pouch ileitis") is common in  patients with pouchitis. Since pouchitis cannot be distinguished from  IBD histologically correlation with clinical impression is required. An immunohistochemical stain for CMV was performed on part B and is  negative.   Colonoscopy 01/28/2000 at [facility] Diagnosis: A: Colon, right, biopsy. - Moderate active colitis, negative for dysplasia.  B: Colon, left, biopsy. - Moderate active colitis, negative for dysplasia.  Diagnosis: Colon, total colectomy. - Moderate to severe chronic active colitis consistent with ulcerative colitis involving essentially the entire colon (see comment). - Proximal (ileal) margin with mild acute serositis. - Distal (anal) margin with mild chronic active colitis (lymphocytic proctitis). - Appendix with acute appendicitis consistent with involvement by ulcerative colitis. - Four lymph nodes with lymphoid hyperplasia, no evidence of malignancy (0/4). - Acute serositis, mild. - No evidence of dysplasia or malignancy.  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 Anemia   . Anxiety   . Osteomyelitis ([place]) 01/2018   RIGHT FOOT   . Schizophrenia ([place])   . Ulcerative colitis [facility])     Past Surgical History:  Procedure Laterality Date  . AMPUTATION Right 01/18/2018   Procedure: AMPUTATION OF TOES;  Surgeon: Newt Minion, MD;  Location: [facility];  Service: Orthopedics;  Laterality: Right;  . POUCHOSCOPY N/A 05/06/2019   Procedure: POUCHOSCOPY;  Surgeon: Lin Landsman, MD;  Location: [facility] ENDOSCOPY;  Service: Gastroenterology;  Laterality: N/A;  . SMALL INTESTINE SURGERY  2016   in [place]  . TOTAL COLECTOMY  2003    Current Outpatient Medications:  .  atenolol (TENORMIN) 25 MG tablet, Take 12.5 mg by mouth daily. , Disp: , Rfl:  .  hydrOXYzine (ATARAX/VISTARIL) 50 MG tablet, Take 1 tablet (50 mg total) by mouth every 6 (six) hours as needed for anxiety (agitation)., Disp: 30 tablet, Rfl: 0 .  paliperidone (INVEGA) 6 MG 24 hr tablet, Take 1 tablet (6 mg total) by mouth daily., Disp: 30 tablet, Rfl: 0 .  ciprofloxacin (CIPRO) 500 MG tablet, Take 1 tablet (500 mg total) by mouth 2 (two) times daily., Disp: 60 tablet, Rfl: 2 .  metroNIDAZOLE (FLAGYL) 500 MG tablet, Take 1 tablet (500 mg total) by mouth 2 (two) times daily., Disp: 60 tablet, Rfl: 2 .  paliperidone (INVEGA SUSTENNA) 234 MG/1.5ML SUSY injection, Inject 234 mg into the muscle once for 1 dose., Disp: 1.5 mL, Rfl: 0    Family History  Problem Relation Age of Onset  . Colon cancer Father 8  . Stomach cancer Neg Hx  Social History   Tobacco Use  . Smoking status: Current Every Day Smoker    Packs/day: 0.25    Types: Cigarettes, Cigars  . Smokeless tobacco: Never Used  Substance Use Topics  . Alcohol use: No    Alcohol/week: 0.0 standard drinks  . Drug use: No    Allergies as of 08/16/2019  . (No Known Allergies)    Review of Systems:    All systems reviewed and negative except where noted in HPI.   Physical Exam:  BP 112/75 (BP Location: Left Arm, Patient Position: Sitting, Cuff Size: Normal)   Pulse 94   Temp 98.3 F (36.8  C)   Resp 18   Ht 5' 11"  (1.803 m)   Wt 134 lb 12.8 oz (61.1 kg)   BMI 18.80 kg/m  No LMP for male patient.  General:   Alert, thin built, cachectic and cooperative in NAD Head:  Normocephalic and atraumatic, bitemporal wasting. Eyes:  Sclera clear, no icterus.   Conjunctiva pink. Ears:  Normal auditory acuity. Nose:  No deformity, discharge, or lesions. Mouth:  No deformity or lesions,oropharynx pink & moist. Neck:  Supple; no masses or thyromegaly. Lungs:  Respirations even and unlabored.  Clear throughout to auscultation.   No wheezes, crackles, or rhonchi. No acute distress. Heart:  Regular rate and rhythm; no murmurs, clicks, rubs, or gallops. Abdomen:  Normal bowel sounds. Soft, non-tender and distended, tympanic, old scar from previous surgery without masses, hepatosplenomegaly or hernias noted.  No guarding or rebound tenderness.   Rectal: Not performed Msk:  Symmetrical without gross deformities. Good, equal movement & strength bilaterally. Pulses:  Normal pulses noted. Extremities:  No clubbing or edema.  No cyanosis. Neurologic:  Alert and oriented x3;  grossly normal neurologically. Skin:  Intact without significant lesions or rashes. No jaundice. Psych:  Alert and cooperative. Normal mood and affect.  Imaging Studies: Reviewed  Assessment and Plan:   Gustaf Mccarter is a 43 y.o. 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  He also has pre-pouch ileitis.  Patient is currently in clinical remission on antibiotics.  Patient gained about 12 to 15 pounds and remains stable  Recommend to continue Cipro and Flagyl 500 mg twice daily, long-term Continue mirtazapine Recommend  pouchoscopy to assess inflammation of the pouch  Follow up in 3 months  [name], MD

## 2019-09-06 ENCOUNTER — Other Ambulatory Visit: Admission: RE | Admit: 2019-09-06 | Payer: Medicaid Other | Source: Ambulatory Visit

## 2019-09-08 ENCOUNTER — Telehealth: Payer: Self-pay

## 2019-09-08 NOTE — Telephone Encounter (Signed)
Trish in Endoscopy contacted office in regards to patient not having COVID test for his Pouchoscopy.  I contacted patients now former healthcare POA to ask her to let pt know his procedure has been canceled and to have him call the office back to reschedule.  Thanks [employer]

## 2019-09-22 ENCOUNTER — Encounter: Admission: RE | Payer: Self-pay | Source: Ambulatory Visit

## 2019-09-22 ENCOUNTER — Ambulatory Visit: Admission: RE | Admit: 2019-09-22 | Payer: Medicaid Other | Source: Ambulatory Visit | Admitting: Gastroenterology

## 2019-09-22 SURGERY — ENDOSCOPY, POUCH, SMALL INTESTINE, DIAGNOSTIC
Anesthesia: General

## 2019-10-11 ENCOUNTER — Other Ambulatory Visit: Payer: Self-pay

## 2019-10-16 ENCOUNTER — Emergency Department (HOSPITAL_COMMUNITY)
Admission: EM | Admit: 2019-10-16 | Discharge: 2019-10-18 | Disposition: A | Discharge status: Home / Self Care | Payer: Medicaid Other | Attending: Emergency Medicine | Admitting: Emergency Medicine

## 2019-10-16 ENCOUNTER — Encounter (HOSPITAL_COMMUNITY): Payer: Self-pay | Admitting: Emergency Medicine

## 2019-10-16 ENCOUNTER — Other Ambulatory Visit: Payer: Self-pay

## 2019-10-16 DIAGNOSIS — Z79899 Other long term (current) drug therapy: Secondary | ICD-10-CM | POA: Diagnosis not present

## 2019-10-16 DIAGNOSIS — F22 Delusional disorders: Secondary | ICD-10-CM | POA: Diagnosis not present

## 2019-10-16 DIAGNOSIS — F259 Schizoaffective disorder, unspecified: Secondary | ICD-10-CM | POA: Insufficient documentation

## 2019-10-16 DIAGNOSIS — R441 Visual hallucinations: Secondary | ICD-10-CM | POA: Diagnosis present

## 2019-10-16 DIAGNOSIS — F419 Anxiety disorder, unspecified: Secondary | ICD-10-CM | POA: Insufficient documentation

## 2019-10-16 DIAGNOSIS — Z20822 Contact with and (suspected) exposure to covid-19: Secondary | ICD-10-CM | POA: Diagnosis not present

## 2019-10-16 DIAGNOSIS — F17210 Nicotine dependence, cigarettes, uncomplicated: Secondary | ICD-10-CM | POA: Insufficient documentation

## 2019-10-16 DIAGNOSIS — F203 Undifferentiated schizophrenia: Secondary | ICD-10-CM | POA: Diagnosis present

## 2019-10-16 DIAGNOSIS — F250 Schizoaffective disorder, bipolar type: Secondary | ICD-10-CM | POA: Diagnosis present

## 2019-10-16 LAB — COMPREHENSIVE METABOLIC PANEL
ALT: 18 U/L (ref 0–44)
AST: 17 U/L (ref 15–41)
Albumin: 3.6 g/dL (ref 3.5–5.0)
Alkaline Phosphatase: 57 U/L (ref 38–126)
Anion gap: 7 (ref 5–15)
BUN: 16 mg/dL (ref 6–20)
CO2: 28 mmol/L (ref 22–32)
Calcium: 9 mg/dL (ref 8.9–10.3)
Chloride: 105 mmol/L (ref 98–111)
Creatinine, Ser: 0.85 mg/dL (ref 0.61–1.24)
GFR calc Af Amer: >60 mL/min (ref >60)
GFR calc non Af Amer: >60 mL/min (ref >60)
Glucose, Bld: 87 mg/dL (ref 70–99)
Potassium: 3.9 mmol/L (ref 3.5–5.1)
Sodium: 140 mmol/L (ref 135–145)
Total Bilirubin: 0.5 mg/dL (ref 0.3–1.2)
Total Protein: 7.5 g/dL (ref 6.5–8.1)

## 2019-10-16 LAB — RAPID URINE DRUG SCREEN, HOSP PERFORMED
Amphetamines: NOT DETECTED
Barbiturates: NOT DETECTED
Benzodiazepines: NOT DETECTED
Cocaine: NOT DETECTED
Opiates: NOT DETECTED
Tetrahydrocannabinol: NOT DETECTED

## 2019-10-16 LAB — RESPIRATORY PANEL BY RT PCR (FLU A&B, COVID)
Influenza A by PCR: NEGATIVE
Influenza B by PCR: NEGATIVE
SARS Coronavirus 2 by RT PCR: NEGATIVE

## 2019-10-16 LAB — CBC
HCT: 42.6 % (ref 39.0–52.0)
Hemoglobin: 13.2 g/dL (ref 13.0–17.0)
MCH: 29.1 pg (ref 26.0–34.0)
MCHC: 31 g/dL (ref 30.0–36.0)
MCV: 94 fL (ref 80.0–100.0)
Platelets: 200 10*3/uL (ref 150–400)
RBC: 4.53 MIL/uL (ref 4.22–5.81)
RDW: 12.5 % (ref 11.5–15.5)
WBC: 7.8 10*3/uL (ref 4.0–10.5)
nRBC: 0 % (ref 0.0–0.2)

## 2019-10-16 LAB — ACETAMINOPHEN LEVEL: Acetaminophen (Tylenol), Serum: <10 ug/mL — ABNORMAL LOW (ref 10–30)

## 2019-10-16 LAB — SALICYLATE LEVEL: Salicylate Lvl: <7 mg/dL — ABNORMAL LOW (ref 7.0–30.0)

## 2019-10-16 LAB — ETHANOL: Alcohol, Ethyl (B): <10 mg/dL (ref <10)

## 2019-10-16 MED ORDER — LORAZEPAM 1 MG PO TABS
2.0000 mg | ORAL_TABLET | Freq: Once | ORAL | Status: AC | PRN
  Administered 2019-10-16: 2 mg via ORAL
  Filled 2019-10-16: qty 2

## 2019-10-16 MED ORDER — LORAZEPAM 2 MG/ML IJ SOLN: 2.0000 mg | Freq: Once | INTRAMUSCULAR | Status: DC | PRN

## 2019-10-16 MED ORDER — PALIPERIDONE ER 6 MG PO TB24
6.0000 mg | ORAL_TABLET | Freq: Every day | ORAL | Status: DC
  Administered 2019-10-17 – 2019-10-18 (×2): 6 mg via ORAL
  Filled 2019-10-16 (×2): qty 1

## 2019-10-16 MED ORDER — LORAZEPAM 2 MG/ML IJ SOLN: 2.0000 mg | Freq: Once | INTRAMUSCULAR | Status: AC | PRN

## 2019-10-16 MED ORDER — MIRTAZAPINE 7.5 MG PO TABS
15.0000 mg | ORAL_TABLET | Freq: Every day | ORAL | Status: DC
  Administered 2019-10-16 – 2019-10-17 (×2): 15 mg via ORAL
  Filled 2019-10-16 (×2): qty 2

## 2019-10-16 MED ORDER — LORAZEPAM 1 MG PO TABS: 2.0000 mg | ORAL_TABLET | Freq: Once | ORAL | Status: DC | PRN

## 2019-10-16 MED ORDER — ATENOLOL 25 MG PO TABS
12.5000 mg | ORAL_TABLET | Freq: Every day | ORAL | Status: DC
  Administered 2019-10-17 – 2019-10-18 (×2): 12.5 mg via ORAL
  Filled 2019-10-16 (×2): qty 0.5

## 2019-10-16 NOTE — ED Notes (Signed)
Called pharmacy to reconcile med rec.

## 2019-10-16 NOTE — ED Notes (Signed)
[place] [facility].  INCLUDES HIS WALLET WHICH HE SAID HAS NO MONEY AND INCLUDES HIS  CELL PHONE.
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
## 2019-10-16 NOTE — ED Notes (Addendum)
Attempted blood draw x2 right arm without success. Called Phlebotomy.
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
## 2019-10-16 NOTE — ED Notes (Signed)
Pt paranoid and wanting to leave. Does not understand what we are doing here and feels like we might be trying to hurt him. Messaged Dr. Sedonia Small who is initiating IVC.
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
## 2019-10-17 MED ORDER — ACETAMINOPHEN 325 MG PO TABS
650.0000 mg | ORAL_TABLET | Freq: Once | ORAL | Status: AC
  Administered 2019-10-17: 650 mg via ORAL
  Filled 2019-10-17: qty 2
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
## 2019-10-17 NOTE — Progress Notes (Signed)
Patient meets criteria for inpatient treatment. No appropriate or available beds at [facility]. CSW faxed referrals to the following facilities for review:  [place]    TTS will continue to seek bed placement.  Chalmers Guest. Guerry Bruin, MSW, [place] Work/Disposition Phone: [phone] Fax: [phone]

## 2019-10-18 ENCOUNTER — Other Ambulatory Visit: Payer: Medicaid Other

## 2019-10-18 ENCOUNTER — Encounter (HOSPITAL_COMMUNITY): Payer: Self-pay | Admitting: Registered Nurse

## 2019-10-18 DIAGNOSIS — F250 Schizoaffective disorder, bipolar type: Secondary | ICD-10-CM

## 2019-10-18 DIAGNOSIS — F419 Anxiety disorder, unspecified: Secondary | ICD-10-CM | POA: Insufficient documentation

## 2019-10-18 DIAGNOSIS — F22 Delusional disorders: Secondary | ICD-10-CM | POA: Insufficient documentation

## 2019-10-18 NOTE — Discharge Instructions (Signed)
[facility] ACT Team      [address]      [place], [place]  [address]      [phone]      or [phone]

## 2019-10-18 NOTE — Consult Note (Signed)
[facility] Psych ED Discharge  10/18/2019 10:41 AM Amedio Bowlby  MRN:  [id number] Principal Problem: Schizoaffective disorder, bipolar type [facility]) Discharge Diagnoses: Principal Problem:   Schizoaffective disorder, bipolar type ([place]) Active Problems:   Undifferentiated schizophrenia ([place])   Subjective: Christian Lamb, 44 y.o., male patient seen via tele psych by this provider, Dr. Dwyane Dee; and chart reviewed on 10/18/19.  On evaluation Emersyn Kotarski reports he came to the hospital yesterday because he had an anxiety episode "but I'm feeling much better today and I'm ready to go home.  I feel pretty good."  At this time patient denies suicidal/self-harm/homicidal ideations, psychosis, and paranoia.  Patient reports that he does have a place to live.  States that he has ACTT services with [facility].  Patient also states he has been compliant with his medications. During evaluation Raymel Cull is alert/oriented x 4; calm/cooperative; and mood is congruent with affect.  He does not appear to be responding to internal/external stimuli or delusional thoughts.  Patient denies suicidal/self-harm/homicidal ideation, psychosis, and paranoia.  Patient answered question appropriately.     Total Time spent with patient: 30 minutes  Past Psychiatric History: See above  Past Medical History:  Past Medical History:  Diagnosis Date  . AKI (acute kidney injury) ([place]) 12/05/2017  . Anemia   . Anemia of chronic disease 12/05/2017  . Anxiety   . Chronic tachycardia 01/13/2015  . Elevated liver enzymes 12/05/2017  . Loss of weight 03/21/2017  . Necrotic toes ([place] [address]) 01/16/2018  . Osteomyelitis ([place]) 01/2018   RIGHT FOOT  . Protein-calorie malnutrition, severe 12/05/2017  . Schizophrenia ([place])   . Sepsis ([place]) 12/04/2017  . 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 Ulcerative colitis [facility])     Past Surgical History:  Procedure Laterality Date  . AMPUTATION Right 01/18/2018   Procedure: AMPUTATION OF TOES;   Surgeon: Newt Minion, MD;  Location: [place];  Service: Orthopedics;  Laterality: Right;  . POUCHOSCOPY N/A 05/06/2019   Procedure: POUCHOSCOPY;  Surgeon: Lin Landsman, MD;  Location: [facility] ENDOSCOPY;  Service: Gastroenterology;  Laterality: N/A;  . SMALL INTESTINE SURGERY  2016   in [place]  . TOTAL COLECTOMY  2003   Family History:  Family History  Problem Relation Age of Onset  . Colon cancer Father 76  . Stomach cancer Neg Hx    Family Psychiatric  History: Unaware Social History:  Social History   Substance and Sexual Activity  Alcohol Use No  . Alcohol/week: 0.0 standard drinks     Social History   Substance and Sexual Activity  Drug Use No    Social History   Socioeconomic History  . Marital status: Single    Spouse name: Not on file  . Number of children: Not on file  . Years of education: Not on file  . Highest education level: Not on file  Occupational History  . Not on file  Tobacco Use  . Smoking status: Current Every Day Smoker    Packs/day: 0.25    Types: Cigarettes, Cigars  . Smokeless tobacco: Never Used  Substance and Sexual Activity  . Alcohol use: No    Alcohol/week: 0.0 standard drinks  . Drug use: No  . Sexual activity: Not Currently  Other Topics Concern  . Not on file  Social History Narrative  . Not on file   Social Determinants of Health   Financial Resource Strain:   . Difficulty of Paying Living Expenses: Not on file  Food Insecurity:   . Worried About  Running Out of Food in the Last Year: Not on file  . Ran Out of Food in the Last Year: Not on file  Transportation Needs:   . Lack of Transportation (Medical): Not on file  . Lack of Transportation (Non-Medical): Not on file  Physical Activity:   . Days of Exercise per Week: Not on file  . Minutes of Exercise per Session: Not on file  Stress:   . Feeling of Stress : Not on file  Social Connections:   . Frequency of Communication with Friends and Family: Not on file  .  Frequency of Social Gatherings with Friends and Family: Not on file  . Attends Religious Services: Not on file  . Active Member of Clubs or Organizations: Not on file  . Attends Archivist Meetings: Not on file  . Marital Status: Not on file    Has this patient used any form of tobacco in the last 30 days? (Cigarettes, Smokeless Tobacco, Cigars, and/or Pipes) A prescription for an FDA-approved tobacco cessation medication was offered at discharge and the patient refused  Current Medications: Current Facility-Administered Medications  Medication Dose Route Frequency Provider Last Rate Last Admin  . atenolol (TENORMIN) tablet 12.5 mg  12.5 mg Oral Daily Domenic Moras, PA-C   12.5 mg at 10/18/19 1024  . mirtazapine (REMERON) tablet 15 mg  15 mg Oral QHS Domenic Moras, PA-C   15 mg at 10/17/19 2128  . paliperidone (INVEGA) 24 hr tablet 6 mg  6 mg Oral Daily Domenic Moras, PA-C   6 mg at 10/18/19 1024   Current Outpatient Medications  Medication Sig Dispense Refill  . atenolol (TENORMIN) 25 MG tablet Take 12.5 mg by mouth daily.     . GNP MELATONIN 3 MG TABS Take 1 tablet by mouth at bedtime.    . hydrOXYzine (ATARAX/VISTARIL) 50 MG tablet Take 1 tablet (50 mg total) by mouth every 6 (six) hours as needed for anxiety (agitation). 30 tablet 0  . mirtazapine (REMERON) 15 MG tablet Take 15 mg by mouth at bedtime.    . paliperidone (INVEGA SUSTENNA) 234 MG/1.5ML SUSY injection Inject 234 mg into the muscle once for 1 dose. 1.5 mL 0  . paliperidone (INVEGA) 6 MG 24 hr tablet Take 1 tablet (6 mg total) by mouth daily. 30 tablet 0   PTA Medications: (Not in a hospital admission)   Musculoskeletal: Strength & Muscle Tone: within normal limits Gait & Station: normal Patient leans: N/A  Psychiatric Specialty Exam: Physical Exam Vitals and nursing note reviewed.  Constitutional:      Appearance: Normal appearance.  Pulmonary:     Effort: Pulmonary effort is normal.  Neurological:      Mental Status: He is alert.  Psychiatric:        Mood and Affect: Mood normal.        Behavior: Behavior normal.        Thought Content: Thought content normal.        Judgment: Judgment normal.     Review of Systems  Psychiatric/Behavioral: Negative for agitation, behavioral problems, confusion, hallucinations, self-injury, sleep disturbance and suicidal ideas. Nervous/anxious: Stable.   All other systems reviewed and are negative.   Blood pressure 121/88, pulse 94, temperature 98.2 F (36.8 C), temperature source Oral, resp. rate 16, SpO2 99 %.There is no height or weight on file to calculate BMI.  General Appearance: Casual  Eye Contact:  Good  Speech:  Clear and Coherent and Normal Rate  Volume:  Normal  Mood:  "Pretty good" Appropriate  Affect:  Appropriate and Congruent  Thought Process:  Coherent, Goal Directed and Descriptions of Associations: Intact  Orientation:  Full (Time, Place, and Person)  Thought Content:  WDL  Suicidal Thoughts:  No  Homicidal Thoughts:  No  Memory:  Immediate;   Good  Judgement:  Good  Insight:  Present  Psychomotor Activity:  Normal  Concentration:  Concentration: Good and Attention Span: Good  Recall:  Good  Fund of Knowledge:  Fair  Language:  Good  Akathisia:  No  Handed:  Right  AIMS (if indicated):     Assets:  Communication Skills Desire for Improvement Housing  ADL's:  Intact  Cognition:  WNL  Sleep:        Demographic Factors:  Male  Loss Factors: NA  Historical Factors: Impulsivity  Risk Reduction Factors:   Religious beliefs about death and Positive therapeutic relationship  Continued Clinical Symptoms:  Alcohol/Substance Abuse/Dependencies Previous Psychiatric Diagnoses and Treatments  Cognitive Features That Contribute To Risk:  None    Suicide Risk:  Minimal: No identifiable suicidal ideation.  Patients presenting with no risk factors but with morbid ruminations; may be classified as minimal risk based  on the severity of the depressive symptoms    Plan Of Care/Follow-up recommendations:  Activity:  As tolerated Diet:  Heart healthy    Discharge Instructions          Psychotherapeutic Services ACT Team      [address]      [place], [place]  [address]      [phone]      or [phone]     Disposition: Patient psychiatrically cleared No evidence of imminent risk to self or others at present.   Patient does not meet criteria for psychiatric inpatient admission. Supportive therapy provided about ongoing stressors. Discussed crisis plan, support from social network, calling 911, coming to the Emergency Department, and calling Suicide Hotline.  Caleel Kiner, NP 10/18/2019, 10:41 AM

## 2019-10-18 NOTE — BH Assessment (Signed)
[place] Assessment Progress Note  Per Shuvon Rankin, FNP, this pt does not require psychiatric hospitalization at this time.  Pt presents under IVC initiated by EDP Gerlene Fee, MD, which has been rescinded by Hampton Abbot, MD.  Pt is to be discharged from [facility] with recommendation to continue treatment with the [facility] of [place].  This has been included in pt's discharge instructions.  At 10:55 I called [facility] and spoke to [place].  She reports that pt has been staying at the [facility] in [place], and that they will not be able to provide transportation for him today.  Pt's nurse, Diane, has been notified.  Jalene Mullet, [place] Triage Specialist [phone]

## 2019-10-18 NOTE — ED Notes (Signed)
Pt discharged home. Discharged instructions read to pt who verbalized understanding. All belongings returned to pt who signed for same. Denies SI/HI, is not delusional and not responding to internal stimuli. Escorted pt to the ED exit.    

## 2019-10-20 ENCOUNTER — Encounter (HOSPITAL_COMMUNITY): Payer: Self-pay

## 2019-10-20 ENCOUNTER — Other Ambulatory Visit: Payer: Self-pay

## 2019-10-20 ENCOUNTER — Encounter: Payer: Self-pay | Admitting: Critical Care Medicine

## 2019-10-20 ENCOUNTER — Emergency Department (HOSPITAL_COMMUNITY)
Admission: EM | Admit: 2019-10-20 | Discharge: 2019-10-21 | Disposition: A | Discharge status: Home / Self Care | Payer: Medicaid Other | Attending: Emergency Medicine | Admitting: Emergency Medicine

## 2019-10-20 ENCOUNTER — Emergency Department (HOSPITAL_COMMUNITY): Payer: Medicaid Other

## 2019-10-20 ENCOUNTER — Other Ambulatory Visit: Payer: Self-pay | Admitting: Critical Care Medicine

## 2019-10-20 DIAGNOSIS — Z79899 Other long term (current) drug therapy: Secondary | ICD-10-CM | POA: Diagnosis not present

## 2019-10-20 DIAGNOSIS — F209 Schizophrenia, unspecified: Secondary | ICD-10-CM | POA: Diagnosis not present

## 2019-10-20 DIAGNOSIS — F17210 Nicotine dependence, cigarettes, uncomplicated: Secondary | ICD-10-CM | POA: Diagnosis not present

## 2019-10-20 DIAGNOSIS — R0602 Shortness of breath: Secondary | ICD-10-CM | POA: Diagnosis present

## 2019-10-20 DIAGNOSIS — F22 Delusional disorders: Secondary | ICD-10-CM

## 2019-10-20 DIAGNOSIS — Z20822 Contact with and (suspected) exposure to covid-19: Secondary | ICD-10-CM | POA: Diagnosis not present

## 2019-10-20 LAB — COMPREHENSIVE METABOLIC PANEL
ALT: 16 U/L (ref 0–44)
AST: 14 U/L — ABNORMAL LOW (ref 15–41)
Albumin: 3.5 g/dL (ref 3.5–5.0)
Alkaline Phosphatase: 59 U/L (ref 38–126)
Anion gap: 6 (ref 5–15)
BUN: 19 mg/dL (ref 6–20)
CO2: 27 mmol/L (ref 22–32)
Calcium: 9 mg/dL (ref 8.9–10.3)
Chloride: 106 mmol/L (ref 98–111)
Creatinine, Ser: 1.03 mg/dL (ref 0.61–1.24)
GFR calc Af Amer: >60 mL/min (ref >60)
GFR calc non Af Amer: >60 mL/min (ref >60)
Glucose, Bld: 91 mg/dL (ref 70–99)
Potassium: 4.3 mmol/L (ref 3.5–5.1)
Sodium: 139 mmol/L (ref 135–145)
Total Bilirubin: 0.6 mg/dL (ref 0.3–1.2)
Total Protein: 7.6 g/dL (ref 6.5–8.1)

## 2019-10-20 LAB — URINALYSIS, ROUTINE W REFLEX MICROSCOPIC
Bilirubin Urine: NEGATIVE
Glucose, UA: NEGATIVE mg/dL
Hgb urine dipstick: NEGATIVE
Ketones, ur: NEGATIVE mg/dL
Leukocytes,Ua: NEGATIVE
Nitrite: NEGATIVE
Protein, ur: NEGATIVE mg/dL
Specific Gravity, Urine: 1.014 (ref 1.005–1.030)
pH: 5 (ref 5.0–8.0)

## 2019-10-20 LAB — CBC WITH DIFFERENTIAL/PLATELET
Abs Immature Granulocytes: 0.02 10*3/uL (ref 0.00–0.07)
Basophils Absolute: 0 10*3/uL (ref 0.0–0.1)
Basophils Relative: 1 %
Eosinophils Absolute: 0.1 10*3/uL (ref 0.0–0.5)
Eosinophils Relative: 2 %
HCT: 41.4 % (ref 39.0–52.0)
Hemoglobin: 12.6 g/dL — ABNORMAL LOW (ref 13.0–17.0)
Immature Granulocytes: 0 %
Lymphocytes Relative: 19 %
Lymphs Abs: 1.4 10*3/uL (ref 0.7–4.0)
MCH: 28.4 pg (ref 26.0–34.0)
MCHC: 30.4 g/dL (ref 30.0–36.0)
MCV: 93.2 fL (ref 80.0–100.0)
Monocytes Absolute: 0.6 10*3/uL (ref 0.1–1.0)
Monocytes Relative: 8 %
Neutro Abs: 5.5 10*3/uL (ref 1.7–7.7)
Neutrophils Relative %: 70 %
Platelets: 208 10*3/uL (ref 150–400)
RBC: 4.44 MIL/uL (ref 4.22–5.81)
RDW: 12.5 % (ref 11.5–15.5)
WBC: 7.8 10*3/uL (ref 4.0–10.5)
nRBC: 0 % (ref 0.0–0.2)

## 2019-10-20 LAB — BRAIN NATRIURETIC PEPTIDE: B Natriuretic Peptide: 10.8 pg/mL (ref 0.0–100.0)

## 2019-10-20 MED ORDER — PALIPERIDONE ER 6 MG PO TB24: 6.0000 mg | ORAL_TABLET | Freq: Every day | ORAL | Status: DC

## 2019-10-20 MED ORDER — MIRTAZAPINE 7.5 MG PO TABS
15.0000 mg | ORAL_TABLET | Freq: Every day | ORAL | Status: DC
  Administered 2019-10-20: 15 mg via ORAL
  Filled 2019-10-20: qty 2

## 2019-10-20 MED ORDER — HYDROXYZINE HCL 25 MG PO TABS: 50.0000 mg | ORAL_TABLET | Freq: Four times a day (QID) | ORAL | Status: DC | PRN

## 2019-10-20 MED ORDER — MELATONIN 3 MG PO TABS
1.0000 | ORAL_TABLET | Freq: Every day | ORAL | Status: DC
  Administered 2019-10-20: 3 mg via ORAL
  Filled 2019-10-20: qty 1

## 2019-10-20 MED ORDER — ATENOLOL 25 MG PO TABS: 12.5000 mg | ORAL_TABLET | Freq: Every day | ORAL | Status: DC

## 2019-10-20 NOTE — ED Provider Notes (Signed)
[facility] DEPT Provider Note   CSN: [id number] Arrival date & time: 10/20/19  1935     History Chief Complaint  Patient presents with  . Shortness of Breath    Christian Lamb is a 44 y.o. male.  HPI    Presents with multiple complaints.  Reportedly complained EMS of chest pain.  However on my exam the patient denies physical pain.  When asked why he is here he states that he would like to start by talking on his childhood. He subsequently has a lengthy history of events, perceived slights, intermixed with tangential thoughts. Is not overtly interacting with nonpresent individuals, hallucinating.  However, the patient is inconsistently interactive, paranoid at times, delusional throughout. It is unclear if he has any true suicidal ideation. Similarly unclear if he is taking any medication regularly. Level 5 caveat secondary to psychiatric condition.  Past Medical History:  Diagnosis Date  . AKI (acute kidney injury) ([place]) 12/05/2017  . Anemia   . Anemia of chronic disease 12/05/2017  . Anxiety   . Chronic tachycardia 01/13/2015  . Elevated liver enzymes 12/05/2017  . Loss of weight 03/21/2017  . Necrotic toes ([place]) 01/16/2018  . Osteomyelitis ([place]) 01/2018   RIGHT FOOT  . Protein-calorie malnutrition, severe 12/05/2017  . Schizophrenia ([place])   . Sepsis ([place]) 12/04/2017  . 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 Ulcerative colitis [facility])     Patient Active Problem List   Diagnosis Date Noted  . Anxiety   . Paranoid ([place])   . History of ulcerative colitis 08/16/2019  . 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 Adjustment disorder 05/07/2019  . 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 Tobacco use disorder 02/12/2016  . Schizoaffective disorder, bipolar type ([place]) 02/11/2016  . Undifferentiated schizophrenia ([place]) 01/13/2015    Past Surgical History:  Procedure Laterality Date  . AMPUTATION Right 01/18/2018   Procedure: AMPUTATION OF TOES;  Surgeon: Newt Minion, MD;  Location: [place];  Service: Orthopedics;  Laterality: Right;  . POUCHOSCOPY N/A 05/06/2019   Procedure: POUCHOSCOPY;  Surgeon: Lin Landsman, MD;  Location: [facility] ENDOSCOPY;  Service: Gastroenterology;  Laterality: N/A;  . SMALL INTESTINE SURGERY  2016   in [place]  . TOTAL COLECTOMY  2003       Family History  Problem Relation Age of Onset  . Colon cancer Father 70  . Stomach cancer Neg Hx     Social History   Tobacco Use  . Smoking status: Current Every Day Smoker    Packs/day: 0.25    Types: Cigarettes, Cigars  . Smokeless tobacco: Never Used  Substance Use Topics  . Alcohol use: No    Alcohol/week: 0.0 standard drinks  . Drug use: No    Home Medications Prior to Admission medications   Medication Sig Start Date End Date Taking? Authorizing Provider  atenolol (TENORMIN) 25 MG tablet Take 12.5 mg by mouth daily.  07/09/19   [provider]  GNP MELATONIN 3 MG TABS Take 1 tablet by mouth at bedtime. 10/06/19   [provider]  hydrOXYzine (ATARAX/VISTARIL) 50 MG tablet Take 1 tablet (50 mg total) by mouth every 6 (six) hours as needed for anxiety (agitation). 07/07/19   Money, Lowry Ram, FNP  mirtazapine (REMERON) 15 MG tablet Take 15 mg by mouth at bedtime. 10/06/19   [provider]  paliperidone (INVEGA SUSTENNA) 234 MG/1.5ML SUSY injection Inject 234 mg into the muscle once for 1 dose. 07/30/19 10/16/19  Money, Lowry Ram, FNP  paliperidone (INVEGA) 6 MG 24 hr tablet Take 1 tablet (6 mg total) by mouth daily. 07/07/19   Money, Lowry Ram, FNP    Allergies    Patient has no known allergies.  Review of Systems   Review of Systems  Unable to perform ROS: Psychiatric disorder    Physical Exam Updated Vital Signs BP 108/78   Pulse 86   Temp 98 F (36.7 C) (Oral)   Resp 18   SpO2 99%   Physical Exam Vitals and nursing note reviewed.  Constitutional:      General: He is not in acute distress.    Appearance: He is  well-developed.  HENT:     Head: Normocephalic and atraumatic.  Eyes:     Conjunctiva/sclera: Conjunctivae normal.  Cardiovascular:     Rate and Rhythm: Normal rate and regular rhythm.  Pulmonary:     Effort: Pulmonary effort is normal. No respiratory distress.     Breath sounds: No stridor.  Abdominal:     General: There is no distension.  Skin:    General: Skin is warm and dry.  Neurological:     Mental Status: He is alert and oriented to person, place, and time.  Psychiatric:        Mood and Affect: Mood is anxious. Affect is labile.        Thought Content: Thought content is paranoid and delusional.     ED Results / Procedures / Treatments   Labs (all labs ordered are listed, but only abnormal results are displayed) Labs Reviewed  COMPREHENSIVE METABOLIC PANEL  CBC WITH DIFFERENTIAL/PLATELET  BRAIN NATRIURETIC PEPTIDE  URINALYSIS, ROUTINE W REFLEX MICROSCOPIC    EKG EKG Interpretation  Date/Time:  Wednesday October 20 2019 20:07:10 EST Ventricular Rate:  90 PR Interval:    QRS Duration: 80 QT Interval:  359 QTC Calculation: 440 R Axis:   66 Text Interpretation: Sinus rhythm ST elev, probable normal early repol pattern No significant change since last tracing unremarkable ecg Confirmed by Carmin Muskrat [phone]) on 10/20/2019 8:25:08 PM   Radiology DG Chest Port 1 View  Result Date: 10/20/2019 CLINICAL DATA:  Shortness of breath, COVID-19 positive EXAM: PORTABLE CHEST 1 VIEW COMPARISON:  Radiograph 12/04/2017 FINDINGS: Streaky opacities are present in the lung bases with an appearance favoring atelectatic change. No consolidative opacity is seen. No pneumothorax or effusion. Portion of the right lung apex is collimated. Cardiomediastinal contours are unchanged from prior. No acute osseous or soft tissue abnormality. Telemetry leads overlie the chest. IMPRESSION: Streaky opacities in the lung bases with an appearance favoring atelectatic change. No other acute  cardiopulmonary abnormality. Electronically Signed   By: Lovena Le M.D.   On: 10/20/2019 20:40    Procedures Procedures (including critical care time)  Medications Ordered in ED Medications  atenolol (TENORMIN) tablet 12.5 mg (has no administration in time range)  Melatonin TABS 3 mg (has no administration in time range)  hydrOXYzine (ATARAX/VISTARIL) tablet 50 mg (has no administration in time range)  mirtazapine (REMERON) tablet 15 mg (has no administration in time range)  paliperidone (INVEGA) 24 hr tablet 6 mg (has no administration in time range)    ED Course  I have reviewed the triage vital signs and the nursing notes.  Pertinent labs & imaging results that were available during my care of the patient were reviewed by me and considered in my medical decision making (see chart for details).    MDM Rules/Calculators/A&P  This patient presents with the need for medical evaluation due to ongoing psychiatric condition.  The patient's medical portion of the evaluation is generally reassuring, with no evidence of acute new pathology.  The patient has been medically cleared for further psychiatric evaluation.   Impression: Delusions   Carmin Muskrat, MD 10/20/19 2110

## 2019-10-20 NOTE — ED Notes (Signed)
TTS cart at bedside.

## 2019-10-20 NOTE — ED Triage Notes (Signed)
Per EMS[facility], anterior chest wall pain, cough, N/V, fatigue, headache. Patient tested negative for COVID-19 on 10/16/19. Currently is taking Cipro while he was waiting for a TB test. Patient ambulatory and A&Ox4.

## 2019-10-21 LAB — SARS CORONAVIRUS 2 (TAT 6-24 HRS): SARS Coronavirus 2: NEGATIVE

## 2019-10-21 NOTE — Congregational Nurse Program (Signed)
Saw client at [facility] 1/13 for emotional support. Anxious with pressured speech, able to provide comfort and reassurance with Dr Joya Gaskins present. Mr.Tenny, redirected to Act Team member that delivers his medication to [facility]. Mr. Martis left office in control and able to wait for his medication arrival.
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
## 2019-10-21 NOTE — ED Notes (Signed)
Per Talbot Grumbling, NP, pt can be psych cleared at this time. Pt should be reinforced to utilize his [facility] Psych Team for questions/concerns that are mental health related to assist in keeping pt from believing that the ED is the only way his needs can be met.

## 2019-10-21 NOTE — BH Assessment (Addendum)
Tele Assessment Note   Patient Name: Christian Lamb MRN: [id number] Referring Physician: Dr. Carmin Muskrat, MD Location of Patient: [facility] Location of Provider: [place] [facility]  Christian Lamb is a 44 y.o. male who was brought to [facility] via EMS due to pt's concerns that he was experiencing, per pt's nurse's note, "SHOB, anterior chest wall pain, cough, N/V, fatigue, headache." According to pt's EDP, during the assessment pt denied physical pain and instead stated that he was there because he "would like to start by talking on his childhood."  Of note, pt was assessed on 10/16/2019, 10/17/2019, and was discharged on 10/18/2019 with a note for pt to access his ACT Team for assistance, to which he has access 24/7.  Today, pt denies SI, a plan to kill himself, HI, NSSIB, access to guns/weapons, or SA. Pt is able to identify that is is "unsure" if he is experiencing AVH, stating "I dont' know what's going on in my mind." Pt shares he has a hx of starving himself in the past when he was depressed, though he states he has been eating better and exercising more since moving into the [employer]. Pt states he has a court date on October 29, 2019 for assault, communicating a threat, and trespassing.   Pt expressed an understanding of his ability to utilize his [facility] ACT Team at any time and shared that he likes the counselors on his Team. Pt states he has a cell phone and the ability to contact them when need  Pt declined having anyone available for clinician to contact for collateral information.  Pt is oriented x4. His recent and remote memory is intact. Pt was police and cooperative throughout the assessment process. Pt's insight, judgement, and impulse control is fair at this time.   Diagnosis: F20.9, Schizophrenia   Past Medical History:  Past Medical History:  Diagnosis Date  . AKI (acute kidney injury) ([place]) 12/05/2017  . Anemia   . Anemia of  chronic disease 12/05/2017  . Anxiety   . Chronic tachycardia 01/13/2015  . Elevated liver enzymes 12/05/2017  . Loss of weight 03/21/2017  . Necrotic toes ([place]) 01/16/2018  . Osteomyelitis ([place]) 01/2018   RIGHT FOOT  . Protein-calorie malnutrition, severe 12/05/2017  . Schizophrenia ([place])   . Sepsis ([place]) 12/04/2017  . 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 Ulcerative colitis [facility])     Past Surgical History:  Procedure Laterality Date  . AMPUTATION Right 01/18/2018   Procedure: AMPUTATION OF TOES;  Surgeon: Newt Minion, MD;  Location: [place];  Service: Orthopedics;  Laterality: Right;  . POUCHOSCOPY N/A 05/06/2019   Procedure: POUCHOSCOPY;  Surgeon: Lin Landsman, MD;  Location: [facility] ENDOSCOPY;  Service: Gastroenterology;  Laterality: N/A;  . SMALL INTESTINE SURGERY  2016   in [place]  . TOTAL COLECTOMY  2003    Family History:  Family History  Problem Relation Age of Onset  . Colon cancer Father 25  . Stomach cancer Neg Hx     Social History:  reports that he has been smoking cigarettes and cigars. He has been smoking about 0.25 packs per day. He has never used smokeless tobacco. He reports that he does not drink alcohol or use drugs.  Additional Social History:  Alcohol / Drug Use Pain Medications: Please see MAR Prescriptions: Please see MAR Over the Counter: Please see MAR History of alcohol / drug use?: No history of alcohol / drug abuse Longest period of sobriety (when/how long):  Pt denies SA  CIWA: CIWA-Ar BP: 114/85 Pulse Rate: 89 COWS:    Allergies: No Known Allergies  Home Medications: (Not in a hospital admission)   OB/GYN Status:  No LMP for male patient.  General Assessment Data Location of Assessment: [facility] ED TTS Assessment: In system Is this a Tele or Face-to-Face Assessment?: Tele Assessment Is this an Initial Assessment or a Re-assessment for this encounter?: Initial Assessment Patient Accompanied by:: N/A Language Other than English: No Living  Arrangements: Homeless/Shelter What gender do you identify as?: Male Marital status: Single Living Arrangements: Other (Comment)(Pt lives at [facility]) Can pt return to current living arrangement?: Yes Admission Status: Voluntary Is patient capable of signing voluntary admission?: Yes Referral Source: (EMS) Insurance type: Medicaid [place]     Crisis Care Plan Living Arrangements: Other (Comment)(Pt lives at [facility]) Legal Guardian: Other:(Self) Name of Psychiatrist: ACT Team - [facility]; Pt cannot remember the provider's name Name of Therapist: ACT Team - [facility]; Nelida Meuse, and [place]  Education Status Is patient currently in school?: No Is the patient employed, unemployed or receiving disability?: Receiving disability income  Risk to self with the past 6 months Suicidal Ideation: No Has patient been a risk to self within the past 6 months prior to admission? : No Suicidal Intent: No Has patient had any suicidal intent within the past 6 months prior to admission? : No Is patient at risk for suicide?: No Suicidal Plan?: No Has patient had any suicidal plan within the past 6 months prior to admission? : No Access to Means: No What has been your use of drugs/alcohol within the last 12 months?: Pt denies SA Previous Attempts/Gestures: Yes How many times?: 1 Other Self Harm Risks: Pt is homeless and currently lives in a homeless shelter Triggers for Past Attempts: Unknown Intentional Self Injurious Behavior: Damaging Comment - Self Injurious Behavior: Pt shares he has starved himself in the past when depressed Family Suicide History: Unable to assess Recent stressful life event(s): Financial Problems Persecutory voices/beliefs?: No Depression: Yes Depression Symptoms: Despondent, Isolating, Feeling worthless/self pity Substance abuse history and/or treatment for substance abuse?: No  Risk to Others within the past 6 months Homicidal  Ideation: No Does patient have any lifetime risk of violence toward others beyond the six months prior to admission? : No Thoughts of Harm to Others: No Current Homicidal Intent: No Current Homicidal Plan: No Access to Homicidal Means: No Identified Victim: None noted History of harm to others?: No Assessment of Violence: None Noted Violent Behavior Description: None noted Does patient have access to weapons?: No(Pt denies access to guns/weapons) Criminal Charges Pending?: Yes Describe Pending Criminal Charges: Assault/communicating a threat/tresspassing Does patient have a court date: Yes Court Date: 10/29/19 Is patient on probation?: No  Psychosis Hallucinations: (Unsure: "I don't know what's going on in my mind.") Delusions: None noted  Mental Status Report Appearance/Hygiene: In scrubs Eye Contact: Good Motor Activity: Freedom of movement(Pt is lying down in his hospital bed) Speech: Logical/coherent Level of Consciousness: Alert Mood: Anxious, Depressed Affect: Appropriate to circumstance Anxiety Level: Minimal Thought Processes: Flight of Ideas Judgement: Partial Orientation: Person, Place, Time, Situation Obsessive Compulsive Thoughts/Behaviors: None  Cognitive Functioning Concentration: Fair Memory: Recent Intact, Remote Intact Is patient IDD: No Insight: Fair Impulse Control: Fair Appetite: Good Have you had any weight changes? : No Change Sleep: Increased Total Hours of Sleep: 8 Vegetative Symptoms: None  ADLScreening [facility] Assessment Services) Patient's cognitive ability adequate to safely complete daily activities?:  Yes Patient able to express need for assistance with ADLs?: Yes Independently performs ADLs?: Yes (appropriate for developmental age)  Prior Inpatient Therapy Prior Inpatient Therapy: Yes Prior Therapy Dates: (06/2019) Prior Therapy Facilty/Provider(s): [facility], [facility], [facility] Reason for Treatment: Schizophrenia  Prior Outpatient  Therapy Prior Outpatient Therapy: No Does patient have an ACCT team?: Yes Does patient have Intensive In-House Services?  : No Does patient have [facility] services? : No Does patient have P4CC services?: No  ADL Screening (condition at time of admission) Patient's cognitive ability adequate to safely complete daily activities?: Yes Is the patient deaf or have difficulty hearing?: No Does the patient have difficulty seeing, even when wearing glasses/contacts?: No Does the patient have difficulty concentrating, remembering, or making decisions?: Yes Patient able to express need for assistance with ADLs?: Yes Does the patient have difficulty dressing or bathing?: No Independently performs ADLs?: Yes (appropriate for developmental age) Does the patient have difficulty walking or climbing stairs?: No Weakness of Legs: Both Weakness of Arms/Hands: None  Home Assistive Devices/Equipment Home Assistive Devices/Equipment: None  Therapy Consults (therapy consults require a physician order) PT Evaluation Needed: No OT Evalulation Needed: No SLP Evaluation Needed: No Abuse/Neglect Assessment (Assessment to be complete while patient is alone) Abuse/Neglect Assessment Can Be Completed: Unable to assess, patient is non-responsive or altered mental status Values / Beliefs Cultural Requests During Hospitalization: None Spiritual Requests During Hospitalization: None Consults Spiritual Care Consult Needed: No Transition of Care Team Consult Needed: No Advance Directives (For Healthcare) Does Patient Have a Medical Advance Directive?: Unable to assess, patient is non-responsive or altered mental status          Disposition: Adaku Anike, NP, reviewed pt's chart and information and determined pt can be psych cleared at this time. Pt should be reinforced to utilize his [facility] Psych Team for questions/concerns that are mental health related to assist in keeping pt from believing that the ED is the only  way his needs can be met. This information was provided to pt's nurse, Emeterio Reeve, at [phone].   Disposition Initial Assessment Completed for this Encounter: Yes Patient referred to: Other (Comment)(Pt is encouraged to make contact with his ACT Team)  This service was provided via telemedicine using a 2-way, interactive audio and video technology.  Names of all persons participating in this telemedicine service and their role in this encounter. Name: Gavriel Holzhauer" SAYTKZS Role: Patient  Name: Talbot Grumbling Role: Nurse Practitioner  Name: Windell Hummingbird Role: Clinician    Dannielle Burn 10/21/2019 12:20 AM

## 2019-10-26 ENCOUNTER — Ambulatory Visit (LOCAL_COMMUNITY_HEALTH_CENTER): Payer: Self-pay

## 2019-10-26 ENCOUNTER — Other Ambulatory Visit: Payer: Self-pay

## 2019-10-26 DIAGNOSIS — Z111 Encounter for screening for respiratory tuberculosis: Secondary | ICD-10-CM

## 2019-10-29 ENCOUNTER — Other Ambulatory Visit: Payer: Self-pay

## 2019-10-29 ENCOUNTER — Ambulatory Visit: Payer: Self-pay

## 2019-10-29 DIAGNOSIS — Z111 Encounter for screening for respiratory tuberculosis: Secondary | ICD-10-CM

## 2019-10-29 LAB — TB SKIN TEST
Induration: 0 mm
TB Skin Test: NEGATIVE

## 2019-11-01 ENCOUNTER — Encounter (HOSPITAL_COMMUNITY): Payer: Self-pay | Admitting: Emergency Medicine

## 2019-11-01 ENCOUNTER — Emergency Department (HOSPITAL_COMMUNITY)
Admission: EM | Admit: 2019-11-01 | Discharge: 2019-11-03 | Disposition: A | Discharge status: Home / Self Care | Payer: Medicaid Other | Attending: Emergency Medicine | Admitting: Emergency Medicine

## 2019-11-01 ENCOUNTER — Other Ambulatory Visit: Payer: Self-pay

## 2019-11-01 DIAGNOSIS — Z20822 Contact with and (suspected) exposure to covid-19: Secondary | ICD-10-CM | POA: Diagnosis not present

## 2019-11-01 DIAGNOSIS — F209 Schizophrenia, unspecified: Secondary | ICD-10-CM

## 2019-11-01 DIAGNOSIS — F22 Delusional disorders: Secondary | ICD-10-CM | POA: Diagnosis present

## 2019-11-01 DIAGNOSIS — Z046 Encounter for general psychiatric examination, requested by authority: Secondary | ICD-10-CM | POA: Diagnosis present

## 2019-11-01 DIAGNOSIS — F203 Undifferentiated schizophrenia: Secondary | ICD-10-CM | POA: Diagnosis not present

## 2019-11-01 DIAGNOSIS — F419 Anxiety disorder, unspecified: Secondary | ICD-10-CM | POA: Diagnosis not present

## 2019-11-01 DIAGNOSIS — F17210 Nicotine dependence, cigarettes, uncomplicated: Secondary | ICD-10-CM | POA: Insufficient documentation

## 2019-11-01 DIAGNOSIS — Z79899 Other long term (current) drug therapy: Secondary | ICD-10-CM | POA: Diagnosis not present

## 2019-11-01 LAB — CBC
HCT: 45 % (ref 39.0–52.0)
Hemoglobin: 13.9 g/dL (ref 13.0–17.0)
MCH: 28.7 pg (ref 26.0–34.0)
MCHC: 30.9 g/dL (ref 30.0–36.0)
MCV: 93 fL (ref 80.0–100.0)
Platelets: 248 10*3/uL (ref 150–400)
RBC: 4.84 MIL/uL (ref 4.22–5.81)
RDW: 12.1 % (ref 11.5–15.5)
WBC: 8.4 10*3/uL (ref 4.0–10.5)
nRBC: 0 % (ref 0.0–0.2)

## 2019-11-01 LAB — COMPREHENSIVE METABOLIC PANEL
ALT: 20 U/L (ref 0–44)
AST: 18 U/L (ref 15–41)
Albumin: 3.5 g/dL (ref 3.5–5.0)
Alkaline Phosphatase: 67 U/L (ref 38–126)
Anion gap: 8 (ref 5–15)
BUN: 12 mg/dL (ref 6–20)
CO2: 26 mmol/L (ref 22–32)
Calcium: 9 mg/dL (ref 8.9–10.3)
Chloride: 106 mmol/L (ref 98–111)
Creatinine, Ser: 1 mg/dL (ref 0.61–1.24)
GFR calc Af Amer: >60 mL/min (ref >60)
GFR calc non Af Amer: >60 mL/min (ref >60)
Glucose, Bld: 106 mg/dL — ABNORMAL HIGH (ref 70–99)
Potassium: 4.1 mmol/L (ref 3.5–5.1)
Sodium: 140 mmol/L (ref 135–145)
Total Bilirubin: 0.6 mg/dL (ref 0.3–1.2)
Total Protein: 8 g/dL (ref 6.5–8.1)

## 2019-11-01 LAB — RAPID URINE DRUG SCREEN, HOSP PERFORMED
Amphetamines: NOT DETECTED
Barbiturates: NOT DETECTED
Benzodiazepines: NOT DETECTED
Cocaine: NOT DETECTED
Opiates: NOT DETECTED
Tetrahydrocannabinol: NOT DETECTED

## 2019-11-01 LAB — SALICYLATE LEVEL: Salicylate Lvl: <7 mg/dL — ABNORMAL LOW (ref 7.0–30.0)

## 2019-11-01 LAB — ACETAMINOPHEN LEVEL: Acetaminophen (Tylenol), Serum: <10 ug/mL — ABNORMAL LOW (ref 10–30)

## 2019-11-01 LAB — ETHANOL: Alcohol, Ethyl (B): <10 mg/dL (ref <10)

## 2019-11-01 LAB — RESPIRATORY PANEL BY RT PCR (FLU A&B, COVID)
Influenza A by PCR: NEGATIVE
Influenza B by PCR: NEGATIVE
SARS Coronavirus 2 by RT PCR: NEGATIVE

## 2019-11-01 MED ORDER — ZIPRASIDONE HCL 20 MG PO CAPS
20.0000 mg | ORAL_CAPSULE | Freq: Once | ORAL | Status: AC
  Administered 2019-11-01: 22:00:00 20 mg via ORAL
  Filled 2019-11-01: qty 1

## 2019-11-01 MED ORDER — PALIPERIDONE ER 6 MG PO TB24
6.0000 mg | ORAL_TABLET | Freq: Every day | ORAL | Status: DC
  Administered 2019-11-01 – 2019-11-03 (×3): 6 mg via ORAL
  Filled 2019-11-01 (×4): qty 1

## 2019-11-01 MED ORDER — MIRTAZAPINE 15 MG PO TABS
15.0000 mg | ORAL_TABLET | Freq: Every day | ORAL | Status: DC
  Administered 2019-11-01 – 2019-11-02 (×2): 15 mg via ORAL
  Filled 2019-11-01 (×2): qty 1

## 2019-11-01 MED ORDER — ENSURE ENLIVE PO LIQD
237.0000 mL | Freq: Two times a day (BID) | ORAL | Status: DC
  Administered 2019-11-01 – 2019-11-02 (×3): 237 mL via ORAL
  Filled 2019-11-01 (×3): qty 237

## 2019-11-01 MED ORDER — HYDROXYZINE HCL 50 MG PO TABS
50.0000 mg | ORAL_TABLET | Freq: Four times a day (QID) | ORAL | Status: DC | PRN
  Administered 2019-11-02 – 2019-11-03 (×2): 50 mg via ORAL
  Filled 2019-11-01 (×2): qty 1

## 2019-11-01 MED ORDER — ATENOLOL 25 MG PO TABS
12.5000 mg | ORAL_TABLET | Freq: Every day | ORAL | Status: DC
  Administered 2019-11-01 – 2019-11-03 (×3): 12.5 mg via ORAL
  Filled 2019-11-01 (×3): qty 1

## 2019-11-01 MED ORDER — NICOTINE 21 MG/24HR TD PT24
21.0000 mg | MEDICATED_PATCH | Freq: Every day | TRANSDERMAL | Status: DC
  Administered 2019-11-02 – 2019-11-03 (×2): 21 mg via TRANSDERMAL
  Filled 2019-11-01 (×2): qty 1
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
## 2019-11-01 NOTE — ED Notes (Signed)
Behavior Health talking with pt via telepsych at this time.  Pt remains quiet and cooperative at this time
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
## 2019-11-01 NOTE — ED Notes (Signed)
Dinner Tray Ordered @ 1740.
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
## 2019-11-01 NOTE — ED Notes (Signed)
Pt wanded by security.  Pt resting quietly at this time.  Warm blanket given to pt.  Pt talked to his mom via telephone

## 2019-11-01 NOTE — ED Notes (Signed)
Pt states he is not suicidal but does want to hurt others-
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
## 2019-11-02 MED ORDER — ACETAMINOPHEN 325 MG PO TABS
650.0000 mg | ORAL_TABLET | ORAL | Status: DC | PRN
  Administered 2019-11-02: 650 mg via ORAL
  Filled 2019-11-02: qty 2
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
## 2019-11-02 NOTE — ED Notes (Signed)
Pt awake and alert. Pt updated on plan of care and TTS this am.

## 2019-11-02 NOTE — ED Notes (Signed)
SW working with ACT team to determine appropriate DC plans.  Pt is homeless and likely unable to return to previous shelter.  SW to contact mother for options. Pt restful at this time.

## 2019-11-02 NOTE — ED Notes (Signed)
Patient got up asking for gloves; pt was informed gloves are for staff; pt states his hands are cold, he then proceeds to look through the trash can and pick up used gloves out of the trash and takes them in the room; pt was directed to throw gloves away due to germs and to sanitize his hands. Patient redirected back to his room; Patient is now sitting in room calm-Monique,RN

## 2019-11-02 NOTE — ED Provider Notes (Signed)
SW states patient will go to group home tomorrow, but needs to stay overnight otherwise.   Sherwood Gambler, MD 11/02/19 [phone]

## 2019-11-02 NOTE — ED Notes (Signed)
Arrangements made for pt to enter into a group home setting tomorrow in [place].  MD orders written for continued stay.  Pt aware and agreeable.  Attempted phone call to mother with no answer.

## 2019-11-02 NOTE — ED Notes (Signed)
Waiting for disp from [facility].   Reviewed POC with pt, ACT will coordinate with SW on pt f/u.  Pt aware.  We may call ACT team upon DC if needed for a ride.  Pt agrees at this time.

## 2019-11-02 NOTE — ED Notes (Signed)
Pt perseverating on when he gets to to talk to counselor.  Remains cooperative and able to redirect.
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
## 2019-11-02 NOTE — Progress Notes (Signed)
Pt remains psychiatrically cleared. [name] of [facility] (RN) reports that pt will have a bed at a group home in [place], [place] tomorrow. [facility] will come to pick pt up before noon tomorrow to transport pt to his new residence. The group home is geographically close to the [facility] office. They are requesting that pt stay overnight at [facility] ED. [facility] ED provider has agreed. [name] has been notified.   Audree Camel, LCSW, [place] Disposition [facility] [phone]

## 2019-11-02 NOTE — ED Notes (Signed)
Lunch Tray Ordered @ 6734

## 2019-11-02 NOTE — ED Notes (Signed)
Breakfast Ordered 

## 2019-11-03 MED ORDER — ATENOLOL 25 MG PO TABS: 12.5000 mg | ORAL_TABLET | Freq: Every day | ORAL | 0 refills | Status: DC

## 2019-11-03 MED ORDER — PALIPERIDONE ER 6 MG PO TB24: 6.0000 mg | ORAL_TABLET | Freq: Every day | ORAL | 0 refills | Status: DC

## 2019-11-03 MED ORDER — MIRTAZAPINE 15 MG PO TABS: 15.0000 mg | ORAL_TABLET | Freq: Every day | ORAL | 0 refills | Status: AC

## 2019-11-03 MED ORDER — MIRTAZAPINE 15 MG PO TABS: 15.0000 mg | ORAL_TABLET | Freq: Every day | ORAL | 0 refills | Status: DC

## 2019-11-03 MED ORDER — HYDROXYZINE HCL 50 MG PO TABS: 50.0000 mg | ORAL_TABLET | Freq: Four times a day (QID) | ORAL | 0 refills | Status: DC | PRN

## 2019-11-03 NOTE — ED Provider Notes (Signed)
Patient seen by psychiatry and cleared.  IVC papers were rescinded.  He apparently is being placed in a group home later today.  His lab work is reassuring.  Heart rate improved to the 86.  Patient to be discharged to group home in care of the act team.  Prescriptions have been given.   BP 98/74 (BP Location: Right Arm)   Pulse 86 Comment: RN asked pt to push fluids- he has had 4 cups of water  Temp 98.2 F (36.8 C) (Oral)   Resp 18   Ht 6' (1.829 m)   Wt 83.9 kg   SpO2 97%   BMI 25.09 kg/m      Christian Essex, MD 11/03/19 [phone]

## 2019-11-03 NOTE — ED Notes (Signed)
Patient in room talking to himself

## 2019-11-03 NOTE — Progress Notes (Signed)
CSW received phone call from [place] at [facility]. She verifies that pt has been accepted to a group home in [place] and will be picked up by group home staff (Jan) before noon today. Earnest Bailey asks that pt call her at the [facility] ACT crisis line this morning, so she can talk with him about the placement. She reports that pt has this number.   Audree Camel, LCSW, [place] Disposition [facility] [phone]

## 2019-11-03 NOTE — ED Notes (Addendum)
Pt is getting dressed and ACT coordinator, Earnest Bailey, is here. Belongings given. MD at bedside.

## 2020-09-25 ENCOUNTER — Other Ambulatory Visit: Payer: Self-pay

## 2020-09-25 ENCOUNTER — Ambulatory Visit: Payer: Medicaid Other | Attending: Internal Medicine

## 2020-09-25 DIAGNOSIS — Z23 Encounter for immunization: Secondary | ICD-10-CM

## 2020-09-25 NOTE — Progress Notes (Signed)
   Covid-19 Vaccination Clinic  Name:  Christian Lamb    MRN: [id number] DOB: [birth year]  09/25/2020  Christian Lamb was observed post Covid-19 immunization for 15 minutes without incident. He was provided with Vaccine Information Sheet and instruction to access the V-Safe system.   Christian Lamb was instructed to call 911 with any severe reactions post vaccine: Marland Kitchen Difficulty breathing  . Swelling of face and throat  . A fast heartbeat  . A bad rash all over body  . Dizziness and weakness   Immunizations Administered    Name Date Dose VIS Date Route   Moderna Covid-19 Booster Vaccine 09/25/2020 12:45 PM 0.25 mL 07/26/2020 Intramuscular   Manufacturer: Moderna   Lot: [id number]   [place]: [id number]

## 2023-11-18 ENCOUNTER — Ambulatory Visit (INDEPENDENT_AMBULATORY_CARE_PROVIDER_SITE_OTHER): Payer: MEDICAID | Admitting: Internal Medicine

## 2023-11-18 ENCOUNTER — Encounter: Payer: Self-pay | Admitting: Internal Medicine

## 2023-11-18 ENCOUNTER — Other Ambulatory Visit: Payer: Self-pay

## 2023-11-18 VITALS — BP 120/80 | HR 100 | Temp 98.3°F | Resp 16 | Ht 71.5 in | Wt 145.2 lb

## 2023-11-18 DIAGNOSIS — J449 Chronic obstructive pulmonary disease, unspecified: Secondary | ICD-10-CM | POA: Diagnosis not present

## 2023-11-18 DIAGNOSIS — D638 Anemia in other chronic diseases classified elsewhere: Secondary | ICD-10-CM

## 2023-11-18 DIAGNOSIS — K91850 Pouchitis: Secondary | ICD-10-CM

## 2023-11-18 DIAGNOSIS — L0291 Cutaneous abscess, unspecified: Secondary | ICD-10-CM

## 2023-11-18 DIAGNOSIS — Z8739 Personal history of other diseases of the musculoskeletal system and connective tissue: Secondary | ICD-10-CM | POA: Insufficient documentation

## 2023-11-18 DIAGNOSIS — F250 Schizoaffective disorder, bipolar type: Secondary | ICD-10-CM

## 2023-11-18 DIAGNOSIS — I10 Essential (primary) hypertension: Secondary | ICD-10-CM

## 2023-11-18 DIAGNOSIS — N3281 Overactive bladder: Secondary | ICD-10-CM

## 2023-11-18 DIAGNOSIS — Z131 Encounter for screening for diabetes mellitus: Secondary | ICD-10-CM

## 2023-11-18 DIAGNOSIS — Z13220 Encounter for screening for lipoid disorders: Secondary | ICD-10-CM

## 2023-11-18 MED ORDER — ZINC OXIDE 40 % EX OINT
1.0000 | TOPICAL_OINTMENT | CUTANEOUS | 0 refills | Status: AC | PRN
Start: 2023-11-18 — End: ?

## 2023-11-18 NOTE — Assessment & Plan Note (Signed)
BP controlled, continue Metoprolol 100 mg XL.

## 2023-11-18 NOTE — Assessment & Plan Note (Signed)
Following with Psychiatry, medications reviewed. Will screening for lipid disorders today.

## 2023-11-18 NOTE — Assessment & Plan Note (Signed)
Stable, on Oxybutynin.

## 2023-11-18 NOTE — Assessment & Plan Note (Signed)
Not officially diagnosed ? Current smoker but trying to decrease. Continue Albuterol PRN and Advair.

## 2023-11-18 NOTE — Progress Notes (Signed)
New Patient Office Visit  Subjective    Patient ID: Christian Lamb, male    DOB: 1976-05-06  Age: 48 y.o. MRN: [id number]  CC:  Chief Complaint  Patient presents with   Establish Care    HPI Christian Lamb presents to establish care. He is here with a caregiver from the group home where he resides. He does have a small healing abscess on his right buttock that he first noticed months ago. No pain, swelling, redness or drainage. He is using a cream on it.   Schizophrenia/Schizoaffective disorder, bipolar type/anxiety: -Following with Psychiatry  -Currently on Invega 6 mg IM, Zoloft 25 mg, Zyprexa 5 mg, Remeron 15 mg, Ativan 1 mg  Pouchitis/Ulcerative Colitis: -Following with GI at [facility], last seen 08/14/23. Seen yearly. 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 -Pouchoscopy 04/2019 - diffuse inflammation, moderate in severity and characterized by congestion, erosions, friability and loss of vascularity and shallow ulcerations in the distal pre-pouch ileum.   AoCD: -Hgb 11/24 12.4, ferritin normal. -Currently on iron supplements daily  HTN/Chronic Tachycardia: -Currently on Metoprolol 100 mg XL  Hx of Osteomyelitis: -s/p digits 1-5 amputated on the right foot in 2019, fifth digit amputated on the left as well -Following with Podiatry, last seen 12/13/22 -Uncertain if this is due to diabetes, no documentation. Blood sugar normal on labs from November, not on medications and no recent A1c  Overactive Bladder:  -Currently on Oxybutynin 15 mg XL  COPD: -COPD status: stable -Current medications: Advair and Albuterol  -Satisfied with current treatment?: yes -Oxygen use: no -Limitation of activity: no -Productive cough:  -Last Spirometry/PFTs:  -Pneumovax: unknown -Influenza: Up to Date    Outpatient  Encounter Medications as of 11/18/2023  Medication Sig   acetaminophen (TYLENOL) 500 MG tablet Take 500 mg by mouth every 6 (six) hours as needed.   albuterol (VENTOLIN HFA) 108 (90 Base) MCG/ACT inhaler Inhale 2 puffs into the lungs every 6 (six) hours as needed for wheezing or shortness of breath.   Cholecalciferol 100 MCG (4000 UT) CAPS Take by mouth.   ferrous sulfate 324 MG TBEC Take 324 mg by mouth.   liver oil-zinc oxide (DESITIN) 40 % ointment Apply 1 Application topically as needed for irritation.   loperamide (IMODIUM) 2 MG capsule Take by mouth as needed for diarrhea or loose stools.   LORazepam (ATIVAN) 1 MG tablet Take 1 mg by mouth at bedtime.   metoprolol succinate (TOPROL-XL) 100 MG 24 hr tablet Take 100 mg by mouth daily.   mirtazapine (REMERON) 15 MG tablet Take 1 tablet (15 mg total) by mouth at bedtime.   Multiple Vitamin (MULTI-VITAMIN) tablet Take 1 tablet by mouth daily.   mupirocin ointment (BACTROBAN) 2 % Apply topically.   Nutritional Supplements (CARNATION BREAKFAST ESSENTIALS PO) Take by mouth.   OLANZapine (ZYPREXA) 5 MG tablet Take 5 mg by mouth.   oxybutynin (DITROPAN XL) 15 MG 24 hr tablet Take 15 mg by mouth daily.   sertraline (ZOLOFT) 25 MG tablet Take 25 mg by mouth daily.   SYMBICORT 160-4.5 MCG/ACT inhaler Inhale 2 puffs into the lungs 2 (two) times daily.   tolnaftate (TINACTIN) 1 % cream Apply 1 Application topically 2 (two) times daily.   [DISCONTINUED] paliperidone (INVEGA SUSTENNA) 234 MG/1.5ML SUSY injection Inject 234 mg into the muscle once for 1 dose. (  Patient taking differently: Inject 234 mg into the muscle every 28 (twenty-eight) days.)   [DISCONTINUED] paliperidone (INVEGA) 6 MG 24 hr tablet Take 1 tablet (6 mg total) by mouth daily.   paliperidone (INVEGA SUSTENNA) 234 MG/1.5ML injection Inject 234 mg into the muscle every 28 (twenty-eight) days.   [DISCONTINUED] atenolol (TENORMIN) 25 MG tablet Take 0.5 tablets (12.5 mg total) by mouth daily.  (Patient not taking: Reported on 11/18/2023)   [DISCONTINUED] ciprofloxacin (CIPRO) 500 MG tablet Take 500 mg by mouth 2 (two) times daily. (Patient not taking: Reported on 11/18/2023)   [DISCONTINUED] GNP MELATONIN 3 MG TABS Take 3 mg by mouth at bedtime.  (Patient not taking: Reported on 11/18/2023)   [DISCONTINUED] hydrOXYzine (ATARAX/VISTARIL) 50 MG tablet Take 1 tablet (50 mg total) by mouth every 6 (six) hours as needed for anxiety (agitation). (Patient not taking: Reported on 11/18/2023)   [DISCONTINUED] LORazepam (ATIVAN) 1 MG tablet Take 1 mg by mouth daily as needed. (Patient not taking: Reported on 11/18/2023)   [DISCONTINUED] metroNIDAZOLE (FLAGYL) 500 MG tablet Take 500 mg by mouth 2 (two) times daily. (Patient not taking: Reported on 11/18/2023)   [DISCONTINUED] paliperidone (INVEGA) 6 MG 24 hr tablet Take 6 mg by mouth daily. (Patient not taking: Reported on 11/18/2023)   No facility-administered encounter medications on file as of 11/18/2023.    Past Medical History:  Diagnosis Date   AKI (acute kidney injury) (HCC) 12/05/2017   Anemia    Anemia of chronic disease 12/05/2017   Anxiety    Chronic tachycardia 01/13/2015   Elevated liver enzymes 12/05/2017   Loss of weight 03/21/2017   Necrotic toes (HCC) 01/16/2018   Osteomyelitis (HCC) 01/2018   RIGHT FOOT   Protein-calorie malnutrition, severe 12/05/2017   Schizophrenia (HCC)    Sepsis (HCC) 12/04/2017   Toe osteomyelitis (HCC) 03/05/2018   Ulcerative colitis [facility])     Past Surgical History:  Procedure Laterality Date   AMPUTATION Right 01/18/2018   Procedure: AMPUTATION OF TOES;  Surgeon: Nadara Mustard, MD;  Location: [facility] OR;  Service: Orthopedics;  Laterality: Right;   POUCHOSCOPY N/A 05/06/2019   Procedure: POUCHOSCOPY;  Surgeon: Toney Reil, MD;  Location[facility]COPY;  Service: Gastroenterology;  Laterality: N/A;   SMALL INTESTINE SURGERY  2016   in [place]   TOTAL COLECTOMY  2003    Family History  Problem Relation  Age of Onset   Colon cancer Father 45   Stomach cancer Neg Hx     Social History   Socioeconomic History   Marital status: Single    Spouse name: Not on file   Number of children: Not on file   Years of education: Not on file   Highest education level: Not on file  Occupational History   Not on file  Tobacco Use   Smoking status: Every Day    Current packs/day: 0.25    Types: Cigarettes, Cigars    Passive exposure: Current   Smokeless tobacco: Never  Vaping Use   Vaping status: Some Days   Substances: Nicotine, Flavoring  Substance and Sexual Activity   Alcohol use: No    Alcohol/week: 0.0 standard drinks of alcohol   Drug use: No   Sexual activity: Not Currently  Other Topics Concern   Not on file  Social History Narrative   Patient in independent care home. [facility] in [place]   Social Drivers of Health   Financial Resource Strain: Low Risk  (03/05/2018)  Received from [facility], [facility]   Overall Financial Resource Strain (CARDIA)    Difficulty of Paying Living Expenses: Not hard at all  Food Insecurity: No Food Insecurity (03/05/2018)   Received from [facility], [facility]   Hunger Vital Sign    Worried About Running Out of Food in the Last Year: Never true    Ran Out of Food in the Last Year: Never true  Transportation Needs: No Transportation Needs (03/05/2018)   Received from [facility], [facility]   [facility] - Transportation    Lack of Transportation (Medical): No    Lack of Transportation (Non-Medical): No  Physical Activity: Insufficiently Active (03/05/2018)   Received from [facility], [facility]   Exercise Vital Sign    Days of Exercise per Week: 7 days    Minutes of Exercise per Session: 20 min  Stress: No Stress Concern Present (03/05/2018)   Received from [facility], [facility] of Occupational Health - Occupational Stress Questionnaire     Feeling of Stress : Only a little  Social Connections: Moderately Isolated (03/05/2018)   Received from [facility], [facility]   Social Connection and Isolation Panel [NHANES]    Frequency of Communication with Friends and Family: Once a week    Frequency of Social Gatherings with Friends and Family: Twice a week    Attends Religious Services: More than 4 times per year    Active Member of [employer] or Organizations: No    Attends Banker Meetings: Never    Marital Status: Never married  Catering manager Violence: Not At Risk (03/05/2018)   Received from [facility], [facility]   Humiliation, Afraid, Rape, and Kick questionnaire    Fear of Current or Ex-Partner: No    Emotionally Abused: No    Physically Abused: No    Sexually Abused: No    Review of Systems  All other systems reviewed and are negative.       Objective    BP 120/80 (Cuff Size: Large)   Pulse 100   Temp 98.3 F (36.8 C) (Oral)   Resp 16   Ht 5' 11.5" (1.816 m)   Wt 145 lb 3.2 oz (65.9 kg)   SpO2 99%   BMI 19.97 kg/m   Physical Exam Constitutional:      Appearance: Normal appearance.  HENT:     Head: Normocephalic and atraumatic.     Mouth/Throat:     Mouth: Mucous membranes are moist.     Pharynx: Oropharynx is clear.  Eyes:     Extraocular Movements: Extraocular movements intact.     Conjunctiva/sclera: Conjunctivae normal.     Pupils: Pupils are equal, round, and reactive to light.  Neck:     Comments: No thyromegaly Cardiovascular:     Rate and Rhythm: Normal rate and regular rhythm.  Pulmonary:     Effort: Pulmonary effort is normal.     Breath sounds: Normal breath sounds.  Musculoskeletal:     Cervical back: No tenderness.     Right lower leg: No edema.     Left lower leg: No edema.  Lymphadenopathy:     Cervical: No cervical adenopathy.  Skin:    General: Skin is warm and dry.     Comments: Small healing abscess on posterior right buttock, no infection  or drainage  Neurological:     General: No focal  deficit present.     Mental Status: He is alert. Mental status is at baseline.  Psychiatric:        Mood and Affect: Mood normal.        Behavior: Behavior normal.     Last CBC Lab Results  Component Value Date   WBC 8.4 11/01/2019   HGB 13.9 11/01/2019   HCT 45.0 11/01/2019   MCV 93.0 11/01/2019   MCH 28.7 11/01/2019   RDW 12.1 11/01/2019   PLT 248 11/01/2019   Last metabolic panel Lab Results  Component Value Date   GLUCOSE 106 (H) 11/01/2019   NA 140 11/01/2019   K 4.1 11/01/2019   CL 106 11/01/2019   CO2 26 11/01/2019   BUN 12 11/01/2019   CREATININE 1.00 11/01/2019   GFRNONAA >60 11/01/2019   CALCIUM 9.0 11/01/2019   PHOS 4.0 04/28/2019   PROT 8.0 11/01/2019   ALBUMIN 3.5 11/01/2019   LABGLOB 4.6 (H) 04/28/2019   AGRATIO 0.8 (L) 04/28/2019   BILITOT 0.6 11/01/2019   ALKPHOS 67 11/01/2019   AST 18 11/01/2019   ALT 20 11/01/2019   ANIONGAP 8 11/01/2019   Last lipids Lab Results  Component Value Date   CHOL 136 02/12/2016   HDL 36 (L) 02/12/2016   LDLCALC 90 02/12/2016   TRIG 49 02/12/2016   CHOLHDL 3.8 02/12/2016   Last hemoglobin A1c Lab Results  Component Value Date   HGBA1C 4.9 02/12/2016   Last thyroid functions Lab Results  Component Value Date   TSH 1.505 12/06/2017   Last vitamin D No results found for: "25OHVITD2", "25OHVITD3", "VD25OH" Last vitamin B12 and Folate Lab Results  Component Value Date   VITAMINB12 1,422 (H) 04/28/2019   FOLATE >20.0 04/28/2019        Assessment & Plan:  Schizoaffective disorder, bipolar type [facility]) Assessment & Plan: Following with Psychiatry, medications reviewed. Will screening for lipid disorders today.   Ileal pouchitis (HCC) Assessment & Plan: Following with [facility], note reviewed from 08/14/23, only on Loperamide at meals.   Chronic obstructive pulmonary disease, unspecified COPD type (HCC) Assessment & Plan: Not officially diagnosed ?  Current smoker but trying to decrease. Continue Albuterol PRN and Advair.   Anemia in other chronic diseases classified elsewhere Assessment & Plan: Hemoglobin and iron levels stable from November, continue iron supplements.   Hypertension, unspecified type Assessment & Plan: BP controlled, continue Metoprolol 100 mg XL.   Overactive bladder Assessment & Plan: Stable, on Oxybutynin.    History of osteomyelitis Assessment & Plan: Reviewed podiatry note from 12/13/22. Will screen for diabetes.  Orders: -     Hemoglobin A1c  Screening for diabetes mellitus -     Hemoglobin A1c  Lipid screening -     Lipid panel  Abscess -     Zinc Oxide; Apply 1 Application topically as needed for irritation.  Dispense: 56.7 g; Refill: 0  Screening labs ordered. Abscess healing, due to location will order barrier cream.  Return in about 6 months (around 05/17/2024).   Margarita Mail, DO

## 2023-11-18 NOTE — Assessment & Plan Note (Signed)
Hemoglobin and iron levels stable from November, continue iron supplements.

## 2023-11-18 NOTE — Assessment & Plan Note (Signed)
Reviewed podiatry note from 12/13/22. Will screen for diabetes.

## 2023-11-18 NOTE — Assessment & Plan Note (Signed)
Following with [facility], note reviewed from 08/14/23, only on Loperamide at meals.

## 2023-11-19 LAB — HEMOGLOBIN A1C
Hgb A1c MFr Bld: 4.8 %{Hb} (ref <5.7)
Mean Plasma Glucose: 91 mg/dL
eAG (mmol/L): 5 mmol/L

## 2023-11-19 LAB — LIPID PANEL
Cholesterol: 146 mg/dL (ref <200)
HDL: 38 mg/dL — ABNORMAL LOW (ref >=40)
LDL Cholesterol (Calc): 90 mg/dL
Non-HDL Cholesterol (Calc): 108 mg/dL (ref <130)
Total CHOL/HDL Ratio: 3.8 (calc) (ref <5.0)
Triglycerides: 88 mg/dL (ref <150)

## 2024-02-25 ENCOUNTER — Emergency Department: Payer: MEDICAID

## 2024-02-25 ENCOUNTER — Other Ambulatory Visit: Payer: Self-pay

## 2024-02-25 ENCOUNTER — Emergency Department
Admission: EM | Admit: 2024-02-25 | Discharge: 2024-02-25 | Disposition: A | Discharge status: Home / Self Care | Payer: MEDICAID | Attending: Emergency Medicine | Admitting: Emergency Medicine

## 2024-02-25 DIAGNOSIS — N390 Urinary tract infection, site not specified: Secondary | ICD-10-CM | POA: Insufficient documentation

## 2024-02-25 DIAGNOSIS — W1839XA Other fall on same level, initial encounter: Secondary | ICD-10-CM | POA: Insufficient documentation

## 2024-02-25 DIAGNOSIS — W19XXXA Unspecified fall, initial encounter: Secondary | ICD-10-CM

## 2024-02-25 DIAGNOSIS — J449 Chronic obstructive pulmonary disease, unspecified: Secondary | ICD-10-CM | POA: Insufficient documentation

## 2024-02-25 LAB — COMPREHENSIVE METABOLIC PANEL WITH GFR
ALT: 12 U/L (ref 0–44)
AST: 15 U/L (ref 15–41)
Albumin: 2.8 g/dL — ABNORMAL LOW (ref 3.5–5.0)
Alkaline Phosphatase: 54 U/L (ref 38–126)
Anion gap: 6 (ref 5–15)
BUN: 7 mg/dL (ref 6–20)
CO2: 27 mmol/L (ref 22–32)
Calcium: 8.9 mg/dL (ref 8.9–10.3)
Chloride: 108 mmol/L (ref 98–111)
Creatinine, Ser: 1.01 mg/dL (ref 0.61–1.24)
GFR, Estimated: >60 mL/min (ref >60)
Glucose, Bld: 88 mg/dL (ref 70–99)
Potassium: 3.7 mmol/L (ref 3.5–5.1)
Sodium: 141 mmol/L (ref 135–145)
Total Bilirubin: 0.6 mg/dL (ref 0.0–1.2)
Total Protein: 7.7 g/dL (ref 6.5–8.1)

## 2024-02-25 LAB — CBC
HCT: 34.7 % — ABNORMAL LOW (ref 39.0–52.0)
Hemoglobin: 11 g/dL — ABNORMAL LOW (ref 13.0–17.0)
MCH: 28.6 pg (ref 26.0–34.0)
MCHC: 31.7 g/dL (ref 30.0–36.0)
MCV: 90.1 fL (ref 80.0–100.0)
Platelets: 254 10*3/uL (ref 150–400)
RBC: 3.85 MIL/uL — ABNORMAL LOW (ref 4.22–5.81)
RDW: 13.2 % (ref 11.5–15.5)
WBC: 7.8 10*3/uL (ref 4.0–10.5)
nRBC: 0 % (ref 0.0–0.2)

## 2024-02-25 LAB — URINALYSIS, ROUTINE W REFLEX MICROSCOPIC
Bilirubin Urine: NEGATIVE
Glucose, UA: NEGATIVE mg/dL
Ketones, ur: NEGATIVE mg/dL
Nitrite: POSITIVE — AB
Protein, ur: NEGATIVE mg/dL
Specific Gravity, Urine: 1.009 (ref 1.005–1.030)
WBC, UA: >50 WBC/hpf (ref 0–5)
pH: 6 (ref 5.0–8.0)

## 2024-02-25 LAB — CK: Total CK: 182 U/L (ref 49–397)

## 2024-02-25 LAB — TROPONIN I (HIGH SENSITIVITY): Troponin I (High Sensitivity): 3 ng/L (ref <18)

## 2024-02-25 LAB — CBG MONITORING, ED: Glucose-Capillary: 74 mg/dL (ref 70–99)

## 2024-02-25 MED ORDER — SODIUM CHLORIDE 0.9 % IV BOLUS
1000.0000 mL | Freq: Once | INTRAVENOUS | Status: AC
  Administered 2024-02-25: 1000 mL via INTRAVENOUS

## 2024-02-25 MED ORDER — CEPHALEXIN 500 MG PO CAPS: 500.0000 mg | ORAL_CAPSULE | Freq: Two times a day (BID) | ORAL | 0 refills | Status: DC

## 2024-02-25 MED ORDER — SODIUM CHLORIDE 0.9 % IV SOLN
1.0000 g | INTRAVENOUS | Status: AC
  Administered 2024-02-25: 1 g via INTRAVENOUS
  Filled 2024-02-25: qty 10

## 2024-02-25 NOTE — ED Triage Notes (Signed)
 Pt to ED [facility] from home for fall and AMS. Pt unsure if fell. Pt answering questions correctly but does appear mildly confused. Denies dizziness and pain. States may have fallen last night when got up to use bathroom. Pt unsure if has any medical problems.

## 2024-02-25 NOTE — ED Provider Notes (Signed)
 [facility] Provider Note    Event Date/Time   First MD Initiated Contact with Patient 02/25/24 1144     (approximate)   History   Altered Mental Status and Fall   HPI  Christian Lamb is a 48 y.o. male history of schizoaffective disorder, schizophrenia, COPD   Patient reports that he has been at independent living for a while now.  Last night he got up and he fell.  Thinks he fell next to his bed he was getting up to use the bathroom.  He tells me that he recently moved from a group home to independent living, independent living brings him food and checks on him.  He has not had any headache neck pain chest pain difficulty breathing or other concerns, but he is not quite certain if he might have passed out but does recall getting up to use the bathroom and then awakening on the ground   Also spoke with patient's friend JaVon Enoch, he advises patient used to live in a group home and now is independent living.  best he knows patient does not have a guardian but is followed by  service called "[facility]  Physical Exam   Triage Vital Signs: ED Triage Vitals  Encounter Vitals Group     BP 02/25/24 1131 116/72     Systolic BP Percentile --      Diastolic BP Percentile --      Pulse Rate 02/25/24 1131 (!) 104     Resp 02/25/24 1131 20     Temp 02/25/24 1131 98.2 F (36.8 C)     Temp Source 02/25/24 1131 Oral     SpO2 02/25/24 1131 100 %     Weight 02/25/24 1129 145 lb (65.8 kg)     Height 02/25/24 1129 6' (1.829 m)     Head Circumference --      Peak Flow --      Pain Score 02/25/24 1130 0     Pain Loc --      Pain Education --      Exclude from Growth Chart --     Most recent vital signs: Vitals:   02/25/24 1222 02/25/24 1400  BP: 107/81 107/82  Pulse: 80 63  Resp:  12  Temp:    SpO2: 100% 100%     General: Awake, no distress.  He is alert and oriented at this time, gives me history similar to his friend who reported the patient  just moved from group home to independent living.  Patient reports he now lives at independent living, gets follow-up care through [facility] Mucous membranes are moist CV:  Good peripheral perfusion.  Normal tones and rate Resp:  Normal effort.  Clear bilateral Abd:  No distention.  Soft nontender nondistended old laparotomy scar Other:  Moves all extremities well.  Extraocular moods are normal.  He is very pleasant and well-oriented at this time No incontinence.  No lip or mouth injuries  ED Results / Procedures / Treatments   Labs (all labs ordered are listed, but only abnormal results are displayed) Labs Reviewed  COMPREHENSIVE METABOLIC PANEL WITH GFR - Abnormal; Notable for the following components:      Result Value   Albumin 2.8 (*)    All other components within normal limits  CBC - Abnormal; Notable for the following components:   RBC 3.85 (*)    Hemoglobin 11.0 (*)    HCT 34.7 (*)    All other components  within normal limits  URINALYSIS, ROUTINE W REFLEX MICROSCOPIC - Abnormal; Notable for the following components:   Color, Urine YELLOW (*)    APPearance HAZY (*)    Hgb urine dipstick SMALL (*)    Nitrite POSITIVE (*)    Leukocytes,Ua LARGE (*)    Bacteria, UA RARE (*)    All other components within normal limits  URINE CULTURE  CK  CBG MONITORING, ED  TROPONIN I (HIGH SENSITIVITY)     EKG  And interpreted by me at 1148 heart rate 90 QRS 70 QTc 440 Normal sinus rhythm, mild repolarization abnormality not consistent with acute MI   RADIOLOGY  CT Head Wo Contrast Result Date: 02/25/2024 CLINICAL DATA:  Trauma, possible fall EXAM: CT HEAD WITHOUT CONTRAST CT CERVICAL SPINE WITHOUT CONTRAST TECHNIQUE: Multidetector CT imaging of the head and cervical spine was performed following the standard protocol without intravenous contrast. Multiplanar CT image reconstructions of the cervical spine were also generated. RADIATION DOSE REDUCTION: This exam was performed  according to the departmental dose-optimization program which includes automated exposure control, adjustment of the mA and/or kV according to patient size and/or use of iterative reconstruction technique. COMPARISON:  None Available. FINDINGS: CT HEAD FINDINGS Brain: No evidence of acute infarction, hemorrhage, hydrocephalus, extra-axial collection or mass lesion/mass effect. Vascular: No hyperdense vessel or unexpected calcification. Skull: Postoperative findings of prior craniotomy involving the frontoparietal cranial vertex. Negative for fracture or focal lesion. Sinuses/Orbits: No acute finding. Other: None. CT CERVICAL SPINE FINDINGS Alignment: Normal. Skull base and vertebrae: No acute fracture. No primary bone lesion or focal pathologic process. Soft tissues and spinal canal: No prevertebral fluid or swelling. No visible canal hematoma. Disc levels:  Intact. Upper chest: Negative. Other: None. IMPRESSION: 1. No acute intracranial pathology. 2. Postoperative findings of prior craniotomy involving the frontoparietal cranial vertex. 3. No fracture or static subluxation of the cervical spine. Electronically Signed   By: Fredricka Jenny M.D.   On: 02/25/2024 13:14   CT Cervical Spine Wo Contrast Result Date: 02/25/2024 CLINICAL DATA:  Trauma, possible fall EXAM: CT HEAD WITHOUT CONTRAST CT CERVICAL SPINE WITHOUT CONTRAST TECHNIQUE: Multidetector CT imaging of the head and cervical spine was performed following the standard protocol without intravenous contrast. Multiplanar CT image reconstructions of the cervical spine were also generated. RADIATION DOSE REDUCTION: This exam was performed according to the departmental dose-optimization program which includes automated exposure control, adjustment of the mA and/or kV according to patient size and/or use of iterative reconstruction technique. COMPARISON:  None Available. FINDINGS: CT HEAD FINDINGS Brain: No evidence of acute infarction, hemorrhage, hydrocephalus,  extra-axial collection or mass lesion/mass effect. Vascular: No hyperdense vessel or unexpected calcification. Skull: Postoperative findings of prior craniotomy involving the frontoparietal cranial vertex. Negative for fracture or focal lesion. Sinuses/Orbits: No acute finding. Other: None. CT CERVICAL SPINE FINDINGS Alignment: Normal. Skull base and vertebrae: No acute fracture. No primary bone lesion or focal pathologic process. Soft tissues and spinal canal: No prevertebral fluid or swelling. No visible canal hematoma. Disc levels:  Intact. Upper chest: Negative. Other: None. IMPRESSION: 1. No acute intracranial pathology. 2. Postoperative findings of prior craniotomy involving the frontoparietal cranial vertex. 3. No fracture or static subluxation of the cervical spine. Electronically Signed   By: Fredricka Jenny M.D.   On: 02/25/2024 13:14       PROCEDURES:  Critical Care performed: No  Procedures   MEDICATIONS ORDERED IN ED: Medications  cefTRIAXone (ROCEPHIN) 1 g in sodium chloride  0.9 % 100 mL IVPB (1  g Intravenous New Bag/Given 02/25/24 1510)  sodium chloride  0.9 % bolus 1,000 mL (0 mLs Intravenous Stopped 02/25/24 1302)     IMPRESSION / MDM / ASSESSMENT AND PLAN / ED COURSE  I reviewed the triage vital signs and the nursing notes.                              Differential diagnosis includes, but is not limited to, possible fall, mechanical fall syncope, consideration for cause such as seizure is also given as there is reports that he was confused but not seems alert and well oriented.  Somewhat hard to delineate as he also seems to have a fairly flattened affect and a history of schizophrenia.  He is calm pleasant oriented normocephalic atraumatic without complaint at this time.  Initially just slightly tachycardic.  Will hydrate, check labs CBC metabolic panel CT head.  ECG shows no prolonged QT  Patient's presentation is most consistent with acute complicated illness / injury  requiring diagnostic workup.   The patient is on the cardiac monitor to evaluate for evidence of arrhythmia and/or significant heart rate changes.  Reviewed telemetry, patient remains in normal sinus rhythm (315p)   ----------------------------------------- 3:22 PM on 02/25/2024 ----------------------------------------- Urinalysis suggestive of associated urinary tract infection.  Patient fully awake alert oriented.  Discussed with him he reports he was treated for urinary tract infection a couple of months ago.  He is not having any obvious overt symptoms but in a male patient it does seem suspicious for UTI possibly in association with his fall.  Given Rocephin, will prescribe cephalexin.  Patient advises he has  county services that can help him and fill and providing, request sent to his requested [place] pharmacy by [employer].  Return precautions and treatment recommendations and follow-up discussed with the patient who is agreeable with the plan.  Fully awake alert no evidence of sepsis or acute cardiovascular instability.  Patient appropriate for discharge        FINAL CLINICAL IMPRESSION(S) / ED DIAGNOSES   Final diagnoses:  Fall, initial encounter  Lower urinary tract infectious disease     Rx / DC Orders   ED Discharge Orders          Ordered    cephALEXin (KEFLEX) 500 MG capsule  2 times daily        02/25/24 1521             Note:  This document was prepared using Dragon voice recognition software and may include unintentional dictation errors.   Iver Marker, MD 02/25/24 [phone]

## 2024-02-25 NOTE — ED Notes (Signed)
 Attempted urine sample, Pt unable to provide at this time.

## 2024-02-25 NOTE — ED Notes (Signed)
 Fall precautions in place for Pt. This RN placed fall band, fall grip socks, bed alarm and fall sign.

## 2024-02-25 NOTE — ED Triage Notes (Signed)
 First nurse note: Pt here via [facility] from home, pt wasn't seen for awhile and family states pt is not acting himself. Pt is A&OX4 per EMS.   18G R AC  107/76 HR: 80's  99% RA 98.4 Oral  CBG 103

## 2024-02-25 NOTE — ED Notes (Signed)
 Pt tells this RN he he feels better after the fluids because he hasn't been drinking water. States he has had packs of soda and tea.

## 2024-02-28 LAB — URINE CULTURE: Culture: >=100000 — AB

## 2024-03-11 ENCOUNTER — Other Ambulatory Visit: Payer: Self-pay | Admitting: Internal Medicine

## 2024-03-11 NOTE — Telephone Encounter (Unsigned)
 Copied from CRM [id number]. Topic: Clinical - Medication Refill >> Mar 11, 2024 12:08 PM DeAngela L wrote: Medication: loperamide  (IMODIUM ) 2 MG capsule  Has the patient contacted their pharmacy? Yes  (Agent: If no, request that the patient contact the pharmacy for the refill. If patient does not wish to contact the pharmacy document the reason why and proceed with request.) (Agent: If yes, when and what did the pharmacy advise?)  This is the patient's preferred pharmacy:   [id number] - [place], [place] - [address] [place] [address] Phone: [phone] Fax: [phone]  Is this the correct pharmacy for this prescription? Yes  If no, delete pharmacy and type the correct one.   Has the prescription been filled recently? Yes   Is the patient out of the medication? Yes   Has the patient been seen for an appointment in the last year OR does the patient have an upcoming appointment? Yes  Can we respond through MyChart? No  Agent: Please be advised that Rx refills may take up to 3 business days. We ask that you follow-up with your pharmacy.

## 2024-03-12 MED ORDER — LOPERAMIDE HCL 2 MG PO CAPS: 2.0000 mg | ORAL_CAPSULE | ORAL | 0 refills | Status: DC | PRN

## 2024-03-12 NOTE — Telephone Encounter (Signed)
 Requested medications are due for refill today.  yes  Requested medications are on the active medications list.  yes  Last refill. 11/18/2023 #30 0 rf  Future visit scheduled.   yes  Notes to clinic.  Medication is historical.    Requested Prescriptions  Pending Prescriptions Disp Refills   loperamide  (IMODIUM ) 2 MG capsule 30 capsule     Sig: Take by mouth as needed for diarrhea or loose stools.     Over the Counter:  OTC Passed - 03/12/2024 12:14 PM      Passed - Valid encounter within last 12 months    Recent Outpatient Visits           3 months ago Schizoaffective disorder, bipolar type [facility])   [facility] [place] Rockney Cid, DO       Future Appointments             In 2 months Rockney Cid, DO [facility], [facility]

## 2024-03-17 NOTE — Telephone Encounter (Signed)
 Copied from CRM [id number]. Topic: Clinical - Prescription Issue >> Mar 11, 2024 11:59 AM DeAngela L wrote: Reason for CRM: Rudene Corti NP with [employer] her call back number for any questions [phone]  fax number [phone] she would like a list of the medication the patient is taking and please send the refill for the patient also  Patient switched pharmacies and need all prescriptions sent over    And patient lives in independent living now and this is why he need additional help making sure he has all his current medication sent to his new pharmacy listed below     [id number] - [place], [place] - [address] [place] [address] Phone: [phone] Fax: [phone]

## 2024-04-06 ENCOUNTER — Other Ambulatory Visit: Payer: Self-pay

## 2024-04-06 ENCOUNTER — Emergency Department
Admission: EM | Admit: 2024-04-06 | Discharge: 2024-04-06 | Disposition: A | Discharge status: Home / Self Care | Payer: MEDICAID | Attending: Emergency Medicine | Admitting: Emergency Medicine

## 2024-04-06 DIAGNOSIS — E860 Dehydration: Secondary | ICD-10-CM | POA: Diagnosis not present

## 2024-04-06 DIAGNOSIS — R531 Weakness: Secondary | ICD-10-CM | POA: Diagnosis present

## 2024-04-06 LAB — COMPREHENSIVE METABOLIC PANEL WITH GFR
ALT: 11 U/L (ref 0–44)
AST: 15 U/L (ref 15–41)
Albumin: 3 g/dL — ABNORMAL LOW (ref 3.5–5.0)
Alkaline Phosphatase: 55 U/L (ref 38–126)
Anion gap: 8 (ref 5–15)
BUN: 14 mg/dL (ref 6–20)
CO2: 26 mmol/L (ref 22–32)
Calcium: 9 mg/dL (ref 8.9–10.3)
Chloride: 105 mmol/L (ref 98–111)
Creatinine, Ser: 0.87 mg/dL (ref 0.61–1.24)
GFR, Estimated: >60 mL/min (ref >60)
Glucose, Bld: 82 mg/dL (ref 70–99)
Potassium: 4.1 mmol/L (ref 3.5–5.1)
Sodium: 139 mmol/L (ref 135–145)
Total Bilirubin: 0.8 mg/dL (ref 0.0–1.2)
Total Protein: 7.8 g/dL (ref 6.5–8.1)

## 2024-04-06 LAB — URINALYSIS, ROUTINE W REFLEX MICROSCOPIC
Bacteria, UA: NONE SEEN
Bilirubin Urine: NEGATIVE
Glucose, UA: NEGATIVE mg/dL
Hgb urine dipstick: NEGATIVE
Ketones, ur: NEGATIVE mg/dL
Nitrite: NEGATIVE
Protein, ur: NEGATIVE mg/dL
Specific Gravity, Urine: 1.018 (ref 1.005–1.030)
pH: 6 (ref 5.0–8.0)

## 2024-04-06 LAB — CBC
HCT: 36.4 % — ABNORMAL LOW (ref 39.0–52.0)
Hemoglobin: 11.4 g/dL — ABNORMAL LOW (ref 13.0–17.0)
MCH: 28.3 pg (ref 26.0–34.0)
MCHC: 31.3 g/dL (ref 30.0–36.0)
MCV: 90.3 fL (ref 80.0–100.0)
Platelets: 262 10*3/uL (ref 150–400)
RBC: 4.03 MIL/uL — ABNORMAL LOW (ref 4.22–5.81)
RDW: 13.2 % (ref 11.5–15.5)
WBC: 6.2 10*3/uL (ref 4.0–10.5)
nRBC: 0 % (ref 0.0–0.2)

## 2024-04-06 LAB — LIPASE, BLOOD: Lipase: 22 U/L (ref 11–51)

## 2024-04-06 MED ORDER — SODIUM CHLORIDE 0.9 % IV BOLUS
1000.0000 mL | Freq: Once | INTRAVENOUS | Status: AC
  Administered 2024-04-06: 1000 mL via INTRAVENOUS

## 2024-04-06 NOTE — ED Notes (Signed)
 Returned call to Javon (contact person) [phone] and updated as to patient's status.

## 2024-04-06 NOTE — ED Notes (Signed)
 Attempted contact to Christian Lamb to pick up pt.

## 2024-04-06 NOTE — ED Notes (Signed)
 This nurse gave pt graham crackers and orange juice per pt's request.

## 2024-04-06 NOTE — ED Provider Notes (Signed)
 [facility] Provider Note    Event Date/Time   First MD Initiated Contact with Patient 04/06/24 1622     (approximate)  History   Chief Complaint: Failure To Thrive  HPI  Christian Lamb is a 48 y.o. male with a past medical history of schizophrenia, anxiety, anemia, presents to the emergency department for generalized weakness concerns for dehydration and weight loss.  I spoke to the patient's prior group home caregiver who states that the patient has recently (approximately 1 month ago) been moved to independent living.  He states he had called the patient today but no one had answered.  He ended up going to the patient's house and was knocking on the door and it took the patient 5 or 10 minutes to answer the door per the caregiver.  Once he answered the door he was very weak acting.  The caregiver noted that he had lost a substantial amount of weight since leaving the group home and he was concerned so he called EMS to bring the patient to the emergency department.  Here the patient is awake alert, no distress.  He is very thin appearing.  Vital signs showed mild tachycardia and the patient does have somewhat appearing dry mucous membranes.      Physical Exam   Triage Vital Signs: ED Triage Vitals  Encounter Vitals Group     BP 04/06/24 1257 97/63     Girls Systolic BP Percentile --      Girls Diastolic BP Percentile --      Boys Systolic BP Percentile --      Boys Diastolic BP Percentile --      Pulse Rate 04/06/24 1257 (!) 111     Resp 04/06/24 1257 20     Temp 04/06/24 1257 98.8 F (37.1 C)     Temp Source 04/06/24 1257 Oral     SpO2 04/06/24 1257 97 %     Weight --      Height 04/06/24 1256 6' 2 (1.88 m)     Head Circumference --      Peak Flow --      Pain Score 04/06/24 1256 0     Pain Loc --      Pain Education --      Exclude from Growth Chart --     Most recent vital signs: Vitals:   04/06/24 1257 04/06/24 1540  BP: 97/63 96/76   Pulse: (!) 111 (!) 110  Resp: 20 18  Temp: 98.8 F (37.1 C) 98.2 F (36.8 C)  SpO2: 97% 99%    General: Awake, no distress.  Somewhat dry mucous membranes CV:  Good peripheral perfusion.  Regular rate and rhythm around 100 to 110 bpm Resp:  Normal effort.  Equal breath sounds bilaterally.  Abd:  No distention.  Soft, nontender.  No rebound or guarding.  ED Results / Procedures / Treatments   MEDICATIONS ORDERED IN ED: Medications  sodium chloride  0.9 % bolus 1,000 mL (has no administration in time range)     IMPRESSION / MDM / ASSESSMENT AND PLAN / ED COURSE  I reviewed the triage vital signs and the nursing notes.  Patient's presentation is most consistent with acute presentation with potential threat to life or bodily function.  Patient presents to the emergency department for medical evaluation and possible dehydration.  Patient recently moved to independent living from a group facility.  The prior group facility caregiver states the patient has lost a significant amount of  weight since leaving the group home.  States today when he saw the patient he was concerned that he was dehydrated.  Patient's lab work today shows overall reassuring chemistry with a normal anion gap, reassuring CBC and lipase.  Patient does have somewhat dry appearing mucous membranes and is tachycardic around 100 bpm.  We will IV hydrate the patient.  Will attempt to obtain a urine sample as well to rule out infection.  Group home caregiver was concerned because there was very little food in the patient's apartment and he is not sure that the patient knows how to cook or prepare his own meals.  He is concerned that the patient may not be thriving in independent care and may need additional help at home.  We will IV hydrate the patient in the emergency department.  Will consult social work to have them further investigate to see if the patient may require further care at home or may need a group home type facility  to adequately care for self.  Patient is very thin but otherwise no significant findings on exam.  Social worker has been in contact with the patient's group home provider.  He has additional home resources set up.  They will work on attempting to get the patient additional resources at home.  Social work states the patient is safe for discharge home from their standpoint they will continue to work after discharge with the patient's Child psychotherapist and group home Production designer, theatre/television/film to obtain further help for the patient.  Patient's lab work today shows no significant findings on his urinalysis, reassuring CBC and chemistry as well as a lipase.  Patient has been IV hydrated and his heart rate did slow from 110-76 likely indicating more dehydration.  Discussed with the patient to encourage plenty of oral hydration.  FINAL CLINICAL IMPRESSION(S) / ED DIAGNOSES   Dehydration Weakness   Note:  This document was prepared using Dragon voice recognition software and may include unintentional dictation errors.   Dorothyann Drivers, MD 04/06/24 2117

## 2024-04-06 NOTE — Discharge Instructions (Signed)
 Please drink plenty of fluids.  Please follow-up with your doctor.  Return to the emergency department for any symptom concerning to yourself.

## 2024-04-06 NOTE — Care Management (Signed)
 Consult as the patient changed [place] group home to [place] He has lost weight and is not eating at home, he states he does not feel hungry.  He may be a good candidate for paramedic services, this would be set up with his primary.  Called Mr Helen He does have a peer support worker and [employer]. Does attend a day program, that is when they noticed he was not looking like himself. He does not know how  he cooks. He has a psychiatrist Rayne Pepper [phone] Mr. Helen states he can pick him up if it is not too late, otherwise he can get a cab home.

## 2024-04-06 NOTE — ED Triage Notes (Signed)
 Pt via POV from home. Pt is with Javon Enoch who is the owner of the group home the pt used to stay at. Pt was transitioned to independent living on April 29th. Per Mr. Helen, pt has not been eating and has lost a significant amount of weight since being d/c from the group home. Pt denies any NVD or pain just states he doesn't feel like eating. Pt is A&Ox4 and NAD, ambulatory to triage.

## 2024-04-08 NOTE — Telephone Encounter (Unsigned)
 Copied from CRM [id number]. Topic: General - Other >> Apr 08, 2024 12:00 PM Turkey B wrote: Russell from [employer] called in says patient needs capps program, not eating well.

## 2024-04-12 ENCOUNTER — Inpatient Hospital Stay: Payer: MEDICAID | Admitting: Internal Medicine

## 2024-05-04 ENCOUNTER — Other Ambulatory Visit: Payer: Self-pay

## 2024-05-04 ENCOUNTER — Emergency Department
Admission: EM | Admit: 2024-05-04 | Discharge: 2024-05-04 | Disposition: A | Discharge status: Home / Self Care | Payer: MEDICAID | Attending: Emergency Medicine | Admitting: Emergency Medicine

## 2024-05-04 DIAGNOSIS — F259 Schizoaffective disorder, unspecified: Secondary | ICD-10-CM | POA: Diagnosis not present

## 2024-05-04 DIAGNOSIS — R197 Diarrhea, unspecified: Secondary | ICD-10-CM | POA: Diagnosis present

## 2024-05-04 DIAGNOSIS — K529 Noninfective gastroenteritis and colitis, unspecified: Secondary | ICD-10-CM | POA: Diagnosis not present

## 2024-05-04 LAB — COMPREHENSIVE METABOLIC PANEL WITH GFR
ALT: 13 U/L (ref 0–44)
AST: 14 U/L — ABNORMAL LOW (ref 15–41)
Albumin: 3 g/dL — ABNORMAL LOW (ref 3.5–5.0)
Alkaline Phosphatase: 61 U/L (ref 38–126)
Anion gap: 8 (ref 5–15)
BUN: 20 mg/dL (ref 6–20)
CO2: 29 mmol/L (ref 22–32)
Calcium: 9.1 mg/dL (ref 8.9–10.3)
Chloride: 106 mmol/L (ref 98–111)
Creatinine, Ser: 0.77 mg/dL (ref 0.61–1.24)
GFR, Estimated: >60 mL/min (ref >60)
Glucose, Bld: 99 mg/dL (ref 70–99)
Potassium: 3.4 mmol/L — ABNORMAL LOW (ref 3.5–5.1)
Sodium: 143 mmol/L (ref 135–145)
Total Bilirubin: 0.7 mg/dL (ref 0.0–1.2)
Total Protein: 8.3 g/dL — ABNORMAL HIGH (ref 6.5–8.1)

## 2024-05-04 LAB — CBC WITH DIFFERENTIAL/PLATELET
Abs Immature Granulocytes: 0.02 K/uL (ref 0.00–0.07)
Basophils Absolute: 0 K/uL (ref 0.0–0.1)
Basophils Relative: 0 %
Eosinophils Absolute: 0.1 K/uL (ref 0.0–0.5)
Eosinophils Relative: 1 %
HCT: 37.6 % — ABNORMAL LOW (ref 39.0–52.0)
Hemoglobin: 11.3 g/dL — ABNORMAL LOW (ref 13.0–17.0)
Immature Granulocytes: 0 %
Lymphocytes Relative: 23 %
Lymphs Abs: 1.4 K/uL (ref 0.7–4.0)
MCH: 28.3 pg (ref 26.0–34.0)
MCHC: 30.1 g/dL (ref 30.0–36.0)
MCV: 94.2 fL (ref 80.0–100.0)
Monocytes Absolute: 0.4 K/uL (ref 0.1–1.0)
Monocytes Relative: 7 %
Neutro Abs: 4.1 K/uL (ref 1.7–7.7)
Neutrophils Relative %: 69 %
Platelets: 292 K/uL (ref 150–400)
RBC: 3.99 MIL/uL — ABNORMAL LOW (ref 4.22–5.81)
RDW: 13.3 % (ref 11.5–15.5)
WBC: 6 K/uL (ref 4.0–10.5)
nRBC: 0 % (ref 0.0–0.2)

## 2024-05-04 MED ORDER — METRONIDAZOLE 500 MG PO TABS
500.0000 mg | ORAL_TABLET | Freq: Once | ORAL | Status: AC
  Administered 2024-05-04: 500 mg via ORAL
  Filled 2024-05-04: qty 1

## 2024-05-04 MED ORDER — LOPERAMIDE HCL 2 MG PO CAPS: 2.0000 mg | ORAL_CAPSULE | Freq: Three times a day (TID) | ORAL | 0 refills | Status: DC | PRN

## 2024-05-04 MED ORDER — METRONIDAZOLE 500 MG PO TABS: 500.0000 mg | ORAL_TABLET | Freq: Two times a day (BID) | ORAL | 0 refills | Status: DC

## 2024-05-04 MED ORDER — SODIUM CHLORIDE 0.9 % IV BOLUS
1000.0000 mL | Freq: Once | INTRAVENOUS | Status: AC
  Administered 2024-05-04: 1000 mL via INTRAVENOUS

## 2024-05-04 NOTE — ED Provider Notes (Signed)
 [facility] Provider Note    Event Date/Time   First MD Initiated Contact with Patient 05/04/24 1902     (approximate)   History   Chief Complaint: No chief complaint on file.   HPI  Christian Lamb is a 48 y.o. male with a history of schizophrenia, ulcerative colitis who comes ED complaining of chronic diarrhea, noted some blood in his diarrhea today.  Diarrhea has been going on for many years ever since having resection of his large intestine and part of his small intestine.  Denies pain or fever.  Outside records reviewed noting that patient has seen [facility] GI IBD clinic in the past.  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        Past Medical History:  Diagnosis Date   AKI (acute kidney injury) (HCC) 12/05/2017   Anemia    Anemia of chronic disease 12/05/2017   Anxiety    Chronic tachycardia 01/13/2015   Elevated liver enzymes 12/05/2017   Loss of weight 03/21/2017   Necrotic toes (HCC) 01/16/2018   Osteomyelitis (HCC) 01/2018   RIGHT FOOT   Protein-calorie malnutrition, severe 12/05/2017   Schizophrenia (HCC)    Sepsis (HCC) 12/04/2017   Toe osteomyelitis (HCC) 03/05/2018   Ulcerative colitis [facility])     Current Outpatient Rx   Order #: [id number] Class: Normal   Order #: [id number] Class: Historical Med   Order #: [id number] Class: Historical Med   Order #: [id number] Class: Normal   Order #: [id number] Class: Historical Med   Order #: [id number] Class: Historical Med   Order #: [id number] Class: Normal   Order #: [id number] Class: Normal   Order #: [id number] Class: Historical Med   Order #: [id number] Class: Historical Med   Order #: [id number] Class: Print   Order #: [id number] Class: Historical Med   Order #: [id number] Class: Historical Med   Order #: [id number] Class: Historical Med   Order #: [id number] Class: Historical Med   Order #: [id number] Class: Historical Med   Order #: [id number] Class: Historical Med   Order #:  [id number] Class: Historical Med   Order #: [id number] Class: Historical Med   Order #: [id number] Class: Historical Med    Past Surgical History:  Procedure Laterality Date   AMPUTATION Right 01/18/2018   Procedure: AMPUTATION OF TOES;  Surgeon: Harden Jerona GAILS, MD;  Location: [facility] OR;  Service: Orthopedics;  Laterality: Right;   POUCHOSCOPY N/A 05/06/2019   Procedure: POUCHOSCOPY;  Surgeon: Unk Corinn Skiff, MD;  Location[facility] ENDOSCOPY;  Service: Gastroenterology;  Laterality: N/A;   SMALL INTESTINE SURGERY  2016   in [place]   TOTAL COLECTOMY  2003    Physical Exam   Triage Vital Signs: ED Triage Vitals [05/04/24 1840]  Encounter Vitals Group     BP      Girls Systolic BP Percentile      Girls Diastolic BP Percentile      Boys Systolic BP Percentile      Boys Diastolic BP Percentile      Pulse      Resp      Temp      Temp src      SpO2      Weight 114 lb (51.7 kg)     Height 6' 2 (1.88 m)     Head Circumference      Peak Flow      Pain Score 10     Pain Loc      Pain Education      Exclude from Growth Chart  Most recent vital signs: Vitals:   05/04/24 2001 05/04/24 2002  BP: (!) 120/95   Pulse: 85   Resp: 16   Temp: 97.9 F (36.6 C)   SpO2: 100% 100%    General: Awake, no distress.  CV:  Good peripheral perfusion.  Regular rate rhythm Resp:  Normal effort.  Clear to auscultation Abd:  No distention.  Soft nontender Other:  Moist oral mucosa   ED Results / Procedures / Treatments   Labs (all labs ordered are listed, but only abnormal results are displayed) Labs Reviewed  CBC WITH DIFFERENTIAL/PLATELET - Abnormal; Notable for the following components:      Result Value   RBC 3.99 (*)    Hemoglobin 11.3 (*)    HCT 37.6 (*)    All other components within normal limits  COMPREHENSIVE METABOLIC PANEL WITH GFR - Abnormal; Notable for the following components:   Potassium 3.4 (*)    Total Protein 8.3 (*)    Albumin 3.0 (*)    AST 14 (*)    All  other components within normal limits  URINALYSIS, W/ REFLEX TO CULTURE (INFECTION SUSPECTED)     EKG Interpreted by me Sinus rhythm rate of 92.  Normal axis intervals QRS ST segments T waves   RADIOLOGY    PROCEDURES:  Procedures   MEDICATIONS ORDERED IN ED: Medications  sodium chloride  0.9 % bolus 1,000 mL (1,000 mLs Intravenous Bolus from Bag 05/04/24 1919)  metroNIDAZOLE  (FLAGYL ) tablet 500 mg (500 mg Oral Given 05/04/24 1955)     IMPRESSION / MDM / ASSESSMENT AND PLAN / ED COURSE  I reviewed the triage vital signs and the nursing notes.  DDx: AKI, electrolyte derangement, dehydration, anemia, enteritis  Patient's presentation is most consistent with acute presentation with potential threat to life or bodily function.  Patient with ABD status post bowel resection comes ED complaining of chronic diarrhea and noting some blood in his stool today.  Denies melena.  No dizziness or fatigue or chills.  Vital signs are normal, hemoglobin is baseline.  He appears mildly dehydrated, will give IV fluids.  Will start a course of Flagyl  for enteritis, possible pouchitis and recommend follow-up with gastroenterology.       FINAL CLINICAL IMPRESSION(S) / ED DIAGNOSES   Final diagnoses:  Chronic diarrhea  Schizoaffective disorder, unspecified type (HCC)     Rx / DC Orders   ED Discharge Orders          Ordered    metroNIDAZOLE  (FLAGYL ) 500 MG tablet  2 times daily        05/04/24 1944             Note:  This document was prepared using Dragon voice recognition software and may include unintentional dictation errors.   Viviann Pastor, MD 05/04/24 2018

## 2024-05-04 NOTE — ED Triage Notes (Signed)
 First Nurse Note: Patient to ED via [facility] from home for diarrhea x6 months. States today he noticed blood in stools. VS WNL

## 2024-05-04 NOTE — ED Triage Notes (Signed)
 Pt states he's lost a lot of weight and every time he eats he has to use the bathroom. Has had blood in his stool once before years ago. Says it did not require a blood transfusion.

## 2024-05-17 ENCOUNTER — Encounter: Payer: Self-pay | Admitting: Internal Medicine

## 2024-05-17 ENCOUNTER — Ambulatory Visit: Payer: MEDICAID | Admitting: Internal Medicine

## 2024-05-17 ENCOUNTER — Other Ambulatory Visit: Payer: Self-pay

## 2024-05-17 VITALS — BP 118/74 | HR 84 | Temp 98.2°F | Resp 18 | Ht 71.75 in | Wt 124.4 lb

## 2024-05-17 DIAGNOSIS — F250 Schizoaffective disorder, bipolar type: Secondary | ICD-10-CM

## 2024-05-17 DIAGNOSIS — Z23 Encounter for immunization: Secondary | ICD-10-CM

## 2024-05-17 DIAGNOSIS — K91850 Pouchitis: Secondary | ICD-10-CM

## 2024-05-17 DIAGNOSIS — J449 Chronic obstructive pulmonary disease, unspecified: Secondary | ICD-10-CM | POA: Diagnosis not present

## 2024-05-17 DIAGNOSIS — D638 Anemia in other chronic diseases classified elsewhere: Secondary | ICD-10-CM

## 2024-05-17 DIAGNOSIS — I10 Essential (primary) hypertension: Secondary | ICD-10-CM

## 2024-05-17 NOTE — Progress Notes (Signed)
 Established Patient Office Visit  Subjective    Patient ID: Christian Lamb, male    DOB: 1975/12/28  Age: 48 y.o. MRN: [id number]  CC:  Chief Complaint  Patient presents with   Medical Management of Chronic Issues    6 month recheck    HPI Christian Lamb presents to follow up on chronic medical conditions. He is here with this caretaker today.   Discussed the use of AI scribe software for clinical note transcription with the patient, who gave verbal consent to proceed.  History of Present Illness Christian Lamb is a 48 year old male with inflammatory bowel disease who presents for a follow-up visit.  He experienced a recent exacerbation of his inflammatory bowel disease, but his diarrhea has resolved, and his last bowel movement was normal without blood. He is unsure about a follow-up appointment with his gastroenterologist at [facility]. His current medications include Remeron , Zyprexa, Zoloft, Invega , metoprolol , and Flagyl  for his inflammatory bowel disease. He uses a rescue inhaler twice daily for COPD, which he finds effective, and also uses Advair as a maintenance inhaler. His hemoglobin was stable 13 days ago.   Schizophrenia/Schizoaffective disorder, bipolar type/anxiety: -Following with Psychiatry  -Currently on Invega  6 mg IM, Zoloft 25 mg, Zyprexa 5 mg, Remeron  15 mg, Ativan  1 mg -No changes, doing well  Pouchitis/Ulcerative Colitis: -Following with GI at [facility], last seen 08/14/23. Seen yearly. 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 -Pouchoscopy 04/2019 - diffuse inflammation, moderate in severity and characterized by congestion, erosions, friability and loss of vascularity and shallow ulcerations in the distal pre-pouch ileum.  -Had an episode of diarrhea last month, was treated with Flagyl     AoCD: -Hgb 7/25 11.3 -Currently on iron supplements daily  HTN/Chronic Tachycardia: -Currently on Metoprolol  100 mg XL  Hx of Osteomyelitis: -s/p digits 1-5 amputated on the right foot in 2019, fifth digit amputated on the left as well -Following with Podiatry -No history of diabetes, A1c 4.8% 2/25  Overactive Bladder:  -Currently on Oxybutynin 15 mg XL  COPD: -COPD status: stable -Current medications: Advair and Albuterol   -Satisfied with current treatment?: yes -Oxygen use: no -Limitation of activity: no -Productive cough:  -Last Spirometry/PFTs:  -Pneumovax: unknown -Influenza: Up to Date  Health Maintenance: -Blood work UTD -Tdap due  Outpatient Encounter Medications as of 05/17/2024  Medication Sig   acetaminophen  (TYLENOL ) 500 MG tablet Take 500 mg by mouth every 6 (six) hours as needed.   albuterol  (VENTOLIN  HFA) 108 (90 Base) MCG/ACT inhaler Inhale 2 puffs into the lungs every 6 (six) hours as needed for wheezing or shortness of breath.   Cholecalciferol 100 MCG (4000 UT) CAPS Take by mouth.   ferrous sulfate 324 MG TBEC Take 324 mg by mouth.   liver oil-zinc  oxide (DESITIN) 40 % ointment Apply 1 Application topically as needed for irritation.   loperamide  (IMODIUM ) 2 MG capsule Take 1 capsule (2 mg total) by mouth 3 (three) times daily as needed for diarrhea or loose stools.   LORazepam  (ATIVAN ) 1 MG tablet Take 1 mg by mouth at bedtime.   metoprolol  succinate (TOPROL -XL) 100 MG 24 hr tablet Take 100 mg by mouth daily.   mirtazapine  (REMERON ) 15 MG tablet Take 1 tablet (15 mg total) by mouth at bedtime.   Multiple Vitamin (MULTI-VITAMIN) tablet Take 1 tablet by  mouth daily.   mupirocin ointment (BACTROBAN) 2 % Apply topically.   Nutritional Supplements (CARNATION BREAKFAST ESSENTIALS PO) Take by mouth.   OLANZapine (ZYPREXA) 5 MG tablet Take 5 mg by mouth.   oxybutynin (DITROPAN XL) 15 MG 24 hr tablet Take 15 mg by mouth daily.   paliperidone  (INVEGA   SUSTENNA) 234 MG/1.5ML injection Inject 234 mg into the muscle every 28 (twenty-eight) days.   sertraline (ZOLOFT) 25 MG tablet Take 25 mg by mouth daily.   SYMBICORT 160-4.5 MCG/ACT inhaler Inhale 2 puffs into the lungs 2 (two) times daily.   tolnaftate (TINACTIN) 1 % cream Apply 1 Application topically 2 (two) times daily.   cephALEXin  (KEFLEX ) 500 MG capsule Take 1 capsule (500 mg total) by mouth 2 (two) times daily. (Patient not taking: Reported on 05/17/2024)   metroNIDAZOLE  (FLAGYL ) 500 MG tablet Take 1 tablet (500 mg total) by mouth 2 (two) times daily. (Patient not taking: Reported on 05/17/2024)   No facility-administered encounter medications on file as of 05/17/2024.    Past Medical History:  Diagnosis Date   AKI (acute kidney injury) (HCC) 12/05/2017   Anemia    Anemia of chronic disease 12/05/2017   Anxiety    Chronic tachycardia 01/13/2015   Elevated liver enzymes 12/05/2017   Loss of weight 03/21/2017   Necrotic toes (HCC) 01/16/2018   Osteomyelitis (HCC) 01/2018   RIGHT FOOT   Protein-calorie malnutrition, severe 12/05/2017   Schizophrenia (HCC)    Sepsis (HCC) 12/04/2017   Toe osteomyelitis (HCC) 03/05/2018   Ulcerative colitis [facility])     Past Surgical History:  Procedure Laterality Date   AMPUTATION Right 01/18/2018   Procedure: AMPUTATION OF TOES;  Surgeon: Harden Jerona GAILS, MD;  Location: [facility] OR;  Service: Orthopedics;  Laterality: Right;   POUCHOSCOPY N/A 05/06/2019   Procedure: POUCHOSCOPY;  Surgeon: Unk Corinn Skiff, MD;  Location: [facility] ENDOSCOPY;  Service: Gastroenterology;  Laterality: N/A;   SMALL INTESTINE SURGERY  2016   in [place]   TOTAL COLECTOMY  2003    Family History  Problem Relation Age of Onset   Colon cancer Father 1   Stomach cancer Neg Hx     Social History   Socioeconomic History   Marital status: Single    Spouse name: Not on file   Number of children: Not on file   Years of education: Not on file   Highest education level: Not on file   Occupational History   Not on file  Tobacco Use   Smoking status: Every Day    Current packs/day: 0.25    Types: Cigarettes, Cigars    Passive exposure: Current   Smokeless tobacco: Never  Vaping Use   Vaping status: Some Days   Substances: Nicotine , Flavoring  Substance and Sexual Activity   Alcohol use: No    Alcohol/week: 0.0 standard drinks of alcohol   Drug use: No   Sexual activity: Not Currently  Other Topics Concern   Not on file  Social History Narrative   Patient in independent care home. [facility] in [place]   Social Drivers of Health   Financial Resource Strain: Low Risk  (05/04/2024)   Received from [facility]   Overall Financial Resource Strain (CARDIA)    Difficulty of Paying Living Expenses: Not hard at all  Food Insecurity: No Food Insecurity (05/04/2024)   Received from [facility]   Hunger Vital Sign    Within the past 12 months, you  worried that your food would run out before you got the money to buy more.: Never true    Within the past 12 months, the food you bought just didn't last and you didn't have money to get more.: Never true  Transportation Needs: No Transportation Needs (05/04/2024)   Received from [facility] - Transportation    In the past 12 months, has lack of transportation kept you from medical appointments or from getting medications?: No    Lack of Transportation (Non-Medical): No  Physical Activity: Insufficiently Active (03/05/2018)   Received from [facility]   Exercise Vital Sign    Days of Exercise per Week: 7 days    Minutes of Exercise per Session: 20 min  Stress: No Stress Concern Present (03/05/2018)   Received from [facility] - Occupational Stress Questionnaire    Feeling of Stress : Only a little  Social Connections: Moderately Isolated (03/05/2018)   Received from [facility]   Social Connection and Isolation Panel    Frequency of Communication with Friends and Family: Once a week    Frequency of Social Gatherings with Friends and Family: Twice a week    Attends Religious Services: More than 4 times per year    Active Member of [employer] or Organizations: No    Attends Banker Meetings: Never    Marital Status: Never married  Intimate Partner Violence: Not At Risk (03/05/2018)   Received from [facility]   Humiliation, Afraid, Rape, and Kick questionnaire    Fear of Current or Ex-Partner: No    Emotionally Abused: No    Physically Abused: No    Sexually Abused: No    Review of Systems  All other systems reviewed and are negative.       Objective    BP 118/74 (Cuff Size: Large)   Pulse 84   Temp 98.2 F (36.8 C) (Oral)   Resp 18   Ht 5' 11.75 (1.822 m)   Wt 124 lb 6.4 oz (56.4 kg)   SpO2 98%   BMI 16.99 kg/m   Physical Exam Constitutional:      Appearance: Normal appearance.  HENT:     Head: Normocephalic and atraumatic.     Mouth/Throat:     Mouth: Mucous membranes are moist.     Pharynx: Oropharynx is clear.  Eyes:     Extraocular Movements: Extraocular movements intact.     Conjunctiva/sclera: Conjunctivae normal.     Pupils: Pupils are equal, round, and reactive to light.  Cardiovascular:     Rate and Rhythm: Normal rate and regular rhythm.  Pulmonary:     Effort: Pulmonary effort is normal.     Breath sounds: Normal breath sounds.  Skin:    General: Skin is warm and dry.  Neurological:     General: No focal deficit present.     Mental Status: He is alert. Mental status is at baseline.  Psychiatric:        Mood and Affect: Mood normal.        Behavior: Behavior normal.     Last CBC Lab Results  Component Value Date   WBC 6.0 05/04/2024   HGB 11.3 (L) 05/04/2024   HCT 37.6 (L) 05/04/2024   MCV 94.2 05/04/2024   MCH 28.3 05/04/2024   RDW 13.3 05/04/2024   PLT 292 05/04/2024   Last metabolic  panel Lab Results  Component  Value Date   GLUCOSE 99 05/04/2024   NA 143 05/04/2024   K 3.4 (L) 05/04/2024   CL 106 05/04/2024   CO2 29 05/04/2024   BUN 20 05/04/2024   CREATININE 0.77 05/04/2024   GFRNONAA >60 05/04/2024   CALCIUM 9.1 05/04/2024   PHOS 4.0 04/28/2019   PROT 8.3 (H) 05/04/2024   ALBUMIN 3.0 (L) 05/04/2024   LABGLOB 4.6 (H) 04/28/2019   AGRATIO 0.8 (L) 04/28/2019   BILITOT 0.7 05/04/2024   ALKPHOS 61 05/04/2024   AST 14 (L) 05/04/2024   ALT 13 05/04/2024   ANIONGAP 8 05/04/2024   Last lipids Lab Results  Component Value Date   CHOL 146 11/18/2023   HDL 38 (L) 11/18/2023   LDLCALC 90 11/18/2023   TRIG 88 11/18/2023   CHOLHDL 3.8 11/18/2023   Last hemoglobin A1c Lab Results  Component Value Date   HGBA1C 4.8 11/18/2023   Last thyroid functions Lab Results  Component Value Date   TSH 1.505 12/06/2017   Last vitamin D No results found for: 25OHVITD2, 25OHVITD3, VD25OH Last vitamin B12 and Folate Lab Results  Component Value Date   VITAMINB12 1,422 (H) 04/28/2019   FOLATE >20.0 04/28/2019        Assessment & Plan:   Assessment & Plan Inflammatory bowel disease Inflammatory bowel disease post-exacerbation, no current symptoms. - Provided [facility] gastroenterology and hepatology clinic phone number. - Advised to confirm November appointment.  Chronic obstructive pulmonary disease (COPD) COPD managed with Advair and rescue inhaler, effective symptom control. - Continue current inhaler regimen.  Schizoaffective Disorder Stable, following with Psychiatry   -Medications reviewed, no changes.  Hypertension -Blood pressure stable here today, no changes made to medications.   Anemia Chronic anemia with stable hemoglobin. - Schedule follow-up labs in six months.  - Tdap vaccine greater than or equal to 7yo IM   Return in about 6 months (around 11/17/2024).   Sharyle Fischer, DO

## 2024-05-20 MED FILL — Sodium Chloride IV Soln 0.9%: INTRAVENOUS | Qty: 1000 | Status: AC

## 2024-06-15 ENCOUNTER — Emergency Department
Admission: EM | Admit: 2024-06-15 | Discharge: 2024-06-16 | Disposition: A | Discharge status: Other Acute Inpatient Hospital | Payer: MEDICAID | Attending: Emergency Medicine | Admitting: Emergency Medicine

## 2024-06-15 ENCOUNTER — Other Ambulatory Visit: Payer: Self-pay

## 2024-06-15 DIAGNOSIS — F23 Brief psychotic disorder: Secondary | ICD-10-CM

## 2024-06-15 DIAGNOSIS — Y900 Blood alcohol level of less than 20 mg/100 ml: Secondary | ICD-10-CM | POA: Insufficient documentation

## 2024-06-15 DIAGNOSIS — F17210 Nicotine dependence, cigarettes, uncomplicated: Secondary | ICD-10-CM | POA: Diagnosis not present

## 2024-06-15 DIAGNOSIS — F250 Schizoaffective disorder, bipolar type: Secondary | ICD-10-CM | POA: Insufficient documentation

## 2024-06-15 DIAGNOSIS — F29 Unspecified psychosis not due to a substance or known physiological condition: Secondary | ICD-10-CM | POA: Diagnosis not present

## 2024-06-15 LAB — CBC
HCT: 43.3 % (ref 39.0–52.0)
Hemoglobin: 14.1 g/dL (ref 13.0–17.0)
MCH: 28.6 pg (ref 26.0–34.0)
MCHC: 32.6 g/dL (ref 30.0–36.0)
MCV: 87.8 fL (ref 80.0–100.0)
Platelets: 309 K/uL (ref 150–400)
RBC: 4.93 MIL/uL (ref 4.22–5.81)
RDW: 11.9 % (ref 11.5–15.5)
WBC: 9.8 K/uL (ref 4.0–10.5)
nRBC: 0 % (ref 0.0–0.2)

## 2024-06-15 LAB — COMPREHENSIVE METABOLIC PANEL WITH GFR
ALT: 7 U/L (ref 0–44)
AST: 14 U/L — ABNORMAL LOW (ref 15–41)
Albumin: 3.3 g/dL — ABNORMAL LOW (ref 3.5–5.0)
Alkaline Phosphatase: 76 U/L (ref 38–126)
Anion gap: 11 (ref 5–15)
BUN: 30 mg/dL — ABNORMAL HIGH (ref 6–20)
CO2: 19 mmol/L — ABNORMAL LOW (ref 22–32)
Calcium: 9.7 mg/dL (ref 8.9–10.3)
Chloride: 104 mmol/L (ref 98–111)
Creatinine, Ser: 1.15 mg/dL (ref 0.61–1.24)
GFR, Estimated: >60 mL/min (ref >60)
Glucose, Bld: 111 mg/dL — ABNORMAL HIGH (ref 70–99)
Potassium: 4.3 mmol/L (ref 3.5–5.1)
Sodium: 134 mmol/L — ABNORMAL LOW (ref 135–145)
Total Bilirubin: 0.7 mg/dL (ref 0.0–1.2)
Total Protein: 9.3 g/dL — ABNORMAL HIGH (ref 6.5–8.1)

## 2024-06-15 LAB — ACETAMINOPHEN LEVEL: Acetaminophen (Tylenol), Serum: <10 ug/mL — ABNORMAL LOW (ref 10–30)

## 2024-06-15 LAB — ETHANOL: Alcohol, Ethyl (B): <15 mg/dL (ref <15)

## 2024-06-15 LAB — SALICYLATE LEVEL: Salicylate Lvl: <7 mg/dL — ABNORMAL LOW (ref 7.0–30.0)

## 2024-06-15 MED ORDER — IBUPROFEN 600 MG PO TABS: 600.0000 mg | ORAL_TABLET | Freq: Three times a day (TID) | ORAL | Status: DC | PRN

## 2024-06-15 MED ORDER — ALUM & MAG HYDROXIDE-SIMETH 200-200-20 MG/5ML PO SUSP: 30.0000 mL | Freq: Four times a day (QID) | ORAL | Status: DC | PRN

## 2024-06-15 MED ORDER — ONDANSETRON HCL 4 MG PO TABS: 4.0000 mg | ORAL_TABLET | Freq: Three times a day (TID) | ORAL | Status: DC | PRN
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
## 2024-06-15 NOTE — ED Notes (Signed)
 Pt dressed out into appropriate hospital provided scrubs with this tech, Dwayne, EDT, and [employer] in the rm. Pt belongings consist of: black/red tennis shoes, black pants, one black shirt, one red/black watch, one yellow necklace with a yellow cross, one black/blue wallet, one black jacket, one black phone, black pants, white socks, black belt, black underwear, multicolor lighter.
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
## 2024-06-16 MED ORDER — OLANZAPINE 10 MG PO TBDP: 5.0000 mg | ORAL_TABLET | Freq: Every day | ORAL | Status: DC

## 2024-06-16 NOTE — ED Notes (Signed)
 IVC/ pending assessment & psych consults

## 2024-06-16 NOTE — ED Notes (Signed)
 Patient discharged/transferred to [facility] and accompanied by [facility]. Personal belongings given to deputy

## 2024-06-16 NOTE — ED Notes (Signed)
 Pt given supplies for shower

## 2024-06-16 NOTE — ED Notes (Signed)
 Pt breakfast provided at bedside

## 2024-06-16 NOTE — Consult Note (Signed)
 [name] Psychiatric Consult Initial  Patient Name: .Christian Lamb  MRN: [id number]  DOB: [birth year]  Consult Order details:  Orders (From admission, onward)     Start     Ordered   06/15/24 2138  CONSULT TO CALL ACT TEAM       Ordering Provider: Viviann Pastor, MD  Provider:  (Not yet assigned)  Question:  Reason for Consult?  Answer:  Psych consult   06/15/24 2137   06/15/24 2138  IP CONSULT TO PSYCHIATRY       Ordering Provider: Viviann Pastor, MD  Provider:  (Not yet assigned)  Question Answer Comment  Consult Timeframe URGENT - requires response within 12 hours   URGENT timeframe requires provider to provider communication, has the provider to provider communication been completed Yes   Reason for Consult? Consult for medication management   Contact phone number where the requesting provider can be reached [phone]      06/15/24 2137             Mode of Visit: Tele-visit Virtual Statement:TELE PSYCHIATRY ATTESTATION & CONSENT As the provider for this telehealth consult, I attest that I verified the patient's identity using two separate identifiers, introduced myself to the patient, provided my credentials, disclosed my location, and performed this encounter via a HIPAA-compliant, real-time, face-to-face, two-way, interactive audio and video platform and with the full consent and agreement of the patient (or guardian as applicable.) Patient physical location: [facility]. Telehealth provider physical location: home office in state of [place] .   Video start time:   Video end time:      Psychiatry Consult Evaluation  Service Date: June 16, 2024 LOS:  LOS: 0 days  Chief Complaint Psychiatric Evaluation  Primary Psychiatric Diagnoses  Schizophrenia   Assessment  Christian Lamb is a 48 y.o. male admitted: Presented to the EDfor 06/15/2024  9:20 PM for psychiatric evaluation under IVC due to reported threats to burn down his  apartment complex and medication non-adherence. He carries the psychiatric diagnoses of schizophrenia and has a past medical history of tobacco use disorder.  His current presentation of calm, cooperative demeanor with denial of SI/HI/AVH, in the context of recent medication noncompliance and reported threatening statements is most consistent with schizophrenia, chronic, currently without acute psychotic symptoms but requiring stabilization due to risk behaviors and recent decompensation. He meets criteria for schizophrenia, by history, with recent exacerbation related to medication non-adherence based on IVC report of threats, history of schizophrenia, and admission of being off medications for several days.  Current outpatient psychotropic medications include Invega  (paliperidone ) long-acting injectable every 4 weeks, and historically he has had a good stabilizing response to this medication. He was partially compliant with medications prior to admission, as he has missed recent doses/injections, as evidenced by self-report of being off medications for several days.  On initial examination, patient is calm, cooperative, oriented, with no overt psychosis or agitation observed. He denies suicidal ideation, homicidal ideation, and auditory/visual hallucinations. Mood appears stable with appropriate affect. Insight is limited into reported threatening behavior, as he denies making statements referenced in IVC paperwork. Judgment is impaired given recent non-adherence to treatment and legal involvement. Sleep and appetite are adequate by report.  Diagnoses:  Active Hospital problems: Active Problems:   * No active hospital problems. *    Plan   ## Psychiatric Medication Recommendations:  Zyprexa  5 mg qhs  ## Medical Decision Making Capacity: Not specifically addressed in this encounter   ## Disposition:-- We recommend  inpatient psychiatric hospitalization after medical hospitalization. Patient has  been involuntarily committed on 06/15/2024.   ## Behavioral / Environmental: - No specific recommendations at this time.     ## Safety and Observation Level:  - Based on my clinical evaluation, I estimate the patient to be at low risk of self harm in the current setting. - At this time, we recommend  routine. This decision is based on my review of the chart including patient's history and current presentation, interview of the patient, mental status examination, and consideration of suicide risk including evaluating suicidal ideation, plan, intent, suicidal or self-harm behaviors, risk factors, and protective factors. This judgment is based on our ability to directly address suicide risk, implement suicide prevention strategies, and develop a safety plan while the patient is in the clinical setting. Please contact our team if there is a concern that risk level has changed.  CSSR Risk Category:C-SSRS RISK CATEGORY: No Risk  Suicide Risk Assessment: Patient has following modifiable risk factors for suicide: medication noncompliance, which we are addressing by inpatient admission. Patient has following non-modifiable or demographic risk factors for suicide: male gender Patient has the following protective factors against suicide: Access to outpatient mental health care  Thank you for this consult request. Recommendations have been communicated to the primary team.  We will recommend inpatient admission at this time.   [name], NP       History of Present Illness  Relevant Aspects of Hospital ED Course:  Admitted on 06/15/2024 for psychiatric evaluation.   Patient Report:  Patient is a 48 year old male admitted under IVC. Per paperwork, patient reportedly made threats to burn down his apartment complex and has been off of his medications for several days. Patient denies making threatening statements and currently denies suicidal ideation, homicidal ideation, or auditory/visual  hallucinations. He reports a history of schizophrenia and states he receives Invega  injection every 4 weeks, though acknowledges he has not been fully adherent recently. Patient shares that he does not talk to his therapist often. He lives alone, smokes one pack of cigarettes daily, and reports a "pretty good" appetite. He states he has been sleeping approximately 8 hours nightly. Patient describes his current mood as calm.  Psych ROS:  Depression: UTA Anxiety:  UTA Mania (lifetime and current): UTA Psychosis: (lifetime and current): UTA  Review of Systems  Constitutional: Negative.   HENT: Negative.    Eyes: Negative.   Respiratory: Negative.    Cardiovascular: Negative.   Gastrointestinal: Negative.   Genitourinary: Negative.   Musculoskeletal: Negative.   Skin: Negative.   Neurological: Negative.      Psychiatric and Social History  Psychiatric History:  Information collected from Patient  Prev Dx/Sx: Schizophrenia Current Psych Provider: unknown Home Meds (current): Invega  Previous Med Trials: unknown Therapy: yes  Prior Psych Hospitalization: unknown  Prior Self Harm: unknown Prior Violence: unknown  Family Psych History: unknown Family Hx suicide: unknown  Social History:  Developmental Hx: unknown Educational Hx: unknown Occupational Hx: unknown Legal Hx: unknown Living Situation: alone Spiritual Hx: unknown Access to weapons/lethal means: no   Substance History Alcohol: denies  Tobacco: yes Illicit drugs: denies Prescription drug abuse: denies Rehab hx: denies  Exam Findings   Vital Signs:  Temp:  [98.6 F (37 C)] 98.6 F (37 C) (09/09 2101) Pulse Rate:  [135] 135 (09/09 2101) BP: (122)/(87) 122/87 (09/09 2101) SpO2:  [97 %] 97 % (09/09 2101) Weight:  [56.2 kg] 56.2 kg (09/09 2102) Blood pressure 122/87, pulse ROLLEN)  135, temperature 98.6 F (37 C), temperature source Oral, height 5' 11 (1.803 m), weight 56.2 kg, SpO2 97%. Body mass index is  17.29 kg/m.  Physical Exam HENT:     Head: Normocephalic.     Nose: Nose normal.  Eyes:     Extraocular Movements: Extraocular movements intact.  Musculoskeletal:        General: Normal range of motion.     Cervical back: Normal range of motion.  Skin:    General: Skin is dry.  Neurological:     Mental Status: He is alert.    Other History   These have been pulled in through the EMR, reviewed, and updated if appropriate.  Family History:  The patient's family history includes Colon cancer (age of onset: 75) in his father.  Medical History: Past Medical History:  Diagnosis Date  . AKI (acute kidney injury) (HCC) 12/05/2017  . Anemia   . Anemia of chronic disease 12/05/2017  . Anxiety   . Chronic tachycardia 01/13/2015  . Elevated liver enzymes 12/05/2017  . Loss of weight 03/21/2017  . Necrotic toes (HCC) 01/16/2018  . Osteomyelitis (HCC) 01/2018   RIGHT FOOT  . Protein-calorie malnutrition, severe 12/05/2017  . Schizophrenia (HCC)   . Sepsis (HCC) 12/04/2017  . Toe osteomyelitis (HCC) 03/05/2018  . Ulcerative colitis [facility])     Surgical History: Past Surgical History:  Procedure Laterality Date  . AMPUTATION Right 01/18/2018   Procedure: AMPUTATION OF TOES;  Surgeon: Harden Jerona GAILS, MD;  [facility];  Service: Orthopedics;  Laterality: Right;  . POUCHOSCOPY N/A 05/06/2019   Procedure: POUCHOSCOPY;  Surgeon: Unk Corinn Skiff, MD;  Location: [facility] ENDOSCOPY;  Service: Gastroenterology;  Laterality: N/A;  . SMALL INTESTINE SURGERY  2016   in [place]  . TOTAL COLECTOMY  2003     Medications:   Current Facility-Administered Medications:  .  alum & mag hydroxide-simeth (MAALOX/MYLANTA) 200-200-20 MG/5ML suspension 30 mL, 30 mL, Oral, Q6H PRN, Viviann Pastor, MD .  ibuprofen  (ADVIL ) tablet 600 mg, 600 mg, Oral, Q8H PRN, Viviann Pastor, MD .  ondansetron  (ZOFRAN ) tablet 4 mg, 4 mg, Oral, Q8H PRN, Viviann Pastor, MD  Current Outpatient Medications:  .   acetaminophen  (TYLENOL ) 500 MG tablet, Take 500 mg by mouth every 6 (six) hours as needed., Disp: , Rfl:  .  albuterol  (VENTOLIN  HFA) 108 (90 Base) MCG/ACT inhaler, Inhale 2 puffs into the lungs every 6 (six) hours as needed for wheezing or shortness of breath., Disp: , Rfl:  .  Cholecalciferol 100 MCG (4000 UT) CAPS, Take by mouth., Disp: , Rfl:  .  ferrous sulfate 324 MG TBEC, Take 324 mg by mouth., Disp: , Rfl:  .  liver oil-zinc  oxide (DESITIN) 40 % ointment, Apply 1 Application topically as needed for irritation., Disp: 56.7 g, Rfl: 0 .  loperamide  (IMODIUM ) 2 MG capsule, Take 1 capsule (2 mg total) by mouth 3 (three) times daily as needed for diarrhea or loose stools., Disp: 30 capsule, Rfl: 0 .  LORazepam  (ATIVAN ) 1 MG tablet, Take 1 mg by mouth at bedtime., Disp: , Rfl:  .  metoprolol  succinate (TOPROL -XL) 100 MG 24 hr tablet, Take 100 mg by mouth daily., Disp: , Rfl:  .  metroNIDAZOLE  (FLAGYL ) 500 MG tablet, Take 1 tablet (500 mg total) by mouth 2 (two) times daily. (Patient not taking: Reported on 05/17/2024), Disp: 14 tablet, Rfl: 0 .  mirtazapine  (REMERON ) 15 MG tablet, Take 1 tablet (15 mg total) by mouth at bedtime., Disp:  30 tablet, Rfl: 0 .  Multiple Vitamin (MULTI-VITAMIN) tablet, Take 1 tablet by mouth daily., Disp: , Rfl:  .  mupirocin ointment (BACTROBAN) 2 %, Apply topically., Disp: , Rfl:  .  Nutritional Supplements (CARNATION BREAKFAST ESSENTIALS PO), Take by mouth., Disp: , Rfl:  .  OLANZapine  (ZYPREXA ) 5 MG tablet, Take 5 mg by mouth., Disp: , Rfl:  .  oxybutynin (DITROPAN XL) 15 MG 24 hr tablet, Take 15 mg by mouth daily., Disp: , Rfl:  .  paliperidone  (INVEGA  SUSTENNA) 234 MG/1.5ML injection, Inject 234 mg into the muscle every 28 (twenty-eight) days., Disp: , Rfl:  .  sertraline (ZOLOFT) 25 MG tablet, Take 25 mg by mouth daily., Disp: , Rfl:  .  SYMBICORT 160-4.5 MCG/ACT inhaler, Inhale 2 puffs into the lungs 2 (two) times daily., Disp: , Rfl:  .  tolnaftate (TINACTIN) 1  % cream, Apply 1 Application topically 2 (two) times daily., Disp: , Rfl:   Allergies: Allergies  Allergen Reactions  . Tegretol [Carbamazepine] Diarrhea    Rocklyn Mayberry, NP

## 2024-06-16 NOTE — BH Assessment (Addendum)
 Comprehensive Clinical Assessment (CCA) Screening, Triage and Referral Note  06/16/2024 [name] [id number] Recommendations for Services/Supports/Treatments: Consulted with NP Jon HERO., who recommended pt. for overnight observation and reassessment in the AM.   Christian Das "Nena" is a 48 year old, English speaking, Black male. Pt presented to [facility] under IVC. Per triage note: Pt under IVC with [facility], per papers pt was threatening to burn down apartment complex and has been off of his medications for the past few days. Pt appears calm and cooperative in triage.  On assessment Patient did not appear visibly distressed. Pt had a rigid posture, with slowed psychomotor activity. Pt presented with sparse speech. Pt was vague and responded with either an abrupt yes or no to assessment questions. Affect was congruent with apathetic mood. Patient was guarded and reticent throughout the session. Pt reported that he lives alone in an apartment. Pt denied having made any threats to burn his apartment building. Pt did report that he gets the monthly Invega  shot. Pt denied having any disturbances at this time. The patient has not had any behavioral or aggressive episodes while in the psych ED. Pt has not required any emergency interventions. Pt denied having SI/HI/AVH.  Chief Complaint:  Chief Complaint  Patient presents with   Psychiatric Evaluation   Visit Diagnosis: Schizoaffective disorder, bipolar type  Patient Reported Information How did you hear about us ? No data recorded What Is the Reason for Your Visit/Call Today? No data recorded How Long Has This Been Causing You Problems? No data recorded What Do You Feel Would Help You the Most Today? No data recorded  Have You Recently Had Any Thoughts About Hurting Yourself? No data recorded Are You Planning to Commit Suicide/Harm Yourself At This time? No data recorded  Have you Recently Had Thoughts About Hurting Someone Sherral? No data  recorded Are You Planning to Harm Someone at This Time? No data recorded Explanation: No data recorded  Have You Used Any Alcohol or Drugs in the Past 24 Hours? No data recorded How Long Ago Did You Use Drugs or Alcohol? No data recorded What Did You Use and How Much? No data recorded  Do You Currently Have a Therapist/Psychiatrist? No data recorded Name of Therapist/Psychiatrist: No data recorded  Have You Been Recently Discharged From Any Office Practice or Programs? No data recorded Explanation of Discharge From Practice/Program: No data recorded   CCA Screening Triage Referral Assessment Type of Contact: No data recorded Telemedicine Service Delivery:   Is this Initial or Reassessment?   Date Telepsych consult ordered in [facility]:    Time Telepsych consult ordered in [facility]:    Location of Assessment: No data recorded Provider Location: No data recorded   Collateral Involvement: No data recorded  Does Patient Have a Court Appointed Legal Guardian? No data recorded Name and Contact of Legal Guardian: No data recorded If Minor and Not Living with Parent(s), Who has Custody? No data recorded Is CPS involved or ever been involved? No data recorded Is APS involved or ever been involved? No data recorded  Patient Determined To Be At Risk for Harm To Self or Others Based on Review of Patient Reported Information or Presenting Complaint? No data recorded Method: No data recorded Availability of Means: No data recorded Intent: No data recorded Notification Required: No data recorded Additional Information for Danger to Others Potential: No data recorded Additional Comments for Danger to Others Potential: No data recorded Are There Guns or Other Weapons in Your Home? No data recorded Types  of Guns/Weapons: No data recorded Are These Weapons Safely Secured?                            No data recorded Who Could Verify You Are Able To Have These Secured: No data recorded Do You Have any  Outstanding Charges, Pending Court Dates, Parole/Probation? No data recorded Contacted To Inform of Risk of Harm To Self or Others: No data recorded  Does Patient Present under Involuntary Commitment? No data recorded   [place] of Residence: No data recorded  Patient Currently Receiving the Following Services: No data recorded  Determination of Need: No data recorded  Options For Referral: No data recorded  Disposition Recommendation per psychiatric provider: Reassessment  Philomena Buttermore R Venia Riveron, LCAS

## 2024-06-16 NOTE — Progress Notes (Signed)
 Per TTS, patient has been referred to the following facilities:   Service Provider Phone  [facility]  [phone]  [facility]  [phone]  [facility]  [phone]  [facility] [phone]  [facility]  [phone]  [facility]  [phone]  [facility]  [phone]  [facility]  [phone]  [facility]  [phone]  [facility]  [phone]  [facility]  [phone]  [place] [facility]  [phone]  [facility]  [phone]  [facility]  [phone]  [facility]  [phone]  [facility]  [phone]  [facility] [address]  [address] [place] [phone]

## 2024-06-16 NOTE — ED Notes (Signed)
 Ivc / psych consult pending /moved to [facility] 3

## 2024-06-16 NOTE — ED Notes (Signed)
 Assumed care of patient, pt resting with eyes shut, respirations even and unlabored, no distress noted

## 2024-06-16 NOTE — ED Notes (Signed)
[place]  [employer]  called  for  transport  to  [facility]

## 2024-06-16 NOTE — Progress Notes (Signed)
 Patient has been accepted to [facility] for 06/16/24. Patient was assigned to [facility]. Accepting physician is Dr. Millie Manners. Call report to [phone] Option 2 (Please leave voicemail). Representative was [employer]   ER Staff is aware of it: Olam, ER Secretary Dr. Levander, ER MD Sonny PEAK, Patient's Nurse   Address:  [address]                  [place], [place] [address]

## 2024-06-16 NOTE — ED Notes (Signed)
 EMTALA reviewed by this RN.

## 2024-06-16 NOTE — ED Notes (Addendum)
 Pt. to [facility] from [facility] ambulatory without difficulty, to room 3 . Report from [place], [place]. Pt. Is alert and oriented, warm and dry in no distress. Pt. Denies SI, HI, and AVH. Vague and guarded upon approach. States he did not want to talk about the circumstances that brought him to the hospital. Pt. Calm and cooperative. Pt. Made aware of security cameras and Q15 minute rounds. Pt. Encouraged to let Nursing staff know of any concerns or needs.

## 2024-06-16 NOTE — ED Notes (Signed)
Pt lunch provided.

## 2024-06-16 NOTE — ED Provider Notes (Signed)
 Emergency Medicine Observation Re-evaluation Note  Christian Lamb is a 48 y.o. male, seen on rounds today.  Pt initially presented to the ED for complaints of Psychiatric Evaluation  Currently, the patient is resting in bed. No reported issues from nursing team.   Physical Exam  BP 122/87 (BP Location: Left Arm)   Pulse (!) 135   Temp 98.6 F (37 C) (Oral)   Ht 5' 11 (1.803 m)   Wt 56.2 kg   SpO2 97%   BMI 17.29 kg/m  Physical Exam General: Resting in bed  ED Course / MDM   Labs on presentation without critical derangements  Plan  Current plan is for dispo per psychiatry, current recommendation for inpatient hospitalization.  Under IVC.    Christian Slate, MD 06/16/24 [phone]

## 2024-06-16 NOTE — ED Notes (Signed)
 This RN left message for [name] to call back for report

## 2024-06-16 NOTE — ED Notes (Signed)
 Pt snack provided

## 2024-08-14 ENCOUNTER — Encounter: Payer: Self-pay | Admitting: Emergency Medicine

## 2024-08-14 ENCOUNTER — Emergency Department
Admission: EM | Admit: 2024-08-14 | Discharge: 2024-08-14 | Disposition: A | Discharge status: Home / Self Care | Payer: MEDICAID | Attending: Emergency Medicine | Admitting: Emergency Medicine

## 2024-08-14 DIAGNOSIS — K529 Noninfective gastroenteritis and colitis, unspecified: Secondary | ICD-10-CM | POA: Insufficient documentation

## 2024-08-14 DIAGNOSIS — F209 Schizophrenia, unspecified: Secondary | ICD-10-CM | POA: Diagnosis present

## 2024-08-14 LAB — COMPREHENSIVE METABOLIC PANEL WITH GFR
ALT: 10 U/L (ref 0–44)
AST: 13 U/L — ABNORMAL LOW (ref 15–41)
Albumin: 2.9 g/dL — ABNORMAL LOW (ref 3.5–5.0)
Alkaline Phosphatase: 70 U/L (ref 38–126)
Anion gap: 8 (ref 5–15)
BUN: 6 mg/dL (ref 6–20)
CO2: 27 mmol/L (ref 22–32)
Calcium: 8.5 mg/dL — ABNORMAL LOW (ref 8.9–10.3)
Chloride: 107 mmol/L (ref 98–111)
Creatinine, Ser: 0.81 mg/dL (ref 0.61–1.24)
GFR, Estimated: >60 mL/min (ref >60)
Glucose, Bld: 92 mg/dL (ref 70–99)
Potassium: 3.3 mmol/L — ABNORMAL LOW (ref 3.5–5.1)
Sodium: 142 mmol/L (ref 135–145)
Total Bilirubin: 0.7 mg/dL (ref 0.0–1.2)
Total Protein: 8 g/dL (ref 6.5–8.1)

## 2024-08-14 LAB — CBC
HCT: 36.7 % — ABNORMAL LOW (ref 39.0–52.0)
Hemoglobin: 11.5 g/dL — ABNORMAL LOW (ref 13.0–17.0)
MCH: 28.8 pg (ref 26.0–34.0)
MCHC: 31.3 g/dL (ref 30.0–36.0)
MCV: 92 fL (ref 80.0–100.0)
Platelets: 252 K/uL (ref 150–400)
RBC: 3.99 MIL/uL — ABNORMAL LOW (ref 4.22–5.81)
RDW: 12.6 % (ref 11.5–15.5)
WBC: 11 K/uL — ABNORMAL HIGH (ref 4.0–10.5)
nRBC: 0 % (ref 0.0–0.2)

## 2024-08-14 NOTE — ED Notes (Signed)
 Pt given DC instructions. Pt given opportunity for questions. Pt verbalized understanding of follow up care. Pt ambulatory from ED without difficulty.

## 2024-08-14 NOTE — ED Triage Notes (Signed)
 Per EMS pt coming from home c/o loose stools over past few months with today started noticing blood. C/o generalized abdominal pain now. Denies any blood thinners.

## 2024-08-14 NOTE — ED Provider Notes (Signed)
 [facility] Provider Note    Event Date/Time   First MD Initiated Contact with Patient 08/14/24 1533     (approximate)   History   Chief Complaint: Abdominal Pain and Rectal Bleeding   HPI  Christian Lamb is a 48 y.o. male with a history of schizophrenia, ulcerative colitis, anxiety who comes ED complaining of generalized abdominal discomfort and diarrhea, worse today, noticing some small amount of blood in the stool today.  He reports that some of the food that he has been eating is likely spoiled as it has been in his fridge for a long time and he avoids using his stove due to safety concerns.  No fever, no vomiting.  No dizziness        Past Medical History:  Diagnosis Date   AKI (acute kidney injury) 12/05/2017   Anemia    Anemia of chronic disease 12/05/2017   Anxiety    Chronic tachycardia 01/13/2015   Elevated liver enzymes 12/05/2017   Loss of weight 03/21/2017   Necrotic toes (HCC) 01/16/2018   Osteomyelitis (HCC) 01/2018   RIGHT FOOT   Protein-calorie malnutrition, severe 12/05/2017   Schizophrenia (HCC)    Sepsis (HCC) 12/04/2017   Toe osteomyelitis (HCC) 03/05/2018   Ulcerative colitis [facility])     Current Outpatient Rx   Order #: [id number] Class: Historical Med   Order #: [id number] Class: Historical Med   Order #: [id number] Class: Historical Med   Order #: [id number] Class: Historical Med   Order #: [id number] Class: Normal   Order #: [id number] Class: Normal   Order #: [id number] Class: Historical Med   Order #: [id number] Class: Historical Med   Order #: [id number] Class: Normal   Order #: [id number] Class: Print   Order #: [id number] Class: Historical Med   Order #: [id number] Class: Historical Med   Order #: [id number] Class: Historical Med   Order #: [id number] Class: Historical Med   Order #: [id number] Class: Historical Med   Order #: [id number] Class: Historical Med   Order #: [id number] Class: Historical Med   Order #: [id number] Class: Historical Med    Order #: [id number] Class: Historical Med    Past Surgical History:  Procedure Laterality Date   AMPUTATION Right 01/18/2018   Procedure: AMPUTATION OF TOES;  Surgeon: Harden Jerona GAILS, MD;  Location: [facility] OR;  Service: Orthopedics;  Laterality: Right;   POUCHOSCOPY N/A 05/06/2019   Procedure: POUCHOSCOPY;  Surgeon: Unk Corinn Skiff, MD;  Location: [facility] ENDOSCOPY;  Service: Gastroenterology;  Laterality: N/A;   SMALL INTESTINE SURGERY  2016   in [place]   TOTAL COLECTOMY  2003    Physical Exam   Triage Vital Signs: ED Triage Vitals  Encounter Vitals Group     BP 08/14/24 1505 122/88     Girls Systolic BP Percentile --      Girls Diastolic BP Percentile --      Boys Systolic BP Percentile --      Boys Diastolic BP Percentile --      Pulse Rate 08/14/24 1505 99     Resp 08/14/24 1505 18     Temp 08/14/24 1505 97.9 F (36.6 C)     Temp Source 08/14/24 1505 Oral     SpO2 08/14/24 1504 100 %     Weight 08/14/24 1506 132 lb (59.9 kg)     Height 08/14/24 1506 5' 11 (1.803 m)     Head Circumference --      Peak Flow --      Pain Score 08/14/24 1505 8     Pain Loc --  Pain Education --      Exclude from Growth Chart --     Most recent vital signs: Vitals:   08/14/24 1505 08/14/24 1536  BP: 122/88   Pulse: 99   Resp: 18   Temp: 97.9 F (36.6 C)   SpO2:  100%    General: Awake, no distress.  CV:  Good peripheral perfusion.  Regular rate rhythm Resp:  Normal effort.  Abd:  No distention.  Soft nontender Other:  Nostril mucosa.  Nontoxic   ED Results / Procedures / Treatments   Labs (all labs ordered are listed, but only abnormal results are displayed) Labs Reviewed  COMPREHENSIVE METABOLIC PANEL WITH GFR - Abnormal; Notable for the following components:      Result Value   Potassium 3.3 (*)    Calcium 8.5 (*)    Albumin 2.9 (*)    AST 13 (*)    All other components within normal limits  CBC - Abnormal; Notable for the following components:   WBC 11.0 (*)     RBC 3.99 (*)    Hemoglobin 11.5 (*)    HCT 36.7 (*)    All other components within normal limits  POC OCCULT BLOOD, ED  TYPE AND SCREEN     EKG    RADIOLOGY    PROCEDURES:  Procedures   MEDICATIONS ORDERED IN ED: Medications - No data to display   IMPRESSION / MDM / ASSESSMENT AND PLAN / ED COURSE  I reviewed the triage vital signs and the nursing notes.  DDx: Enteritis, AKI, electrolyte derangement, anemia  Patient's presentation is most consistent with acute presentation with potential threat to life or bodily function.     Clinical Course as of 08/14/24 1624  Sat Aug 14, 2024  1545 Patient with schizophrenia comes to ED complaining of abdominal pain and diarrhea after eating spoiled food from his fridge.  He is nontoxic with reassuring exam, hemoglobin is stable from his chronic baseline of 11.5.  Denies vomiting.  Will check labs [PS]  1624 Labs unremarkable.  Tolerating p.o. in the ED.  Had a loose bowel movement, not melanotic, not bloody.  Stable for discharge. [PS]    Clinical Course User Index [PS] Viviann Pastor, MD     FINAL CLINICAL IMPRESSION(S) / ED DIAGNOSES   Final diagnoses:  Schizophrenia, unspecified type (HCC)  Chronic diarrhea     Rx / DC Orders   ED Discharge Orders     None        Note:  This document was prepared using Dragon voice recognition software and may include unintentional dictation errors.   Viviann Pastor, MD 08/14/24 [phone]

## 2024-08-14 NOTE — ED Notes (Signed)
 Pt able to tolerate PO challenge. Pt denies nausea and no vomiting noted at this time.

## 2024-08-14 NOTE — ED Notes (Signed)
 Pt given bag lunch and drink at this time per provider order.

## 2024-08-25 LAB — TYPE AND SCREEN
ABO/RH(D): O POS
Antibody Screen: NEGATIVE

## 2024-08-30 NOTE — Progress Notes (Signed)
 History of Present Illness:   Christian Lamb is a 48 y.o. male here for   Verbally consented to the use of AI for note-taking.   Chief Complaint  Patient presents with  . ER followup      History of Present Illness Christian Lamb is a 48 year old male with ulcerative colitis who presents for emergency room follow-up.  He visited the emergency room on August 14, 2024, due to generalized abdominal discomfort and diarrhea, which worsened on that day compared to the previous day. He noticed a small amount of blood in his stool at that time. He attributed his symptoms to consuming spoiled food. No fever, vomiting, or dizziness. Emergency room labs showed normal results except for a potassium level of 3.3, calcium level of 8.5, and slightly elevated white blood cells at 11.  Currently, he has no diarrhea and is taking his medications as prescribed. He uses loperamide  as needed for loose stools, particularly after consuming sodas or certain foods, and requested a refill for loperamide .  He has a history of ulcerative colitis and has not experienced recent symptoms; he believes he may be over it. He mentioned a past diagnosis of Crohn's disease in 2018 and 2020 but has been symptom-free recently.  He takes a PRN medication to help keep him calm, which he could not recall the name of, but mentioned it might be hydroxyzine  or Ativan . He sees a psychiatrist, Bernarda Pepper, for his schizophrenia.  No fever, vomiting, dizziness, or current abdominal pain. No blood in stools or fever recently.   Past Medical History:   Past Medical History:  Diagnosis Date  . Adjustment disorder 05/07/2019  . Anemia of chronic disease 12/05/2017  . Anxiety 01/07/2022  . Chronic obstructive pulmonary disease, unspecified (CMS/HHS-HCC) 01/07/2022  . Disease characterized by destruction of skeletal muscle 12/05/2017  . Essential hypertension 01/07/2022  . History of ulcerative colitis 08/16/2019  .  Insomnia 01/07/2022  . Mood disorder () 05/08/2019  . Paranoid (CMS/HHS-HCC) 01/07/2022  . Schizoaffective disorder, bipolar type (CMS/HHS-HCC) 02/11/2016  . Schizophrenia (CMS/HHS-HCC) 01/07/2022  . Seasonal allergic rhinitis 01/07/2022  . 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 Tachycardia, unspecified 01/13/2015    Past Surgical History:  No past surgical history on file.  Allergies:   Allergies  Allergen Reactions  . Pollen Extracts Itching  . Barium Sulfate  Unknown  . Carbamazepine Diarrhea    Current Medications:   Prior to Admission medications  Medication Sig Taking? Last Dose  acetaminophen  (TYLENOL ) 325 MG tablet Take by mouth every 6 (six) hours as needed Yes Taking  aluminum-magnesium  hydroxide 200-200 mg/5 mL suspension Take by mouth every 6 (six) hours as needed Yes Taking  ascorbic acid, vitamin C, (VITAMIN C) 500 MG tablet 1 tablet (500 mg total) Yes Taking  atenoloL  (TENORMIN ) 25 MG tablet 1/2 tablet Yes Taking  benztropine  (COGENTIN ) 1 MG tablet 1/2  tablet at bedtime Yes Taking  cholecalciferol, vitamin D3, 100 mcg (4,000 unit) Cap Take by mouth Yes Taking  cloZAPine 150 mg TbDL Take 150 mg by mouth at bedtime Yes Taking  cyanocobalamin (VITAMIN B12) 100 MCG tablet Take by mouth Yes Taking  diphenoxylate -atropine  (LOMOTIL ) 2.5-0.025 mg tablet 1 tablet as needed Yes Taking  divalproex (DEPAKOTE) 500 MG DR tablet  Yes Taking  hydrOXYzine  (ATARAX ) 50 MG tablet Take by mouth every 6 (six) hours as needed Yes Taking  INVEGA  SUSTENNA 234 mg/1.5 mL IM syringe  Yes Taking  ipratropium-albuteroL  (DUO-NEB) nebulizer solution 3 ml Yes Taking  loperamide  (IMODIUM ) 2 mg capsule 1 capsule as needed Yes Taking  loratadine (CLARITIN REDITABS) 5 mg TbDL 1 tablet on the tongue and allow to dissolve Yes Taking  LORazepam  (ATIVAN ) 1 MG tablet  Yes Taking  LORazepam  (ATIVAN ) 2 MG tablet Take by mouth every 6 (six) hours as needed Yes Taking  magnesium  hydroxide (MILK OF  MAGNESIA) 400 mg/5 mL suspension Take by mouth at bedtime as needed Yes Taking  melatonin 3 mg tablet Take by mouth Yes Taking  mesalamine  (ROWASA ) 4 gram/60 mL enema as directed Yes Taking  metoprolol  succinate (TOPROL -XL) 100 MG XL tablet  Yes Taking  metoprolol  succinate (TOPROL -XL) 25 MG XL tablet Take by mouth Yes Taking  mirtazapine  (REMERON ) 15 MG tablet Take by mouth Yes Taking  multivit with min-folic acid  120 mcg Chew Take by mouth Yes Taking  multivitamin tablet Take 1 tablet by mouth once daily Yes Taking  mupirocin (BACTROBAN) 2 % ointment Apply topically once daily Yes Taking  OLANZapine  (ZYPREXA ) 5 MG tablet  Yes Taking  oxybutynin (DITROPAN XL) 15 MG XL tablet Take by mouth Yes Taking  risperiDONE (RISPERDAL) 1 MG tablet Take 1 tablet (1 mg total) by mouth at bedtime Yes Taking  sertraline (ZOLOFT) 25 MG tablet Take by mouth Yes Taking  sodium bicarbonate  650 MG tablet as directed Yes Taking  SYMBICORT 160-4.5 mcg/actuation inhaler  Yes Taking  tolnaftate (TINACTIN) 1 % powder Apply 1 Application topically at bedtime Yes Taking  zinc  sulfate (ZINCATE) 50 mg zinc  (220 mg) capsule 1 capsule (220 mg total) Yes Taking    Family History:  No family history on file.  Social History:   Social History   Socioeconomic History  . Marital status: Single  Tobacco Use  . Smoking status: Every Day    Current packs/day: 1.00    Types: Cigarettes    Passive exposure: Current  . Smokeless tobacco: Never  Vaping Use  . Vaping status: Never Used  Substance and Sexual Activity  . Alcohol use: Not Currently  . Drug use: Never  . Sexual activity: Defer   Social Drivers of Health   Financial Resource Strain: Low Risk  (05/04/2024)   Overall Financial Resource Strain (CARDIA)   . Difficulty of Paying Living Expenses: Not hard at all  Food Insecurity: No Food Insecurity (05/04/2024)   Hunger Vital Sign   . Worried About Programme Researcher, Broadcasting/film/video in the Last Year: Never true   . Ran  Out of Food in the Last Year: Never true  Transportation Needs: No Transportation Needs (05/04/2024)   PRAPARE - Transportation   . Lack of Transportation (Medical): No   . Lack of Transportation (Non-Medical): No  Physical Activity: Insufficiently Active (03/05/2018)   [facility]   Exercise Vital Sign   . Days of Exercise per Week: 7 days   . Minutes of Exercise per Session: 20 min  Stress: No Stress Concern Present (03/05/2018)   Received from [facility] of Occupational Health - Occupational Stress Questionnaire   . Feeling of Stress : Only a little  Social Connections: Moderately Isolated (03/05/2018)   Received from [facility]   Social Connection and Isolation Panel   . Frequency of Communication with Friends and Family: Once a week   . Frequency of Social Gatherings with Friends and Family: Twice a week   . Attends Religious Services: More than 4 times per year   . Active Member of Clubs or Organizations:  No   . Attends Club or Organization Meetings: Never   . Marital Status: Never married  Housing Stability: Low Risk  (05/04/2024)   Housing Stability Vital Sign   . Unable to Pay for Housing in the Last Year: No   . Number of Times Moved in the Last Year: 1   . Homeless in the Last Year: No    Review of Systems:   A 10 point review of systems is negative, except for the pertinent positives and negatives detailed in the HPI.  Vitals:   Vitals:   08/30/24 1534  BP: 112/70  Pulse: 73  SpO2: 97%  Weight: 65.3 kg (144 lb)  Height: 180.3 cm (5' 11)     Body mass index is 20.08 kg/m.  Physical Exam:   Physical Exam Vitals and nursing note reviewed.  Constitutional:      General: He is not in acute distress.    Appearance: Normal appearance. He is not ill-appearing, toxic-appearing or diaphoretic.  HENT:     Head: Normocephalic and atraumatic.     Right Ear: External ear normal.     Left Ear: External ear normal.   Eyes:     Conjunctiva/sclera: Conjunctivae normal.  Cardiovascular:     Rate and Rhythm: Normal rate and regular rhythm.     Pulses: Normal pulses.     Heart sounds: Normal heart sounds.  Pulmonary:     Effort: Pulmonary effort is normal.     Breath sounds: Normal breath sounds.  Abdominal:     General: Abdomen is flat. Bowel sounds are normal.     Palpations: Abdomen is soft.     Tenderness: There is no abdominal tenderness. There is no guarding.  Neurological:     Mental Status: He is alert.     Assessment and Plan:  No results found for this visit on 08/30/24.  I reviewed the hospital discharge summary, medication and allergies reconciliation, and any relevant labs/imaging. Patient seen in clinic today for post-discharge follow-up as part of transition of care management.  Assessment & Plan Ulcerative colitis in remission Ulcerative colitis in remission with adherence to medication and dietary modifications. - Continue to monitor for abdominal pain and blood in stools  Gastroenteritis Emergency room follow-up for gastroenteritis.  Patient had leukocytosis, hypokalemia, hypocalcemia - Recheck CBC and CMP today - Called in loperamide  for chronic diarrhea - Patient has improved and is not experiencing any symptoms that brought him to the emergency room - Advised patient to follow-up if symptoms worsen  Disposition: Follow-up as needed  There are no Patient Instructions on file for this visit.   This note has been created using automated tools and reviewed for accuracy by provider.  Patient received an After Visit Summary    Attestation Statement:   I personally performed the service, non-incident to. (WP)   MASON MCCLELLAND MINOR, PA

## 2024-09-08 ENCOUNTER — Other Ambulatory Visit: Payer: Self-pay

## 2024-09-08 ENCOUNTER — Emergency Department: Payer: MEDICAID

## 2024-09-08 ENCOUNTER — Emergency Department
Admission: EM | Admit: 2024-09-08 | Discharge: 2024-09-08 | Disposition: A | Discharge status: Home / Self Care | Payer: MEDICAID | Attending: Emergency Medicine | Admitting: Emergency Medicine

## 2024-09-08 DIAGNOSIS — J45909 Unspecified asthma, uncomplicated: Secondary | ICD-10-CM | POA: Insufficient documentation

## 2024-09-08 DIAGNOSIS — K529 Noninfective gastroenteritis and colitis, unspecified: Secondary | ICD-10-CM | POA: Insufficient documentation

## 2024-09-08 LAB — COMPREHENSIVE METABOLIC PANEL WITH GFR
ALT: 11 U/L (ref 0–44)
AST: 15 U/L (ref 15–41)
Albumin: 3.3 g/dL — ABNORMAL LOW (ref 3.5–5.0)
Alkaline Phosphatase: 90 U/L (ref 38–126)
Anion gap: 6 (ref 5–15)
BUN: 6 mg/dL (ref 6–20)
CO2: 28 mmol/L (ref 22–32)
Calcium: 8.4 mg/dL — ABNORMAL LOW (ref 8.9–10.3)
Chloride: 107 mmol/L (ref 98–111)
Creatinine, Ser: 0.72 mg/dL (ref 0.61–1.24)
GFR, Estimated: >60 mL/min
Glucose, Bld: 92 mg/dL (ref 70–99)
Potassium: 3.6 mmol/L (ref 3.5–5.1)
Sodium: 142 mmol/L (ref 135–145)
Total Bilirubin: 0.4 mg/dL (ref 0.0–1.2)
Total Protein: 7.9 g/dL (ref 6.5–8.1)

## 2024-09-08 LAB — GASTROINTESTINAL PANEL BY PCR, STOOL (REPLACES STOOL CULTURE)

## 2024-09-08 LAB — C DIFFICILE QUICK SCREEN W PCR REFLEX
C Diff antigen: NEGATIVE
C Diff interpretation: NOT DETECTED
C Diff toxin: NEGATIVE

## 2024-09-08 LAB — CBC WITH DIFFERENTIAL/PLATELET
Abs Immature Granulocytes: 0.04 K/uL (ref 0.00–0.07)
Basophils Absolute: 0.1 K/uL (ref 0.0–0.1)
Basophils Relative: 1 %
Eosinophils Absolute: 0.2 K/uL (ref 0.0–0.5)
Eosinophils Relative: 2 %
HCT: 37.9 % — ABNORMAL LOW (ref 39.0–52.0)
Hemoglobin: 11.5 g/dL — ABNORMAL LOW (ref 13.0–17.0)
Immature Granulocytes: 0 %
Lymphocytes Relative: 13 %
Lymphs Abs: 1.4 K/uL (ref 0.7–4.0)
MCH: 28.5 pg (ref 26.0–34.0)
MCHC: 30.3 g/dL (ref 30.0–36.0)
MCV: 93.8 fL (ref 80.0–100.0)
Monocytes Absolute: 1 K/uL (ref 0.1–1.0)
Monocytes Relative: 9 %
Neutro Abs: 7.8 K/uL — ABNORMAL HIGH (ref 1.7–7.7)
Neutrophils Relative %: 75 %
Platelets: 240 K/uL (ref 150–400)
RBC: 4.04 MIL/uL — ABNORMAL LOW (ref 4.22–5.81)
RDW: 13 % (ref 11.5–15.5)
WBC: 10.4 K/uL (ref 4.0–10.5)
nRBC: 0 % (ref 0.0–0.2)

## 2024-09-08 LAB — TROPONIN T, HIGH SENSITIVITY: Troponin T High Sensitivity: <15 ng/L (ref 0–19)

## 2024-09-08 MED ORDER — SODIUM CHLORIDE 0.9 % IV BOLUS
1000.0000 mL | Freq: Once | INTRAVENOUS | Status: AC
  Administered 2024-09-08: 1000 mL via INTRAVENOUS

## 2024-09-08 MED ORDER — LOPERAMIDE HCL 2 MG PO CAPS: 2.0000 mg | ORAL_CAPSULE | Freq: Three times a day (TID) | ORAL | 0 refills | Status: AC | PRN

## 2024-09-08 MED ORDER — LOPERAMIDE HCL 2 MG PO CAPS: 2.0000 mg | ORAL_CAPSULE | Freq: Three times a day (TID) | ORAL | 0 refills | Status: DC | PRN

## 2024-09-08 MED ORDER — ALBUTEROL SULFATE HFA 108 (90 BASE) MCG/ACT IN AERS: 2.0000 | INHALATION_SPRAY | Freq: Four times a day (QID) | RESPIRATORY_TRACT | 0 refills | Status: DC | PRN

## 2024-09-08 MED ORDER — METRONIDAZOLE 500 MG PO TABS: 500.0000 mg | ORAL_TABLET | Freq: Two times a day (BID) | ORAL | 0 refills | Status: DC

## 2024-09-08 MED ORDER — METRONIDAZOLE 500 MG PO TABS: 500.0000 mg | ORAL_TABLET | Freq: Two times a day (BID) | ORAL | 0 refills | Status: AC

## 2024-09-08 NOTE — ED Triage Notes (Signed)
 BIB [facility] with CC of loose stools for the past few months. Reports pain with bowel movements but denies ain at this time. Seen for same last month.

## 2024-09-08 NOTE — Discharge Instructions (Addendum)
 I have given you refills of your Imodium  and your albuterol  inhaler. I have placed you back on Flagyl  do not drink alcohol with this but this could help if there is any infection of your pouch and I am giving you the number for GI to call to make a follow-up appointment to discuss your diarrhea and help get you back on your medications for your ulcerative colitis.  Return to the ER for worsening bleeding, shortness of breath, abdominal pain or any other concerns.

## 2024-09-08 NOTE — ED Provider Notes (Signed)
 [facility] Provider Note    Event Date/Time   First MD Initiated Contact with Patient 09/08/24 [phone]     (approximate)   History   No chief complaint on file.   HPI  Christian Lamb is a 48 y.o. male with schizophrenia, ulcerative colitis who comes in with diarrhea.  Patient has a history of chronic diarrhea.  He has had this ongoing for many years since having resection of his large intestine and part of his small intestine.  He denies any fevers.  Patient has been seen at [facility] in the past.  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  I reviewed a note from 7/29 where patient came in and had normal vital signs and reassuring blood work, patient was treated with Flagyl  for concern for pouchitis.  Patient reports that he came in today due to having a little bit of blood with wiping.  He reports that was 1 episode and that he just went to the bathroom and he had no additional blood noted.  He did not want me to do a rectal exam given he his last bowel movement had no blood.  He reports continued issues with diarrhea and he stopped and compliant with prior medications for his ulcerative colitis.  He reports running out of the loperamide .  That he states that he does not have the number to follow-up with [facility].  Patient also reports a history of asthma and has an inhaler that he also ran out of at home so reports a little bit of shortness of breath associated to that but denies any chest pain, leg swelling or any other concerns.  He reports a little bit of shortness of breath before but denies any currently.  This was over a day ago.  Patient does report a history of schizophrenia but he denies any SI HI auditory visual hallucinations.  He reports doing well from a mental standpoint.  I reviewed the note from 08/14/2023 where patient is on loperamide  1-2 tabs prior to each meal as  needed  Physical Exam   Triage Vital Signs: ED Triage Vitals [09/08/24 0628]  Encounter Vitals Group     BP 120/83     Girls Systolic BP Percentile      Girls Diastolic BP Percentile      Boys Systolic BP Percentile      Boys Diastolic BP Percentile      Pulse Rate (!) 101     Resp 16     Temp 98.5 F (36.9 C)     Temp Source Oral     SpO2 98 %     Weight 132 lb 4.4 oz (60 kg)     Height 5' 11 (1.803 m)     Head Circumference      Peak Flow      Pain Score 0     Pain Loc      Pain Education      Exclude from Growth Chart     Most recent vital signs: Vitals:   09/08/24 0628  BP: 120/83  Pulse: (!) 101  Resp: 16  Temp: 98.5 F (36.9 C)  SpO2: 98%     General: Awake, no distress.  CV:  Good peripheral perfusion.  Tachycardic Resp:  Normal effort.  Clear lungs Abd:  No distention.  Soft and nontender Other:  Patient declined rectal exam. No auditory visual hallucinations no  SI no HI No swelling legs.  No calf tenderness.  Prior toe amputations noted on the right foot.  No redness or warmth noted to feet good distal pulses.  ED Results / Procedures / Treatments   Labs (all labs ordered are listed, but only abnormal results are displayed) Labs Reviewed  CBC WITH DIFFERENTIAL/PLATELET - Abnormal; Notable for the following components:      Result Value   RBC 4.04 (*)    Hemoglobin 11.5 (*)    HCT 37.9 (*)    Neutro Abs 7.8 (*)    All other components within normal limits  COMPREHENSIVE METABOLIC PANEL WITH GFR - Abnormal; Notable for the following components:   Calcium 8.4 (*)    Albumin 3.3 (*)    All other components within normal limits  GASTROINTESTINAL PANEL BY PCR, STOOL (REPLACES STOOL CULTURE)  TROPONIN T, HIGH SENSITIVITY     EKG  My interpretation of EKG:  EKG is normal sinus rhythm 93 with mild ST elevation but no T wave inversions, normal intervals, maybe a little bit of diffuse ST elevation that looks more consistent with early repol, I  did review her prior EKG from 05/04/2024 and patient has similar EKG findings with diffuse ST elevation concerning for early repull.  RADIOLOGY I have reviewed the xray personally and interpreted no evidence of any pneumonia   PROCEDURES:  Critical Care performed: No  Procedures   MEDICATIONS ORDERED IN ED: Medications  sodium chloride  0.9 % bolus 1,000 mL (1,000 mLs Intravenous New Bag/Given 09/08/24 0739)     IMPRESSION / MDM / ASSESSMENT AND PLAN / ED COURSE  I reviewed the triage vital signs and the nursing notes.   Patient's presentation is most consistent with acute presentation with potential threat to life or bodily function.   Patient comes in with episode of bright red blood per rectum.  Patient has chronic issues with diarrhea.  On review of records it appears that patient has a history of pouchitis previously treated with Cipro , Flagyl  and sometimes just Flagyl  by itself.  Given the risk for Cipro  will to start on a short course of Flagyl  as this is what he was placed on in July and he seems to tolerate this well.  Understands not to drink alcohol with it.  Blood work was ordered to make sure no evidence of anemia and this was normal and no evidence of electrolyte abnormalities and this was normal.  Patient was given a liter of fluid.  He denied any abdominal pain and abdomen was soft and nontender so I low suspicion for infectious colitis, bowel obstruction or other acute pathology.  Do not feel like CT imaging is warranted at this time.    For patient's shortness of breath he really denies any currently he is got no wheezing on examination but reports that just because he ran out of his inhaler therefore we will prescribe him an additional inhaler.  No evidence of cardiac issues no evidence of DVT, PE based upon physical exam and evaluation.  And no evidence of pneumonia on chest x-ray.  From the psychiatric standpoint patient reports feeling stable and no concerns at this  time  9:17 AM patient able to give a stool sample but does not want to wait for results he understands he can follow-up in MyChart he also is going to give us  his phone number that we can call with the results if positive.  His stool was diarrhea-like in nature but no blood and no  melena noted.  Patient's abdomen remains soft and nontender so can be discharged home with close follow-up with GI.  Patient expressed understanding and felt comfortable with this plan.    FINAL CLINICAL IMPRESSION(S) / ED DIAGNOSES   Final diagnoses:  Chronic diarrhea     Rx / DC Orders   ED Discharge Orders          Ordered    albuterol  (VENTOLIN  HFA) 108 (90 Base) MCG/ACT inhaler  Every 6 hours PRN        09/08/24 0840    loperamide  (IMODIUM ) 2 MG capsule  3 times daily PRN        09/08/24 0840    metroNIDAZOLE  (FLAGYL ) 500 MG tablet  2 times daily        09/08/24 0840             Note:  This document was prepared using Dragon voice recognition software and may include unintentional dictation errors.   Ernest Ronal BRAVO, MD 09/08/24 [phone]

## 2024-09-08 NOTE — ED Notes (Signed)
 Pt reports accurate phone number in chart. Phone number confirmed to be [phone].

## 2024-10-10 ENCOUNTER — Emergency Department
Admission: EM | Admit: 2024-10-10 | Discharge: 2024-10-10 | Disposition: A | Discharge status: Home / Self Care | Payer: MEDICAID | Attending: Emergency Medicine | Admitting: Emergency Medicine

## 2024-10-10 ENCOUNTER — Other Ambulatory Visit: Payer: Self-pay

## 2024-10-10 ENCOUNTER — Emergency Department: Payer: MEDICAID

## 2024-10-10 DIAGNOSIS — Z720 Tobacco use: Secondary | ICD-10-CM | POA: Insufficient documentation

## 2024-10-10 DIAGNOSIS — R519 Headache, unspecified: Secondary | ICD-10-CM | POA: Diagnosis present

## 2024-10-10 DIAGNOSIS — J449 Chronic obstructive pulmonary disease, unspecified: Secondary | ICD-10-CM | POA: Diagnosis not present

## 2024-10-10 DIAGNOSIS — I10 Essential (primary) hypertension: Secondary | ICD-10-CM | POA: Diagnosis not present

## 2024-10-10 LAB — CBC WITH DIFFERENTIAL/PLATELET
Abs Immature Granulocytes: 0.02 K/uL (ref 0.00–0.07)
Basophils Absolute: 0 K/uL (ref 0.0–0.1)
Basophils Relative: 1 %
Eosinophils Absolute: 0.1 K/uL (ref 0.0–0.5)
Eosinophils Relative: 1 %
HCT: 41.2 % (ref 39.0–52.0)
Hemoglobin: 12.7 g/dL — ABNORMAL LOW (ref 13.0–17.0)
Immature Granulocytes: 0 %
Lymphocytes Relative: 17 %
Lymphs Abs: 1.3 K/uL (ref 0.7–4.0)
MCH: 28.4 pg (ref 26.0–34.0)
MCHC: 30.8 g/dL (ref 30.0–36.0)
MCV: 92.2 fL (ref 80.0–100.0)
Monocytes Absolute: 0.5 K/uL (ref 0.1–1.0)
Monocytes Relative: 7 %
Neutro Abs: 5.7 K/uL (ref 1.7–7.7)
Neutrophils Relative %: 74 %
Platelets: 251 K/uL (ref 150–400)
RBC: 4.47 MIL/uL (ref 4.22–5.81)
RDW: 13.2 % (ref 11.5–15.5)
WBC: 7.7 K/uL (ref 4.0–10.5)
nRBC: 0 % (ref 0.0–0.2)

## 2024-10-10 LAB — BASIC METABOLIC PANEL WITH GFR
Anion gap: 9 (ref 5–15)
BUN: 5 mg/dL — ABNORMAL LOW (ref 6–20)
CO2: 25 mmol/L (ref 22–32)
Calcium: 8.8 mg/dL — ABNORMAL LOW (ref 8.9–10.3)
Chloride: 107 mmol/L (ref 98–111)
Creatinine, Ser: 0.83 mg/dL (ref 0.61–1.24)
GFR, Estimated: >60 mL/min
Glucose, Bld: 82 mg/dL (ref 70–99)
Potassium: 3.6 mmol/L (ref 3.5–5.1)
Sodium: 141 mmol/L (ref 135–145)

## 2024-10-10 MED ORDER — SODIUM CHLORIDE 0.9 % IV BOLUS: 1000.0000 mL | Freq: Once | INTRAVENOUS | Status: DC

## 2024-10-10 MED ORDER — IBUPROFEN 600 MG PO TABS
600.0000 mg | ORAL_TABLET | Freq: Once | ORAL | Status: AC
  Administered 2024-10-10: 600 mg via ORAL
  Filled 2024-10-10: qty 1

## 2024-10-10 MED ORDER — KETOROLAC TROMETHAMINE 15 MG/ML IJ SOLN
15.0000 mg | Freq: Once | INTRAMUSCULAR | Status: DC
  Filled 2024-10-10: qty 1

## 2024-10-10 NOTE — ED Provider Notes (Signed)
 "  [facility] Provider Note    Event Date/Time   First MD Initiated Contact with Patient 10/10/24 [phone]     (approximate)   History   Headache   HPI  Christian Lamb is a 49 y.o. male with a past medical history of schizophrenia, ulcerative colitis, anxiety who presents today for evaluation of headache, and chronic diarrhea.  Patient reports that he found out that his friend died yesterday and he was crying much of the day and developed a headache after crying.  He reports that his headache continued today prompting him to come in for evaluation.  He told triage that he thought he had a fever, the patient reports  I have no idea, I just figured I did, but I do not have a temperature.  He is not clear on why he believes he had a fever.  He denies any chills.  He has not taken any antipyretics today.  He denies feeling ill recently.  He denies neck pain or stiffness.  He reports that he has a history of migraine headaches which he attributes to his Invega .  He reports that today's headache feels exactly the same as his previous headaches.  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          Physical Exam   Triage Vital Signs: ED Triage Vitals  Encounter Vitals Group     BP 10/10/24 0707 122/89     Girls Systolic BP Percentile --      Girls Diastolic BP Percentile --      Boys Systolic BP Percentile --      Boys  Diastolic BP Percentile --      Pulse Rate 10/10/24 0707 (!) 108     Resp 10/10/24 0707 18     Temp 10/10/24 0707 98.6 F (37 C)     Temp src --      SpO2 10/10/24 0707 97 %     Weight 10/10/24 0706 135 lb (61.2 kg)     Height 10/10/24 0706 6' 2 (1.88 m)     Head Circumference --      Peak Flow --      Pain Score 10/10/24 0706 8     Pain Loc --      Pain Education --      Exclude from Growth Chart --     Most recent vital signs: Vitals:   10/10/24 0707 10/10/24 0842  BP: 122/89 120/80  Pulse: (!) 108 88  Resp: 18 18  Temp: 98.6 F (37 C)   SpO2: 97% 98%    Physical Exam Vitals and nursing note reviewed.  Constitutional:      General: Awake and alert. No acute distress.    Appearance: Normal appearance. The patient is normal weight.  HENT:     Head: Normocephalic and atraumatic.     Mouth: Mucous membranes are moist.  Eyes:     General: PERRL. Normal EOMs  Right eye: No discharge.        Left eye: No discharge.     Conjunctiva/sclera: Conjunctivae normal.  Cardiovascular:     Rate and Rhythm: Normal rate and regular rhythm.     Pulses: Normal pulses.  Pulmonary:     Effort: Pulmonary effort is normal. No respiratory distress.  Able to speak easily in complete sentences    Breath sounds: Normal breath sounds.  Abdominal:     Abdomen is soft. There is no abdominal tenderness. No rebound or guarding. No distention. Musculoskeletal:        General: No swelling. Normal range of motion.     Cervical back: Normal range of motion and neck supple.  No nuchal rigidity Skin:    General: Skin is warm and dry.     Capillary Refill: Capillary refill takes less than 2 seconds.     Findings: No rash.  Neurological:     Mental Status: The patient is awake and alert.   Neurological: GCS 15 alert and oriented x3 Normal speech, no expressive or receptive aphasia or dysarthria Cranial nerves II through XII intact Normal visual fields 5 out of 5 strength in all 4  extremities with intact sensation throughout No extremity drift Normal finger-to-nose testing, no limb or truncal ataxia    ED Results / Procedures / Treatments   Labs (all labs ordered are listed, but only abnormal results are displayed) Labs Reviewed  BASIC METABOLIC PANEL WITH GFR - Abnormal; Notable for the following components:      Result Value   BUN 5 (*)    Calcium 8.8 (*)    All other components within normal limits  CBC WITH DIFFERENTIAL/PLATELET - Abnormal; Notable for the following components:   Hemoglobin 12.7 (*)    All other components within normal limits     EKG     RADIOLOGY I independently reviewed and interpreted imaging and agree with radiologists findings.     PROCEDURES:  Critical Care performed:   Procedures   MEDICATIONS ORDERED IN ED: Medications  sodium chloride  0.9 % bolus 1,000 mL (1,000 mLs Intravenous Not Given 10/10/24 0820)  ibuprofen  (ADVIL ) tablet 600 mg (600 mg Oral Given 10/10/24 0813)     IMPRESSION / MDM / ASSESSMENT AND PLAN / ED COURSE  I reviewed the triage vital signs and the nursing notes.   Differential diagnosis includes, but is not limited to, tension headache, dehydration, migraine headache, viral illness  Patient is awake and alert, tachycardic to 108 though normotensive and afebrile without taking any antipyretics.  He has no nuchal rigidity, he has full normal and painless range of motion of his neck, not consistent with meningitis.  No altered mental status.  Patient does have a psychiatric history.  Denies IV drug use, no constitutional symptoms to suggest infectious etiology.  I do not believe that he is the most reliable historian, therefore we will obtain CT head to evaluate for underlying etiology of his headache.  Labs are also obtained.  He was treated with Toradol  and IV fluids.  He requested a chest x-ray because of his COPD, reports that occasionally he feels short of breath but not currently.  Labs are  overall reassuring.  CT revealed no acute findings, and x-ray of his chest reveals chronic COPD findings, though no acute abnormality.  He declined the Toradol  and IV fluids, and was given Motrin  instead.  Upon reevaluation he reported that his headache had resolved entirely.  He is reassured by his workup  today.  We discussed return precautions and outpatient follow-up.  Patient understands and agrees with plan.  He was discharged in stable condition.   Patient's presentation is most consistent with acute complicated illness / injury requiring diagnostic workup.   Clinical Course as of 10/10/24 1123  Sun Oct 10, 2024  9160 Patient reports headache has resolved [JP]    Clinical Course User Index [JP] Synda Bagent E, PA-C     FINAL CLINICAL IMPRESSION(S) / ED DIAGNOSES   Final diagnoses:  Acute nonintractable headache, unspecified headache type     Rx / DC Orders   ED Discharge Orders     None        Note:  This document was prepared using Dragon voice recognition software and may include unintentional dictation errors.   Al Bracewell E, PA-C 10/10/24 1123  "

## 2024-10-10 NOTE — ED Notes (Signed)
 See triage note  Presents with fever and headache for 2 days Presents afebrile

## 2024-10-10 NOTE — Discharge Instructions (Signed)
 Please follow-up with your outpatient provider.  Please return for any new, worsening, or changing symptoms or other concerns.  It was a pleasure caring for you today.

## 2024-10-10 NOTE — ED Triage Notes (Signed)
 Pt comes via EMS from home with c/o headache and fever for last two days. Pt states 8/10 pain. Pt denies using any otc meds.

## 2024-10-18 ENCOUNTER — Emergency Department
Admission: EM | Admit: 2024-10-18 | Discharge: 2024-10-18 | Disposition: A | Discharge status: Home / Self Care | Payer: MEDICAID | Attending: Emergency Medicine | Admitting: Emergency Medicine

## 2024-10-18 ENCOUNTER — Encounter: Payer: Self-pay | Admitting: Emergency Medicine

## 2024-10-18 ENCOUNTER — Other Ambulatory Visit: Payer: Self-pay

## 2024-10-18 ENCOUNTER — Emergency Department: Payer: MEDICAID

## 2024-10-18 DIAGNOSIS — J449 Chronic obstructive pulmonary disease, unspecified: Secondary | ICD-10-CM | POA: Diagnosis not present

## 2024-10-18 DIAGNOSIS — R062 Wheezing: Secondary | ICD-10-CM | POA: Insufficient documentation

## 2024-10-18 DIAGNOSIS — Z720 Tobacco use: Secondary | ICD-10-CM | POA: Insufficient documentation

## 2024-10-18 DIAGNOSIS — R059 Cough, unspecified: Secondary | ICD-10-CM | POA: Diagnosis not present

## 2024-10-18 DIAGNOSIS — Z7952 Long term (current) use of systemic steroids: Secondary | ICD-10-CM | POA: Diagnosis not present

## 2024-10-18 LAB — CBC
HCT: 43.3 % (ref 39.0–52.0)
Hemoglobin: 13.4 g/dL (ref 13.0–17.0)
MCH: 28.6 pg (ref 26.0–34.0)
MCHC: 30.9 g/dL (ref 30.0–36.0)
MCV: 92.5 fL (ref 80.0–100.0)
Platelets: 260 K/uL (ref 150–400)
RBC: 4.68 MIL/uL (ref 4.22–5.81)
RDW: 12.8 % (ref 11.5–15.5)
WBC: 8.7 K/uL (ref 4.0–10.5)
nRBC: 0 % (ref 0.0–0.2)

## 2024-10-18 LAB — COMPREHENSIVE METABOLIC PANEL WITH GFR
ALT: 8 U/L (ref 0–44)
AST: 15 U/L (ref 15–41)
Albumin: 3.8 g/dL (ref 3.5–5.0)
Alkaline Phosphatase: 101 U/L (ref 38–126)
Anion gap: 12 (ref 5–15)
BUN: 12 mg/dL (ref 6–20)
CO2: 21 mmol/L — ABNORMAL LOW (ref 22–32)
Calcium: 8.8 mg/dL — ABNORMAL LOW (ref 8.9–10.3)
Chloride: 108 mmol/L (ref 98–111)
Creatinine, Ser: 0.97 mg/dL (ref 0.61–1.24)
GFR, Estimated: >60 mL/min
Glucose, Bld: 94 mg/dL (ref 70–99)
Potassium: 3.5 mmol/L (ref 3.5–5.1)
Sodium: 141 mmol/L (ref 135–145)
Total Bilirubin: 0.6 mg/dL (ref 0.0–1.2)
Total Protein: 8.8 g/dL — ABNORMAL HIGH (ref 6.5–8.1)

## 2024-10-18 LAB — LIPASE, BLOOD: Lipase: 27 U/L (ref 11–51)

## 2024-10-18 MED ORDER — PREDNISONE 20 MG PO TABS
40.0000 mg | ORAL_TABLET | Freq: Once | ORAL | Status: AC
  Administered 2024-10-18: 40 mg via ORAL
  Filled 2024-10-18: qty 2

## 2024-10-18 MED ORDER — PREDNISONE 20 MG PO TABS: 40.0000 mg | ORAL_TABLET | Freq: Every day | ORAL | 0 refills | Status: AC

## 2024-10-18 MED ORDER — ALBUTEROL SULFATE HFA 108 (90 BASE) MCG/ACT IN AERS: 2.0000 | INHALATION_SPRAY | Freq: Four times a day (QID) | RESPIRATORY_TRACT | 2 refills | Status: AC | PRN

## 2024-10-18 MED ORDER — ALBUTEROL SULFATE HFA 108 (90 BASE) MCG/ACT IN AERS: 2.0000 | INHALATION_SPRAY | Freq: Four times a day (QID) | RESPIRATORY_TRACT | 0 refills | Status: AC | PRN

## 2024-10-18 NOTE — Discharge Instructions (Signed)
 Please take prednisone  as prescribed daily for 4 additional days.  Use albuterol  inhaler 1 to 2 puffs every 6 hours as needed for any wheezing.  Return to the ER for any fevers increasing pain worsening symptoms or any urgent changes in your health

## 2024-10-18 NOTE — ED Notes (Signed)
 AVS provided by edp was reviewed with pt. Pt verbalized understanding with no additional questions at this time.

## 2024-10-18 NOTE — ED Provider Notes (Signed)
 " [place] [facility] Provider Note   CSN: [id number] Arrival date & time: 10/18/24  1710     Patient presents with: Diarrhea   Christian Lamb is a 49 y.o. male with history of schizoaffective disorder, anxiety paranoia COPD smoking presents to the emergency department for evaluation of cough, wheezing.  He has had several weeks of a mild cough, reports some wheezing has albuterol  inhaler at home, thinks it is in his drawer but has not been using it.  He denies any fevers, productive cough, chest pain, shortness of breath.  Has had some reported diarrhea but this has resolved.      Prior to Admission medications  Medication Sig Start Date End Date Taking? Authorizing Provider  albuterol  (VENTOLIN  HFA) 108 (90 Base) MCG/ACT inhaler Inhale 2 puffs into the lungs every 6 (six) hours as needed for wheezing or shortness of breath. 10/18/24  Yes Charlene Debby BROCKS, PA-C  predniSONE  (DELTASONE ) 20 MG tablet Take 2 tablets (40 mg total) by mouth daily with breakfast for 4 days. 10/18/24 10/22/24 Yes Charlene Debby BROCKS, PA-C  acetaminophen  (TYLENOL ) 500 MG tablet Take 500 mg by mouth every 6 (six) hours as needed.    [provider]  albuterol  (VENTOLIN  HFA) 108 (90 Base) MCG/ACT inhaler Inhale 2 puffs into the lungs every 6 (six) hours as needed for wheezing or shortness of breath. 10/18/24 11/17/24  Charlene Debby BROCKS, PA-C  Cholecalciferol 100 MCG (4000 UT) CAPS Take by mouth.    [provider]  ferrous sulfate 324 MG TBEC Take 324 mg by mouth.    [provider]  liver oil-zinc  oxide (DESITIN) 40 % ointment Apply 1 Application topically as needed for irritation. 11/18/23   Bernardo Fend, DO  loperamide  (IMODIUM ) 2 MG capsule Take 1 capsule (2 mg total) by mouth 3 (three) times daily as needed for diarrhea or loose stools. 09/08/24   Ernest Ronal BRAVO, MD  LORazepam  (ATIVAN ) 1 MG tablet Take 1 mg by mouth at bedtime.    [provider]   metoprolol  succinate (TOPROL -XL) 100 MG 24 hr tablet Take 100 mg by mouth daily.    [provider]  mirtazapine  (REMERON ) 15 MG tablet Take 1 tablet (15 mg total) by mouth at bedtime. 11/03/19   Rancour, Garnette, MD  Multiple Vitamin (MULTI-VITAMIN) tablet Take 1 tablet by mouth daily.    [provider]  mupirocin ointment (BACTROBAN) 2 % Apply topically. 02/13/23   [provider]  Nutritional Supplements (CARNATION BREAKFAST ESSENTIALS PO) Take by mouth.    [provider]  OLANZapine  (ZYPREXA ) 5 MG tablet Take 5 mg by mouth.    [provider]  oxybutynin (DITROPAN XL) 15 MG 24 hr tablet Take 15 mg by mouth daily.    [provider]  paliperidone  (INVEGA  SUSTENNA) 234 MG/1.5ML injection Inject 234 mg into the muscle every 28 (twenty-eight) days. 11/18/23   Bernardo Fend, DO  sertraline (ZOLOFT) 25 MG tablet Take 25 mg by mouth daily.    [provider]  SYMBICORT 160-4.5 MCG/ACT inhaler Inhale 2 puffs into the lungs 2 (two) times daily.    [provider]  tolnaftate (TINACTIN) 1 % cream Apply 1 Application topically 2 (two) times daily.    [provider]    Allergies: Tegretol [carbamazepine]    Review of Systems  Updated Vital Signs BP (!) 105/91   Pulse 88   Temp 97.8 F (36.6 C) (Oral)   Resp 18  Ht 6' 2 (1.88 m)   Wt 61 kg   SpO2 100%   BMI 17.27 kg/m   Physical Exam Constitutional:      Appearance: He is well-developed.  HENT:     Head: Normocephalic and atraumatic.     Right Ear: External ear normal.     Left Ear: External ear normal.     Nose: Nose normal.     Mouth/Throat:     Pharynx: No oropharyngeal exudate or posterior oropharyngeal erythema.  Eyes:     Conjunctiva/sclera: Conjunctivae normal.  Cardiovascular:     Rate and Rhythm: Normal rate.     Pulses: Normal pulses.     Heart sounds: Normal heart sounds.  Pulmonary:     Effort: Pulmonary effort is normal. No  respiratory distress.     Breath sounds: Wheezing present.     Comments: Subtle intermittent expiratory wheezing, appears comfortable.  No distress.  Normal O2 sats and heart rate. Abdominal:     General: Bowel sounds are normal. There is no distension.     Tenderness: There is no abdominal tenderness. There is no right CVA tenderness, left CVA tenderness or guarding.  Musculoskeletal:        General: Normal range of motion.     Cervical back: Normal range of motion.  Skin:    General: Skin is warm.     Findings: No rash.  Neurological:     General: No focal deficit present.     Mental Status: He is alert and oriented to person, place, and time. Mental status is at baseline.  Psychiatric:        Behavior: Behavior normal.        Thought Content: Thought content normal.     (all labs ordered are listed, but only abnormal results are displayed) Labs Reviewed  COMPREHENSIVE METABOLIC PANEL WITH GFR - Abnormal; Notable for the following components:      Result Value   CO2 21 (*)    Calcium 8.8 (*)    Total Protein 8.8 (*)    All other components within normal limits  LIPASE, BLOOD  CBC  URINALYSIS, ROUTINE W REFLEX MICROSCOPIC    EKG: None  Radiology: DG Chest 2 View Result Date: 10/18/2024 CLINICAL DATA:  Short of breath, diarrhea EXAM: CHEST - 2 VIEW COMPARISON:  10/10/2024 FINDINGS: The heart size and mediastinal contours are within normal limits. Both lungs are clear. The visualized skeletal structures are unremarkable. IMPRESSION: No active cardiopulmonary disease. Electronically Signed   By: Ozell Daring M.D.   On: 10/18/2024 21:06     Procedures   Medications Ordered in the ED  predniSONE  (DELTASONE ) tablet 40 mg (has no administration in time range)                                    Medical Decision Making Amount and/or Complexity of Data Reviewed Labs: ordered.  Risk Prescription drug management.   49 year old male with history of schizophrenia,  COPD, tobacco user presents with several weeks of a mild cough and some reported wheezing.  Wheezing is very mild on exam.  He is in no respiratory distress.  He has good air movement bilaterally.  Normal O2 sats and heart rate.  Encouraged him to quit smoking.  He has been cutting back.  He has an albuterol  inhaler at home that he has not been using but we will give him a prescription  for a new one today and we will also start him on prednisone  40 mg daily for 5 days.  He reported some diarrhea earlier but this has resolved.  He is does not have any abdominal pain or fevers.  Overall he appears well and comfortable Final diagnoses:  Wheezing    ED Discharge Orders          Ordered    predniSONE  (DELTASONE ) 20 MG tablet  Daily with breakfast        10/18/24 2142    albuterol  (VENTOLIN  HFA) 108 (90 Base) MCG/ACT inhaler  Every 6 hours PRN        10/18/24 2142    albuterol  (VENTOLIN  HFA) 108 (90 Base) MCG/ACT inhaler  Every 6 hours PRN        10/18/24 2142               Sriya Kroeze C, PA-C 10/18/24 2150    Waymond Lorelle Cummins, MD 10/18/24 2345  "

## 2024-10-18 NOTE — ED Triage Notes (Signed)
 Pt reports congestion, SOB, diarrhea, phlegm. States he has been coming for over a year for the same problem.

## 2024-11-07 ENCOUNTER — Emergency Department
Admission: EM | Admit: 2024-11-07 | Discharge: 2024-11-07 | Disposition: A | Discharge status: Home / Self Care | Payer: MEDICAID | Attending: Emergency Medicine | Admitting: Emergency Medicine

## 2024-11-07 DIAGNOSIS — R222 Localized swelling, mass and lump, trunk: Secondary | ICD-10-CM | POA: Diagnosis present

## 2024-11-07 NOTE — ED Triage Notes (Signed)
 Pt. In via EMS for bump on right butt cheek that he thinks is causing him to have diarrhea, states bump has been there since 2023

## 2024-11-07 NOTE — ED Provider Notes (Signed)
 [facility] Provider Note    Event Date/Time   First MD Initiated Contact with Patient 11/07/24 1703     (approximate)   History   Abscess   HPI  Christian Lamb is a 49 y.o. male history of schizoaffective disorder, presenting with nodule to his right butt cheek.  States that has been there for several years.  States that he has some diarrhea but no abdominal pain or nausea or vomiting.  No fever.  Was wondering if the nodule was causing the diarrhea.     Physical Exam   Triage Vital Signs: ED Triage Vitals  Encounter Vitals Group     BP 11/07/24 1700 121/84     Girls Systolic BP Percentile --      Girls Diastolic BP Percentile --      Boys Systolic BP Percentile --      Boys Diastolic BP Percentile --      Pulse Rate 11/07/24 1700 89     Resp 11/07/24 1700 18     Temp 11/07/24 1700 98.1 F (36.7 C)     Temp Source 11/07/24 1700 Oral     SpO2 11/07/24 1700 99 %     Weight 11/07/24 1652 134 lb 7.7 oz (61 kg)     Height 11/07/24 1652 6' 2 (1.88 m)     Head Circumference --      Peak Flow --      Pain Score 11/07/24 1652 4     Pain Loc --      Pain Education --      Exclude from Growth Chart --     Most recent vital signs: Vitals:   11/07/24 1700  BP: 121/84  Pulse: 89  Resp: 18  Temp: 98.1 F (36.7 C)  SpO2: 99%     General: Awake, no distress.  CV:  Good peripheral perfusion.  Resp:  Normal effort.  Abd:  No distention.  Soft nontender Other:  External rectal exam was done as chaperone, he has 2 palpable nodules to his right medial butt cheek, no overlying swelling, no erythema or induration, no fluctuance, no drainage   ED Results / Procedures / Treatments   Labs (all labs ordered are listed, but only abnormal results are displayed) Labs Reviewed - No data to display     PROCEDURES:  Critical Care performed: No  Procedures   MEDICATIONS ORDERED IN ED: Medications - No data to display   IMPRESSION / MDM  / ASSESSMENT AND PLAN / ED COURSE  I reviewed the triage vital signs and the nursing notes.                              Differential diagnosis includes, but is not limited to, scar tissue, nodule, no evidence of infection, suspect the diarrhea may be viral, is no abdominal pain at this time to suggest colitis or diverticulitis.  Will give him a number for surgery to follow-up, I also instructed follow-up primary care, he is agreeable with this plan.  Considered but no indication for inpatient admission at this time, he safe for outpatient management.  Will discharge with strict return precautions.  Patient's presentation is most consistent with acute illness / injury with system symptoms.        FINAL CLINICAL IMPRESSION(S) / ED DIAGNOSES   Final diagnoses:  Nodule of buttock     Rx / DC Orders   ED Discharge  Orders     None        Note:  This document was prepared using Dragon voice recognition software and may include unintentional dictation errors.    Waymond Lorelle Cummins, MD 11/07/24 [phone]

## 2024-11-18 ENCOUNTER — Ambulatory Visit: Payer: MEDICAID | Admitting: Internal Medicine
